# Patient Record
Sex: Female | Born: 1935 | Race: White | Hispanic: No | Smoking: Never smoker
Health system: Southern US, Community
[De-identification: ages and names within clinical notes are randomized; demographics above are authoritative.]

## PROBLEM LIST (undated history)

## (undated) DIAGNOSIS — K921 Melena: Secondary | ICD-10-CM

## (undated) DIAGNOSIS — J301 Allergic rhinitis due to pollen: Secondary | ICD-10-CM

## (undated) DIAGNOSIS — M1990 Unspecified osteoarthritis, unspecified site: Secondary | ICD-10-CM

## (undated) DIAGNOSIS — F32A Depression, unspecified: Secondary | ICD-10-CM

## (undated) DIAGNOSIS — I8393 Asymptomatic varicose veins of bilateral lower extremities: Secondary | ICD-10-CM

## (undated) DIAGNOSIS — K5792 Diverticulitis of intestine, part unspecified, without perforation or abscess without bleeding: Secondary | ICD-10-CM

## (undated) DIAGNOSIS — F329 Major depressive disorder, single episode, unspecified: Secondary | ICD-10-CM

## (undated) DIAGNOSIS — I519 Heart disease, unspecified: Secondary | ICD-10-CM

## (undated) DIAGNOSIS — C801 Malignant (primary) neoplasm, unspecified: Secondary | ICD-10-CM

## (undated) DIAGNOSIS — E785 Hyperlipidemia, unspecified: Secondary | ICD-10-CM

## (undated) DIAGNOSIS — K219 Gastro-esophageal reflux disease without esophagitis: Secondary | ICD-10-CM

## (undated) DIAGNOSIS — B019 Varicella without complication: Secondary | ICD-10-CM

## (undated) DIAGNOSIS — R011 Cardiac murmur, unspecified: Secondary | ICD-10-CM

## (undated) DIAGNOSIS — N390 Urinary tract infection, site not specified: Secondary | ICD-10-CM

## (undated) DIAGNOSIS — K635 Polyp of colon: Secondary | ICD-10-CM

## (undated) HISTORY — DX: Melena: K92.1

## (undated) HISTORY — DX: Hyperlipidemia, unspecified: E78.5

## (undated) HISTORY — PX: KNEE SURGERY: SHX244

## (undated) HISTORY — DX: Gastro-esophageal reflux disease without esophagitis: K21.9

## (undated) HISTORY — DX: Cardiac murmur, unspecified: R01.1

## (undated) HISTORY — DX: Varicella without complication: B01.9

## (undated) HISTORY — DX: Malignant (primary) neoplasm, unspecified: C80.1

## (undated) HISTORY — DX: Heart disease, unspecified: I51.9

## (undated) HISTORY — DX: Urinary tract infection, site not specified: N39.0

## (undated) HISTORY — DX: Diverticulitis of intestine, part unspecified, without perforation or abscess without bleeding: K57.92

## (undated) HISTORY — DX: Polyp of colon: K63.5

## (undated) HISTORY — PX: HERNIA REPAIR: SHX51

## (undated) HISTORY — DX: Unspecified osteoarthritis, unspecified site: M19.90

## (undated) HISTORY — DX: Allergic rhinitis due to pollen: J30.1

## (undated) HISTORY — DX: Depression, unspecified: F32.A

## (undated) HISTORY — DX: Major depressive disorder, single episode, unspecified: F32.9

---

## 2004-06-16 ENCOUNTER — Other Ambulatory Visit: Payer: Self-pay

## 2004-09-29 ENCOUNTER — Ambulatory Visit: Payer: Self-pay | Admitting: Internal Medicine

## 2005-12-06 ENCOUNTER — Ambulatory Visit: Payer: Self-pay | Admitting: Internal Medicine

## 2006-12-22 ENCOUNTER — Ambulatory Visit: Payer: Self-pay | Admitting: Internal Medicine

## 2007-04-04 ENCOUNTER — Ambulatory Visit: Payer: Self-pay | Admitting: Orthopedic Surgery

## 2007-06-13 ENCOUNTER — Ambulatory Visit: Payer: Self-pay | Admitting: Orthopedic Surgery

## 2007-06-21 ENCOUNTER — Inpatient Hospital Stay: Payer: Self-pay | Admitting: Orthopedic Surgery

## 2007-06-28 ENCOUNTER — Encounter: Payer: Self-pay | Admitting: Internal Medicine

## 2007-07-19 ENCOUNTER — Inpatient Hospital Stay: Payer: Self-pay | Admitting: Internal Medicine

## 2007-07-19 ENCOUNTER — Other Ambulatory Visit: Payer: Self-pay

## 2007-08-09 ENCOUNTER — Encounter: Payer: Self-pay | Admitting: Orthopedic Surgery

## 2007-08-26 ENCOUNTER — Encounter: Payer: Self-pay | Admitting: Orthopedic Surgery

## 2007-09-25 ENCOUNTER — Encounter: Payer: Self-pay | Admitting: Orthopedic Surgery

## 2010-01-26 ENCOUNTER — Emergency Department: Payer: Self-pay | Admitting: Emergency Medicine

## 2010-02-06 ENCOUNTER — Ambulatory Visit: Payer: Self-pay | Admitting: Internal Medicine

## 2010-04-06 ENCOUNTER — Ambulatory Visit: Payer: Self-pay | Admitting: Surgery

## 2010-04-13 ENCOUNTER — Ambulatory Visit: Payer: Self-pay | Admitting: Surgery

## 2010-04-16 ENCOUNTER — Encounter: Payer: Self-pay | Admitting: Internal Medicine

## 2010-04-24 ENCOUNTER — Encounter: Payer: Self-pay | Admitting: Internal Medicine

## 2011-12-13 ENCOUNTER — Ambulatory Visit: Payer: Self-pay | Admitting: Internal Medicine

## 2011-12-24 ENCOUNTER — Ambulatory Visit: Payer: Self-pay | Admitting: Internal Medicine

## 2011-12-24 LAB — RETICULOCYTES: Reticulocyte: 2.2 % — ABNORMAL HIGH (ref 0.5–1.5)

## 2011-12-24 LAB — CBC CANCER CENTER
Basophil %: 0.8 %
Eosinophil %: 4.5 %
HCT: 27.8 % — ABNORMAL LOW (ref 35.0–47.0)
HGB: 8.8 g/dL — ABNORMAL LOW (ref 12.0–16.0)
MCH: 21 pg — ABNORMAL LOW (ref 26.0–34.0)
MCV: 67 fL — ABNORMAL LOW (ref 80–100)
Monocyte #: 0.7 x10 3/mm (ref 0.0–0.7)
Monocyte %: 9.8 %
RBC: 4.16 10*6/uL (ref 3.80–5.20)

## 2011-12-24 LAB — IRON AND TIBC
Iron Bind.Cap.(Total): 483 ug/dL — ABNORMAL HIGH (ref 250–450)
Iron: 19 ug/dL — ABNORMAL LOW (ref 50–170)
Unbound Iron-Bind.Cap.: 464 ug/dL

## 2011-12-24 LAB — CREATININE, SERUM
Creatinine: 0.98 mg/dL (ref 0.60–1.30)
EGFR (Non-African Amer.): 59 — ABNORMAL LOW

## 2011-12-24 LAB — FERRITIN: Ferritin (ARMC): 12 ng/mL (ref 8–388)

## 2012-01-24 ENCOUNTER — Ambulatory Visit: Payer: Self-pay | Admitting: Internal Medicine

## 2012-02-04 LAB — CANCER CENTER HEMOGLOBIN: HGB: 11.6 g/dL — ABNORMAL LOW (ref 12.0–16.0)

## 2012-02-23 ENCOUNTER — Ambulatory Visit: Payer: Self-pay | Admitting: Internal Medicine

## 2012-02-25 LAB — CANCER CENTER HEMOGLOBIN: HGB: 11 g/dL — ABNORMAL LOW (ref 12.0–16.0)

## 2012-03-15 LAB — CANCER CENTER HEMOGLOBIN: HGB: 9 g/dL — ABNORMAL LOW (ref 12.0–16.0)

## 2012-03-22 LAB — CANCER CENTER HEMOGLOBIN: HGB: 9.7 g/dL — ABNORMAL LOW (ref 12.0–16.0)

## 2012-03-25 ENCOUNTER — Ambulatory Visit: Payer: Self-pay | Admitting: Internal Medicine

## 2012-05-04 ENCOUNTER — Ambulatory Visit: Payer: Self-pay | Admitting: Internal Medicine

## 2012-05-04 LAB — CANCER CENTER HEMOGLOBIN: HGB: 9.5 g/dL — ABNORMAL LOW (ref 12.0–16.0)

## 2012-05-15 LAB — CANCER CENTER HEMOGLOBIN: HGB: 10.5 g/dL — ABNORMAL LOW (ref 12.0–16.0)

## 2012-05-22 LAB — CANCER CENTER HEMOGLOBIN: HGB: 11.7 g/dL — ABNORMAL LOW (ref 12.0–16.0)

## 2012-05-25 ENCOUNTER — Ambulatory Visit: Payer: Self-pay | Admitting: Internal Medicine

## 2012-06-19 LAB — FERRITIN: Ferritin (ARMC): 55 ng/mL (ref 8–388)

## 2012-06-25 ENCOUNTER — Ambulatory Visit: Payer: Self-pay | Admitting: Internal Medicine

## 2012-07-03 LAB — FERRITIN: Ferritin (ARMC): 33 ng/mL (ref 8–388)

## 2012-07-25 ENCOUNTER — Ambulatory Visit: Payer: Self-pay | Admitting: Internal Medicine

## 2012-08-14 LAB — URINALYSIS, COMPLETE
Glucose,UR: NEGATIVE mg/dL (ref 0–75)
Ketone: NEGATIVE
Nitrite: NEGATIVE
Specific Gravity: 1.011 (ref 1.003–1.030)
Squamous Epithelial: <1
WBC UR: 912 /HPF (ref 0–5)

## 2012-08-14 LAB — CANCER CENTER HEMOGLOBIN: HGB: 11.9 g/dL — ABNORMAL LOW (ref 12.0–16.0)

## 2012-08-16 LAB — URINE CULTURE

## 2012-08-25 ENCOUNTER — Ambulatory Visit: Payer: Self-pay | Admitting: Internal Medicine

## 2012-09-15 LAB — FERRITIN: Ferritin (ARMC): 25 ng/mL (ref 8–388)

## 2012-09-24 ENCOUNTER — Ambulatory Visit: Payer: Self-pay | Admitting: Internal Medicine

## 2012-10-30 ENCOUNTER — Ambulatory Visit: Payer: Self-pay | Admitting: Internal Medicine

## 2012-10-30 LAB — CANCER CENTER HEMOGLOBIN: HGB: 10 g/dL — ABNORMAL LOW (ref 12.0–16.0)

## 2012-11-09 LAB — CANCER CENTER HEMOGLOBIN: HGB: 12 g/dL (ref 12.0–16.0)

## 2012-11-25 ENCOUNTER — Ambulatory Visit: Payer: Self-pay | Admitting: Internal Medicine

## 2012-11-27 LAB — CANCER CENTER HEMOGLOBIN: HGB: 12.2 g/dL (ref 12.0–16.0)

## 2012-12-11 LAB — FERRITIN: Ferritin (ARMC): 41 ng/mL (ref 8–388)

## 2012-12-23 ENCOUNTER — Ambulatory Visit: Payer: Self-pay | Admitting: Internal Medicine

## 2012-12-30 ENCOUNTER — Emergency Department: Payer: Self-pay | Admitting: Emergency Medicine

## 2012-12-30 LAB — CBC
HCT: 33.9 % — ABNORMAL LOW (ref 35.0–47.0)
HGB: 11.3 g/dL — ABNORMAL LOW (ref 12.0–16.0)
MCH: 28.7 pg (ref 26.0–34.0)
RBC: 3.95 10*6/uL (ref 3.80–5.20)
RDW: 14.9 % — ABNORMAL HIGH (ref 11.5–14.5)
WBC: 9.7 10*3/uL (ref 3.6–11.0)

## 2012-12-30 LAB — COMPREHENSIVE METABOLIC PANEL
Albumin: 3.6 g/dL (ref 3.4–5.0)
Alkaline Phosphatase: 62 U/L (ref 50–136)
Anion Gap: 9 (ref 7–16)
BUN: 18 mg/dL (ref 7–18)
Calcium, Total: 9 mg/dL (ref 8.5–10.1)
Chloride: 103 mmol/L (ref 98–107)
Co2: 26 mmol/L (ref 21–32)
EGFR (African American): 58 — ABNORMAL LOW
EGFR (Non-African Amer.): 50 — ABNORMAL LOW
Potassium: 3.6 mmol/L (ref 3.5–5.1)
SGOT(AST): 27 U/L (ref 15–37)
SGPT (ALT): 22 U/L (ref 12–78)
Total Protein: 6.8 g/dL (ref 6.4–8.2)

## 2013-01-08 LAB — CANCER CENTER HEMOGLOBIN: HGB: 9.3 g/dL — ABNORMAL LOW (ref 12.0–16.0)

## 2013-01-08 LAB — RETICULOCYTES
Absolute Retic Count: 0.1901 10*6/uL — ABNORMAL HIGH
Reticulocyte: 5.76 % — ABNORMAL HIGH

## 2013-01-08 LAB — FERRITIN: Ferritin (ARMC): 47 ng/mL (ref 8–388)

## 2013-01-10 LAB — CBC CANCER CENTER
Eosinophil #: 0.4 x10 3/mm (ref 0.0–0.7)
Eosinophil %: 4 %
HCT: 27.8 % — ABNORMAL LOW (ref 35.0–47.0)
HGB: 9.1 g/dL — ABNORMAL LOW (ref 12.0–16.0)
Lymphocyte #: 1.6 x10 3/mm (ref 1.0–3.6)
MCHC: 32.8 g/dL (ref 32.0–36.0)
MCV: 86 fL (ref 80–100)
Monocyte %: 10.6 %
Neutrophil #: 6.4 x10 3/mm (ref 1.4–6.5)
Platelet: 258 x10 3/mm (ref 150–440)
RBC: 3.23 10*6/uL — ABNORMAL LOW (ref 3.80–5.20)
RDW: 15.3 % — ABNORMAL HIGH (ref 11.5–14.5)

## 2013-01-15 LAB — CANCER CENTER HEMOGLOBIN: HGB: 10.5 g/dL — ABNORMAL LOW (ref 12.0–16.0)

## 2013-01-23 ENCOUNTER — Ambulatory Visit: Payer: Self-pay | Admitting: Internal Medicine

## 2013-01-24 LAB — CANCER CENTER HEMOGLOBIN: HGB: 10.7 g/dL — ABNORMAL LOW (ref 12.0–16.0)

## 2013-02-05 LAB — CANCER CENTER HEMOGLOBIN: HGB: 11.5 g/dL — ABNORMAL LOW (ref 12.0–16.0)

## 2013-02-05 LAB — FERRITIN: Ferritin (ARMC): 71 ng/mL (ref 8–388)

## 2013-02-20 ENCOUNTER — Inpatient Hospital Stay: Payer: Self-pay | Admitting: Internal Medicine

## 2013-02-20 LAB — COMPREHENSIVE METABOLIC PANEL
Anion Gap: 8 (ref 7–16)
BUN: 12 mg/dL (ref 7–18)
Bilirubin,Total: 0.4 mg/dL (ref 0.2–1.0)
Chloride: 103 mmol/L (ref 98–107)
Co2: 29 mmol/L (ref 21–32)
Creatinine: 0.98 mg/dL (ref 0.60–1.30)
Glucose: 110 mg/dL — ABNORMAL HIGH (ref 65–99)
Osmolality: 280 (ref 275–301)
Potassium: 4 mmol/L (ref 3.5–5.1)
SGOT(AST): 19 U/L (ref 15–37)
SGPT (ALT): 20 U/L (ref 12–78)
Sodium: 140 mmol/L (ref 136–145)

## 2013-02-20 LAB — PROTIME-INR: Prothrombin Time: 13 secs (ref 11.5–14.7)

## 2013-02-20 LAB — FERRITIN: Ferritin (ARMC): 39 ng/mL (ref 8–388)

## 2013-02-21 LAB — HEMOGLOBIN: HGB: 10 g/dL — ABNORMAL LOW (ref 12.0–16.0)

## 2013-02-22 ENCOUNTER — Ambulatory Visit: Payer: Self-pay | Admitting: Internal Medicine

## 2013-02-23 LAB — HEMOGLOBIN
HGB: 9.5 g/dL — ABNORMAL LOW (ref 12.0–16.0)
HGB: 10 g/dL — ABNORMAL LOW (ref 12.0–16.0)

## 2013-02-23 LAB — PATHOLOGY REPORT

## 2013-03-05 LAB — CANCER CENTER HEMOGLOBIN: HGB: 11.5 g/dL — ABNORMAL LOW (ref 12.0–16.0)

## 2013-03-15 LAB — FERRITIN: Ferritin (ARMC): 123 ng/mL (ref 8–388)

## 2013-03-25 ENCOUNTER — Ambulatory Visit: Payer: Self-pay | Admitting: Internal Medicine

## 2013-03-27 ENCOUNTER — Ambulatory Visit: Payer: Self-pay | Admitting: Ophthalmology

## 2013-03-29 LAB — CANCER CENTER HEMOGLOBIN: HGB: 12.3 g/dL (ref 12.0–16.0)

## 2013-04-12 LAB — CANCER CENTER HEMOGLOBIN: HGB: 12.6 g/dL (ref 12.0–16.0)

## 2013-04-24 ENCOUNTER — Ambulatory Visit: Payer: Self-pay | Admitting: Internal Medicine

## 2013-05-10 LAB — CANCER CENTER HEMOGLOBIN: HGB: 12.2 g/dL (ref 12.0–16.0)

## 2013-05-23 LAB — FERRITIN: Ferritin (ARMC): 27 ng/mL (ref 8–388)

## 2013-05-23 LAB — CANCER CENTER HEMOGLOBIN: HGB: 11.9 g/dL — ABNORMAL LOW (ref 12.0–16.0)

## 2013-05-25 ENCOUNTER — Ambulatory Visit: Payer: Self-pay | Admitting: Internal Medicine

## 2013-06-14 LAB — FERRITIN: Ferritin (ARMC): 22 ng/mL (ref 8–388)

## 2013-06-14 LAB — CANCER CENTER HEMOGLOBIN: HGB: 11 g/dL — ABNORMAL LOW (ref 12.0–16.0)

## 2013-06-25 ENCOUNTER — Ambulatory Visit: Payer: Self-pay | Admitting: Internal Medicine

## 2013-07-06 LAB — CANCER CENTER HEMOGLOBIN: HGB: 12.4 g/dL (ref 12.0–16.0)

## 2013-07-25 ENCOUNTER — Ambulatory Visit: Payer: Self-pay | Admitting: Internal Medicine

## 2013-08-08 ENCOUNTER — Emergency Department: Payer: Self-pay | Admitting: Emergency Medicine

## 2013-08-08 LAB — URINALYSIS, COMPLETE
Bacteria: NONE SEEN
Bilirubin,UR: NEGATIVE
Glucose,UR: NEGATIVE mg/dL (ref 0–75)
Leukocyte Esterase: NEGATIVE
Ph: 8 (ref 4.5–8.0)
Protein: NEGATIVE
RBC,UR: <1 /HPF (ref 0–5)

## 2013-08-08 LAB — CBC
HCT: 36.4 % (ref 35.0–47.0)
HGB: 12.6 g/dL (ref 12.0–16.0)
MCHC: 34.7 g/dL (ref 32.0–36.0)
MCV: 83 fL (ref 80–100)
Platelet: 197 10*3/uL (ref 150–440)
RBC: 4.37 10*6/uL (ref 3.80–5.20)
RDW: 16.4 % — ABNORMAL HIGH (ref 11.5–14.5)

## 2013-08-08 LAB — COMPREHENSIVE METABOLIC PANEL
Albumin: 3.8 g/dL (ref 3.4–5.0)
BUN: 13 mg/dL (ref 7–18)
Bilirubin,Total: 0.4 mg/dL (ref 0.2–1.0)
Chloride: 106 mmol/L (ref 98–107)
Creatinine: 0.75 mg/dL (ref 0.60–1.30)
EGFR (African American): >60
EGFR (Non-African Amer.): >60
Glucose: 106 mg/dL — ABNORMAL HIGH (ref 65–99)
Osmolality: 278 (ref 275–301)
SGPT (ALT): 21 U/L (ref 12–78)
Sodium: 139 mmol/L (ref 136–145)
Total Protein: 6.9 g/dL (ref 6.4–8.2)

## 2013-08-08 LAB — TROPONIN I: Troponin-I: <0.02 ng/mL

## 2013-08-20 LAB — CANCER CENTER HEMOGLOBIN: HGB: 11.7 g/dL — ABNORMAL LOW (ref 12.0–16.0)

## 2013-08-25 ENCOUNTER — Ambulatory Visit: Payer: Self-pay | Admitting: Internal Medicine

## 2013-09-17 LAB — CANCER CENTER HEMOGLOBIN: HGB: 12.6 g/dL (ref 12.0–16.0)

## 2013-09-24 ENCOUNTER — Ambulatory Visit: Payer: Self-pay | Admitting: Internal Medicine

## 2013-10-29 ENCOUNTER — Ambulatory Visit: Payer: Self-pay | Admitting: Internal Medicine

## 2013-10-29 LAB — CANCER CENTER HEMOGLOBIN: HGB: 13.2 g/dL (ref 12.0–16.0)

## 2013-10-29 LAB — FERRITIN: FERRITIN (ARMC): 38 ng/mL (ref 8–388)

## 2013-11-15 LAB — CANCER CENTER HEMATOCRIT: HCT: 35.8 % (ref 35.0–47.0)

## 2013-11-15 LAB — FERRITIN: FERRITIN (ARMC): 243 ng/mL (ref 8–388)

## 2013-11-23 LAB — FERRITIN: FERRITIN (ARMC): 152 ng/mL (ref 8–388)

## 2013-11-23 LAB — CBC CANCER CENTER
BASOS ABS: 0.1 x10 3/mm (ref 0.0–0.1)
Basophil %: 0.7 %
Eosinophil #: 0.6 x10 3/mm (ref 0.0–0.7)
Eosinophil %: 6.9 %
HCT: 38.5 % (ref 35.0–47.0)
HGB: 12.5 g/dL (ref 12.0–16.0)
Lymphocyte #: 1.6 x10 3/mm (ref 1.0–3.6)
Lymphocyte %: 17.6 %
MCH: 29 pg (ref 26.0–34.0)
MCHC: 32.4 g/dL (ref 32.0–36.0)
MCV: 89 fL (ref 80–100)
Monocyte #: 0.9 x10 3/mm (ref 0.2–0.9)
Monocyte %: 9.5 %
NEUTROS ABS: 6 x10 3/mm (ref 1.4–6.5)
NEUTROS PCT: 65.3 %
Platelet: 218 x10 3/mm (ref 150–440)
RBC: 4.31 10*6/uL (ref 3.80–5.20)
RDW: 16.1 % — ABNORMAL HIGH (ref 11.5–14.5)
WBC: 9.2 x10 3/mm (ref 3.6–11.0)

## 2013-11-25 ENCOUNTER — Ambulatory Visit: Payer: Self-pay | Admitting: Internal Medicine

## 2013-12-07 LAB — FERRITIN: FERRITIN (ARMC): 73 ng/mL (ref 8–388)

## 2013-12-07 LAB — CANCER CENTER HEMOGLOBIN: HGB: 12.5 g/dL (ref 12.0–16.0)

## 2013-12-19 LAB — CANCER CENTER HEMOGLOBIN: HGB: 13 g/dL (ref 12.0–16.0)

## 2013-12-23 ENCOUNTER — Ambulatory Visit: Payer: Self-pay | Admitting: Internal Medicine

## 2014-01-02 LAB — CANCER CENTER HEMOGLOBIN: HGB: 11.4 g/dL — ABNORMAL LOW (ref 12.0–16.0)

## 2014-01-10 LAB — FERRITIN: FERRITIN (ARMC): 94 ng/mL (ref 8–388)

## 2014-01-10 LAB — CANCER CENTER HEMOGLOBIN: HGB: 12.3 g/dL (ref 12.0–16.0)

## 2014-01-23 ENCOUNTER — Ambulatory Visit: Payer: Self-pay | Admitting: Internal Medicine

## 2014-01-24 LAB — FERRITIN: Ferritin (ARMC): 69 ng/mL (ref 8–388)

## 2014-01-24 LAB — CANCER CENTER HEMOGLOBIN: HGB: 13.1 g/dL (ref 12.0–16.0)

## 2014-02-21 LAB — CANCER CENTER HEMOGLOBIN: HGB: 13.5 g/dL (ref 12.0–16.0)

## 2014-02-21 LAB — FERRITIN: FERRITIN (ARMC): 45 ng/mL (ref 8–388)

## 2014-02-22 ENCOUNTER — Ambulatory Visit: Payer: Self-pay | Admitting: Internal Medicine

## 2014-03-15 ENCOUNTER — Ambulatory Visit: Payer: Self-pay | Admitting: Adult Health

## 2014-03-25 DIAGNOSIS — D649 Anemia, unspecified: Secondary | ICD-10-CM | POA: Insufficient documentation

## 2014-03-25 DIAGNOSIS — I2510 Atherosclerotic heart disease of native coronary artery without angina pectoris: Secondary | ICD-10-CM | POA: Insufficient documentation

## 2014-03-25 DIAGNOSIS — M069 Rheumatoid arthritis, unspecified: Secondary | ICD-10-CM | POA: Insufficient documentation

## 2014-03-25 DIAGNOSIS — E785 Hyperlipidemia, unspecified: Secondary | ICD-10-CM | POA: Insufficient documentation

## 2014-03-25 DIAGNOSIS — K589 Irritable bowel syndrome without diarrhea: Secondary | ICD-10-CM | POA: Insufficient documentation

## 2014-03-25 DIAGNOSIS — I10 Essential (primary) hypertension: Secondary | ICD-10-CM | POA: Insufficient documentation

## 2014-03-25 DIAGNOSIS — F319 Bipolar disorder, unspecified: Secondary | ICD-10-CM | POA: Insufficient documentation

## 2014-03-25 DIAGNOSIS — K219 Gastro-esophageal reflux disease without esophagitis: Secondary | ICD-10-CM | POA: Insufficient documentation

## 2014-04-22 ENCOUNTER — Ambulatory Visit: Payer: Self-pay | Admitting: Internal Medicine

## 2014-04-22 LAB — CBC CANCER CENTER
BASOS ABS: 0.1 x10 3/mm (ref 0.0–0.1)
Basophil %: 0.7 %
EOS PCT: 7.8 %
Eosinophil #: 0.7 x10 3/mm (ref 0.0–0.7)
HCT: 36.3 % (ref 35.0–47.0)
HGB: 12.1 g/dL (ref 12.0–16.0)
LYMPHS ABS: 1.2 x10 3/mm (ref 1.0–3.6)
LYMPHS PCT: 14.4 %
MCH: 29.6 pg (ref 26.0–34.0)
MCHC: 33.2 g/dL (ref 32.0–36.0)
MCV: 89 fL (ref 80–100)
Monocyte #: 0.8 x10 3/mm (ref 0.2–0.9)
Monocyte %: 9.1 %
Neutrophil #: 5.8 x10 3/mm (ref 1.4–6.5)
Neutrophil %: 68 %
Platelet: 176 x10 3/mm (ref 150–440)
RBC: 4.08 10*6/uL (ref 3.80–5.20)
RDW: 14.6 % — ABNORMAL HIGH (ref 11.5–14.5)
WBC: 8.5 x10 3/mm (ref 3.6–11.0)

## 2014-04-22 LAB — CREATININE, SERUM
Creatinine: 0.83 mg/dL (ref 0.60–1.30)
EGFR (African American): >60
EGFR (Non-African Amer.): >60

## 2014-04-22 LAB — HEPATIC FUNCTION PANEL A (ARMC)
Albumin: 3.8 g/dL (ref 3.4–5.0)
Alkaline Phosphatase: 68 U/L
BILIRUBIN TOTAL: 0.3 mg/dL (ref 0.2–1.0)
Bilirubin, Direct: <0.1 mg/dL (ref 0.00–0.20)
SGOT(AST): 17 U/L (ref 15–37)
SGPT (ALT): 24 U/L (ref 12–78)
Total Protein: 6.7 g/dL (ref 6.4–8.2)

## 2014-04-22 LAB — FERRITIN: Ferritin (ARMC): 72 ng/mL (ref 8–388)

## 2014-04-22 LAB — SEDIMENTATION RATE: Erythrocyte Sed Rate: 17 mm/hr (ref 0–30)

## 2014-04-24 ENCOUNTER — Ambulatory Visit: Payer: Self-pay | Admitting: Internal Medicine

## 2014-05-20 LAB — FERRITIN: Ferritin (ARMC): 134 ng/mL (ref 8–388)

## 2014-05-20 LAB — CANCER CENTER HEMOGLOBIN: HGB: 11.4 g/dL — ABNORMAL LOW (ref 12.0–16.0)

## 2014-05-25 ENCOUNTER — Ambulatory Visit: Payer: Self-pay | Admitting: Internal Medicine

## 2014-05-29 LAB — IRON AND TIBC
Iron Bind.Cap.(Total): 336 ug/dL (ref 250–450)
Iron Saturation: 27 %
Iron: 92 ug/dL (ref 50–170)
UNBOUND IRON-BIND. CAP.: 244 ug/dL

## 2014-05-29 LAB — FERRITIN: Ferritin (ARMC): 86 ng/mL (ref 8–388)

## 2014-05-29 LAB — CANCER CENTER HEMOGLOBIN: HGB: 11 g/dL — AB (ref 12.0–16.0)

## 2014-06-07 LAB — CANCER CENTER HEMOGLOBIN: HGB: 12.4 g/dL (ref 12.0–16.0)

## 2014-06-25 ENCOUNTER — Ambulatory Visit: Payer: Self-pay | Admitting: Internal Medicine

## 2014-11-27 ENCOUNTER — Encounter: Payer: Self-pay | Admitting: Nurse Practitioner

## 2014-11-27 ENCOUNTER — Ambulatory Visit (INDEPENDENT_AMBULATORY_CARE_PROVIDER_SITE_OTHER): Payer: Medicare Other | Admitting: Nurse Practitioner

## 2014-11-27 VITALS — BP 132/60 | HR 82 | Temp 97.3°F | Resp 14 | Ht 62.5 in | Wt 182.1 lb

## 2014-11-27 DIAGNOSIS — G894 Chronic pain syndrome: Secondary | ICD-10-CM

## 2014-11-27 DIAGNOSIS — R5383 Other fatigue: Secondary | ICD-10-CM

## 2014-11-27 DIAGNOSIS — I10 Essential (primary) hypertension: Secondary | ICD-10-CM | POA: Diagnosis not present

## 2014-11-27 DIAGNOSIS — R7989 Other specified abnormal findings of blood chemistry: Secondary | ICD-10-CM

## 2014-11-27 DIAGNOSIS — E669 Obesity, unspecified: Secondary | ICD-10-CM

## 2014-11-27 DIAGNOSIS — Z0000 Encounter for general adult medical examination without abnormal findings: Secondary | ICD-10-CM | POA: Diagnosis not present

## 2014-11-27 LAB — LIPID PANEL
CHOL/HDL RATIO: 7
Cholesterol: 325 mg/dL — ABNORMAL HIGH (ref 0–200)
HDL: 47.8 mg/dL (ref >39.00)
NonHDL: 277.2
Triglycerides: 289 mg/dL — ABNORMAL HIGH (ref 0.0–149.0)
VLDL: 57.8 mg/dL — ABNORMAL HIGH (ref 0.0–40.0)

## 2014-11-27 LAB — CBC WITH DIFFERENTIAL/PLATELET
Basophils Absolute: 0 10*3/uL (ref 0.0–0.1)
Basophils Relative: 0.5 % (ref 0.0–3.0)
Eosinophils Absolute: 0.3 10*3/uL (ref 0.0–0.7)
Eosinophils Relative: 4.4 % (ref 0.0–5.0)
HCT: 30.2 % — ABNORMAL LOW (ref 36.0–46.0)
Hemoglobin: 9.6 g/dL — ABNORMAL LOW (ref 12.0–15.0)
Lymphocytes Relative: 16.2 % (ref 12.0–46.0)
Lymphs Abs: 1.2 10*3/uL (ref 0.7–4.0)
MCHC: 31.8 g/dL (ref 30.0–36.0)
MCV: 70.7 fl — ABNORMAL LOW (ref 78.0–100.0)
MONOS PCT: 9.1 % (ref 3.0–12.0)
Monocytes Absolute: 0.7 10*3/uL (ref 0.1–1.0)
Neutro Abs: 5.3 10*3/uL (ref 1.4–7.7)
Neutrophils Relative %: 69.8 % (ref 43.0–77.0)
PLATELETS: 228 10*3/uL (ref 150.0–400.0)
RBC: 4.28 Mil/uL (ref 3.87–5.11)
RDW: 16.9 % — ABNORMAL HIGH (ref 11.5–15.5)
WBC: 7.6 10*3/uL (ref 4.0–10.5)

## 2014-11-27 LAB — COMPREHENSIVE METABOLIC PANEL
ALT: 16 U/L (ref 0–35)
AST: 21 U/L (ref 0–37)
Albumin: 4.3 g/dL (ref 3.5–5.2)
Alkaline Phosphatase: 60 U/L (ref 39–117)
BUN: 18 mg/dL (ref 6–23)
CHLORIDE: 103 meq/L (ref 96–112)
CO2: 27 mEq/L (ref 19–32)
Calcium: 9.6 mg/dL (ref 8.4–10.5)
Creatinine, Ser: 0.84 mg/dL (ref 0.40–1.20)
GFR: 69.96 mL/min (ref >60.00)
GLUCOSE: 118 mg/dL — AB (ref 70–99)
POTASSIUM: 4 meq/L (ref 3.5–5.1)
Sodium: 136 mEq/L (ref 135–145)
Total Bilirubin: 0.4 mg/dL (ref 0.2–1.2)
Total Protein: 6.5 g/dL (ref 6.0–8.3)

## 2014-11-27 LAB — LDL CHOLESTEROL, DIRECT: LDL DIRECT: 226 mg/dL

## 2014-11-27 LAB — TSH: TSH: 1.44 u[IU]/mL (ref 0.35–4.50)

## 2014-11-27 NOTE — Progress Notes (Signed)
   Subjective:    Patient ID: Cindy Fitzpatrick, female    DOB: 1935/12/25, 79 y.o.   MRN: [id number]  HPI  Cindy Fitzpatrick is a 79 yo female here to establish care.   1) Health Maintenance-   Diet- Eating vegetables, meat daily, yogurt and apple sauce.   Exercise- No formal  Immunizations- Awaiting records "Pneumonia shot 2015"  Mammogram- Awaiting records  Pap- N/A  Bone Density- In past awaiting records  Eye Exam-1 cataract removed already  Dental Exam- Not up to date No exam in 2-3 years, no blood work in 2 years   2) Chronic Problems-  Heart catherization  Bilateral carpal tunnels   Ruptured disk   Plantar fasciatis and dropped arch- Right foot  3) Acute Problems-   GI- Dr. Allen Norris   Review of Systems  Constitutional: Positive for diaphoresis. Negative for fever, chills and fatigue.       Infrequent sweats  HENT: Negative for tinnitus and trouble swallowing.   Eyes: Positive for visual disturbance.       Cataract left removed, right is to be removed  Respiratory: Negative for cough and chest tightness.   Cardiovascular: Negative for chest pain, palpitations and leg swelling.  Gastrointestinal: Positive for constipation. Negative for nausea, vomiting and diarrhea.       Hernias and GERD, See's Dr. Allen Norris  Skin: Negative for rash.  Neurological: Negative for dizziness and headaches.  Psychiatric/Behavioral: Negative for suicidal ideas and confusion.       Psychiatrist every week       Objective:   Physical Exam  Constitutional: She is oriented to person, place, and time. She appears well-developed and well-nourished. No distress.  BP 132/60 mmHg  Pulse 82  Temp(Src) 97.3 F (36.3 C)  Resp 14  Ht 5' 2.5" (1.588 m)  Wt 182 lb 1.9 oz (82.609 kg)  BMI 32.76 kg/m2  SpO2 98%   HENT:  Head: Normocephalic and atraumatic.  Right Ear: External ear normal.  Left Ear: External ear normal.  Nose: Nose normal.  Mouth/Throat: Oropharynx is clear and moist. No oropharyngeal exudate.    Bilateral hearing aides  Eyes: Conjunctivae and EOM are normal. Pupils are equal, round, and reactive to light. Right eye exhibits no discharge. Left eye exhibits no discharge. No scleral icterus.  Neck: Normal range of motion. Neck supple. No thyromegaly present.  Cardiovascular: Normal rate, regular rhythm, normal heart sounds and intact distal pulses.  Exam reveals no gallop and no friction rub.   No murmur heard. Pulmonary/Chest: Effort normal and breath sounds normal. No respiratory distress. She has no wheezes. She has no rales. She exhibits no tenderness.  Abdominal: Soft. Bowel sounds are normal. She exhibits no distension and no mass. There is no tenderness. There is no rebound and no guarding.  Musculoskeletal: Normal range of motion. She exhibits no edema or tenderness.  Walks with rolling walker  Lymphadenopathy:    She has no cervical adenopathy.  Neurological: She is alert and oriented to person, place, and time. She has normal reflexes. No cranial nerve deficit. She exhibits normal muscle tone. Coordination normal.  Skin: Skin is warm and dry. No rash noted. She is not diaphoretic. No erythema. No pallor.  Psychiatric: She has a normal mood and affect. Her behavior is normal. Judgment and thought content normal.      Assessment & Plan:

## 2014-11-27 NOTE — Patient Instructions (Signed)
Please follow up in 3 months.   Visit the lab before leaving today.   We will contact you with results.   Welcome to [facility]!

## 2014-12-01 ENCOUNTER — Encounter: Payer: Self-pay | Admitting: Nurse Practitioner

## 2014-12-01 DIAGNOSIS — Z0000 Encounter for general adult medical examination without abnormal findings: Secondary | ICD-10-CM | POA: Insufficient documentation

## 2014-12-01 DIAGNOSIS — E669 Obesity, unspecified: Secondary | ICD-10-CM | POA: Insufficient documentation

## 2014-12-01 NOTE — Assessment & Plan Note (Signed)
Pt came equipped with many records that were hand written. We have copies and are waiting to be abstracted into the chart. Obtain TSH, Lipid, CMET, and CBC w/ diff (cmet and lipid non-fasting). Reviewed as much PFSHx as time permitted today. Immunizations were part of the records she brought.

## 2014-12-01 NOTE — Assessment & Plan Note (Signed)
Pt limited physically for exercise. She is afraid to exercise due to hernias and does not want surgery. I discussed possible chair exercises and pt did not seem interested. Food- pt visits the main dining room of her facility once daily for a provided meal. She eats yogurt and apple sauce for other sustenance during the day.

## 2015-01-22 ENCOUNTER — Telehealth: Payer: Self-pay | Admitting: *Deleted

## 2015-01-22 MED ORDER — DILTIAZEM HCL 30 MG PO TABS
30.0000 mg | ORAL_TABLET | Freq: Three times a day (TID) | ORAL | Status: DC
Start: 2015-01-22 — End: 2015-09-23

## 2015-01-22 NOTE — Telephone Encounter (Signed)
Fax from pharmacy requesting Diltiazem HCL 30 mg tab #270.  Take one tablet TID.  As per pts NP OV on 2.3.16 there were no medications recorded.  Please advise refill

## 2015-01-22 NOTE — Telephone Encounter (Signed)
Can refill- I believe she had a written list that was never put into the system of all her meds.

## 2015-01-22 NOTE — Telephone Encounter (Signed)
rx sent

## 2015-02-13 DIAGNOSIS — R5383 Other fatigue: Secondary | ICD-10-CM | POA: Insufficient documentation

## 2015-02-13 DIAGNOSIS — G894 Chronic pain syndrome: Secondary | ICD-10-CM | POA: Insufficient documentation

## 2015-02-13 DIAGNOSIS — I10 Essential (primary) hypertension: Secondary | ICD-10-CM | POA: Insufficient documentation

## 2015-02-13 NOTE — Addendum Note (Signed)
Addended by: Rubbie Battiest on: 02/13/2015 01:20 PM   Modules accepted: Medications

## 2015-02-14 NOTE — Consult Note (Signed)
Brief Consult Note: Diagnosis: Anemia with a lower GI bleed.   Patient was seen by consultant.   Consult note dictated.   Comments: This patient has had chronic iron def anemia. Her last colonoscopy was many years ago. She has had rectal bleeding. The patient will be set up for a colonoscopy for tomorrow.  Electronic Signatures: Lucilla Lame (MD)  (Signed 30-Apr-14 08:45)  Authored: Brief Consult Note   Last Updated: 30-Apr-14 08:45 by Lucilla Lame (MD)

## 2015-02-14 NOTE — Consult Note (Signed)
PATIENT NAME:  Cindy Fitzpatrick, Cindy Fitzpatrick MR#:  [id number] DATE OF BIRTH:  14-Sep-1936  GASTROENTEROLOGY CONSULTATION  DATE OF ADMISSION:  02/20/2013 DATE OF CONSULTATION:  02/21/2013  CONSULTING SERVICE:  Gastroenterology.  CONSULTING PHYSICIAN:  Lucilla Lame, MD  REASON FOR CONSULTATION:   Lower GI bleed, and anemia.  HISTORY OF PRESENT ILLNESS: This patient is a 79 year old woman who has a past medical history of anemia. The patient has had multiple surgeries in the past for multiple problems including abdominal wall hernias. The patient also reports that she has had multiple surgeries for orthopedic reasons. She now states that she has had rectal bleeding and symptomatic anemia. She has been followed by the [facility] and has received iron in the past. She also reports that she had a colonoscopy around 2005 by Dr. Vira Agar, but has not had any followup colonoscopy since then. She also reports that she has noted bleeding from her rectum for years. It is usually bright red in color. She has been resistant to having a colonoscopy in the past because of the fear of needing some other intervention because of the findings of a colonoscopy. She also does not like having procedures due to the fact that she has had multiple surgeries which she does not think were necessary or helped her. She does report some abdominal distention around the hernia areas and some upper abdominal pain when the hernias protrude. Otherwise she is not having any chronic abdominal pain. There is no report of any fevers, chills, nausea, vomiting, constipation, or diarrhea. She also denies any black stools associated with her bowel movements.   PAST MEDICAL HISTORY: Bipolar disorder, obsessive-compulsive disease, panic disorder, DJD, rheumatoid arthritis, hyperlipidemia, varicose veins, coronary artery disease with angioplasties and stenting, right femoral artery bleed after an invasive procedure, irritable bowel syndrome, gastritis, colon and  gastric polyps, lower extremity vein ligation, cystocele, rectocele, hysterectomy, cholecystectomy, appendectomy, carpal tunnel syndrome.  ALLERGIES: DEMEROL, CODEINE, SULFA, DARVON, NIACIN, RELAFEN, LIPITOR, ZOCOR, HYDROCODONE WITH TYLENOL, PRAVACOL, LIBRAX, AND CRESTOR.   MEDICATIONS: Catapres, Prilosec, diltiazem, hydrochlorothiazide, nitroglycerin, Klonopin, Ultram, calcium and vitamin D, bupropion,   PHYSICAL EXAMINATION:  GENERAL: The patient is in  no apparent distress, sitting up in bed, answering questions appropriately, awake, alert, and oriented x 3. VITAL SIGNS:  Temperature 97.3, pulse 67, respirations 18, blood pressure 123/55, pulse oximetry 95% on 2 liters. HEENT: Normocephalic, atraumatic. Extraocular movements intact. Pupils equally round and reactive to light and accommodation.  NECK: Without JVD, without lymphadenopathy. LUNGS: Clear to auscultation bilaterally.  HEART: Regular rate and rhythm, without murmurs, rubs, or gallops. ABDOMEN: Soft, with multiple hernias, without distention, without rebound, without guarding. EXTREMITIES:  Some trace edema, with no rashes or lesions. No cyanosis. NEUROLOGIC: Grossly intact. SKIN: Without rashes or lesions.  ANCILLARY SERVICES: Hemoglobin on admission was 9.3; after transfusion it was 10.0. Today liver enzymes were normal. The rest of the labs were unremarkable.  ASSESSMENT AND PLAN: This patient is a 79 year old woman with symptomatic anemia and rectal bleeding. The patient will be prepped for a colonoscopy today, the colonoscopy to be done tomorrow. The patient has been explained the risks, benefits, and alternatives, and agrees to having the procedure done.   Thank you very much for involving me in the care of this patient. If you have any questions please do not hesitate to call.     ____________________________ Lucilla Lame, MD dw:dm D: 02/21/2013 08:53:24 ET T: 02/21/2013 09:18:26 ET JOB#: [id number]  cc: Lucilla Lame,  MD, <Dictator> Lucilla Lame MD  ELECTRONICALLY SIGNED 02/21/2013 12:57

## 2015-02-14 NOTE — H&P (Signed)
PATIENT NAME:  Cindy Fitzpatrick, EICK MR#:  [id number] DATE OF BIRTH:  09/22/1936  DATE OF ADMISSION:  02/20/2013  The patient is a 79 year old patient seen in the [place]. Was admitted with symptomatic anemia and lower GI bleeding. This patient has chronic problems including chronic recurrent iron deficiency, unresponsive p.o. iron, dependent on intravenous iron. The patient has had intravenous iron on a p.r.n. basis.   The patient called and was asked to come in with the complaint that daily, for a few weeks, the patient has had fresh red blood in stools and fresh red blood, even in the absence of bowel movements, urge to sit in the toilet passing fresh blood, sometimes a few teaspoons, sometime slightly more voluminous. No cramps. No mucus. No lower abdominal pain. The patient has increasing weakness. She reports that she is short of breath just on walking with minimal exertion. She was short of breath last night. She felt that her heart was a bit fast, although she is not currently having palpitations. She has not noted any irregular heartbeat. She has not chest pain. She does not have any current dizziness at rest, but has had orthostasis. No cough, no wheezing. No headache. No rash. No bruising. No increased extremity edema.   PAST HISTORY: Includes multiple illnesses, bipolar disorder, obsessive-compulsive disorder, panic disorder, degenerative joint disease, rheumatoid arthritis, hyperlipidemia, varicose veins, coronary artery disease with angioplasties and stenting, right femoral artery prior bleed after invasive procedure, irritable bowel syndrome, gastritis, colon and gastric polyps, lower extremity vein ligation, repair of cystocele and rectocele, hysterectomy, cholecystectomy, appendectomy, arthroscopy and tibial osteotomy, carpal tunnel syndrome, repair of right femoral artery tear, and knee surgeries.   ALLERGIES: TO: DEMEROL, CODEINE, SULFA, DARVON, NIACIN, RELAFEN, LIPITOR, ZOCOR, HYDROCODONE  WITH TYLENOL, PRAVACHOL, LIBRAX AND CRESTOR.   The patient at home takes: Catapres 0.05 mg 1 tablet daily at lunch, Prilosec 20 mg, 1 b.i.d., diltiazem 30 mg p.o. t.i.d., hydrochlorothiazide 25 mg daily, nitroglycerin 4 mg patch once a day, Refresh Eyedrops 1 drop every three hours each eye, regular Tylenol p.r.n. for pain, Klonopin 0.5 mg, 1 at lunch, 1 in the morning, afternoon and evening p.r.n. for anxiety. Has been recommended for amoxicillin before face procedures. Zoloft 150 mg at bedtime, Catapres 0.1 mg at bedtime, nitroglycerin sublingual p.r.n., Ultram 50 mg every 6 hours p.r.n., bupropion  150 mg, 1 twice a day, calcium and vitamin D twice a day, oral iron twice a day, Centrum vitamin daily.   PHYSICAL EXAMINATION: On exam this an alert and cooperative, anxious, with obvious pallor, short of breath. No jaundice or sclerae.   LYMPH: No palpable lymph nodes in the neck, supraclavicular, submandibular, or axilla.  HEART: Regular.  ABDOMEN: Nontender. No palpable mass or organomegaly, but abdominal protuberance in the mid-abdomen; abdominal weakness. Not discolored. Not tender.  EXTREMITIES: Trace symmetric edema. No calf tenderness. No rash or bruising. No gross focal weakness. Cooperative, anxious.   LABS: The hemoglobin is 9.3. Note that 2 weeks ago hemoglobin was 11.3. Ferritin was 39.   IMPRESSION AND PLAN: Patient  with recent and persistent bright red blood per rectum, and had persistent symptoms before seeking medical attention. Previously and recently, intravenous iron. Had a good response and loosen up the hemoglobin. Currently the patient is symptomatic of anemia. Was not having bleeding this morning but was passing fresh blood as recently as last night.   With symptomatic anemia with a recent active blood loss, even a hemoglobin at 9.3, underlying heart disease and shortness  of breath, it is appropriate to initially plan for a packed red blood cell transfusion, 1 unit, and then  ascertain clinical response. I will admit the patient and continue her usual medicines but limit diet to medicines and sips of water for the potential for some invasive investigation.   Consult surgery. She has seen Dr. Pat Patrick in the past. Start a KVO IV. Watch the blood pressure. Document a baseline EKG, with heart disease and shortness of breath. Put the patient on telemetry. Document the full metabolic panel and check the protime. When available, 1 unit of blood, then plan intravenous iron support with Feraheme and mention the evaluation and possible interventions as per surgery.    ____________________________ Simonne Come Inez Pilgrim, MD rgg:dm D: 02/20/2013 13:15:11 ET T: 02/20/2013 14:27:53 ET JOB#: [id number]  cc: Simonne Come. Inez Pilgrim, MD, <Dictator> Dallas Schimke MD ELECTRONICALLY SIGNED 03/07/2013 20:28

## 2015-02-14 NOTE — Discharge Summary (Signed)
PATIENT NAME:  Cindy Fitzpatrick, Cindy Fitzpatrick MR#:  [id number] DATE OF BIRTH:  06-21-1936  DATE OF ADMISSION:  02/20/2013  DATE OF DISCHARGE:  02/23/2013  FINAL DIAGNOSES: 1.  Rectal bleeding, source may have been hemorrhoids or proctitis.  2.  Internal hemorrhoids.  3.  Proctitis.   PROCEDURES:   1.  One unit of blood was transfused.  2.  Colonoscopy.   CONSULTATION:  With GI, Dr. Verl Blalock, and surgery, Dr. Geryl Rankins.   HISTORY AND PHYSICAL:  Dictated on admission.   HOSPITAL COURSE:  A patient who was initially admitted with anemia and rectal bleeding and symptomatic, was given a unit of blood and hemoglobin was up to 10 grams after the transfusion. Was then followed without additional transfusion. Was seen by GI and then a colonoscopy was performed on May 1st. Serial hemoglobins were monitored and they were stable. Waxing and waning abdominal discomfort was noted that is attributed to an abdominal hernia. At colonoscopy, hemorrhoids were noted, also some proctitis. Biopsies were performed. Surgery was consulted to evaluate the hemorrhoids and the abdominal hernia, although the abdomen clinically on exam was benign and nonsurgical. On May 2nd, the patient was stable for discharge. Hemoglobin had remained essentially steady with slight waxing and waning. Energy level was better. The patient was stronger. There was no active bleeding. The biopsies from proctitis revealed possibly ischemic, but as discussed with surgery, there is no indication for interventions, and no evidence of other bowel pathology.   A dose of ferriheme, as was initially planned to supplement transfusion in a patient with subacute and chronic blood the was really an iron deficiency, was given 5 to 10 mg IV the day of discharge, and the patient was then discharged with followup to see me in 3 days, and surgical outpatient followup also planned.   MEDICATIONS AT THE TIME OF DISCHARGE: Calcium and vitamin D 1 tablet twice a day, Centrum vitamin once a  day, Zoloft 20 mg at night, Refresh eye drops 1 drop to each eye p.r.n., fish oil 1 capsule twice a day, Klonopin 0.5 mg 1 in the morning, 1 at midday and 2 at dinner; bupropion 150 mg 1 tablet daily, Colace 100 mg 1 tablet twice a day, clonidine 0.1 mg 1 twice a day, sublingual nitroglycerin p.r.n., diltiazem 30 mg 1 p.o. every 8 hours, hydrochlorothiazide 25 mg 1 a day in the morning, hydrocortisone acetate suppository, which was a new treatment, 1 rectally twice a day; also omeprazole 20 mg 1 p.o. twice a day.   DISCHARGE PLAN:  The patient was to stop aspirin, which was a prior medication.   The patient went home with followup as already reviewed to be within approximately 3 days, see me in the [place]. No exertional activity, no heavy lifting, was on a regular diet without any oxygen.     ____________________________ Simonne Come Inez Pilgrim, MD rgg:dmm D: 03/07/2013 20:40:39 ET T: 03/07/2013 21:09:06 ET JOB#: [id number]  cc: Simonne Come. Inez Pilgrim, MD, <Dictator> Dallas Schimke MD ELECTRONICALLY SIGNED 03/12/2013 14:24

## 2015-02-27 ENCOUNTER — Ambulatory Visit (INDEPENDENT_AMBULATORY_CARE_PROVIDER_SITE_OTHER): Payer: Medicare Other | Admitting: Nurse Practitioner

## 2015-02-27 ENCOUNTER — Encounter: Payer: Self-pay | Admitting: Nurse Practitioner

## 2015-02-27 VITALS — BP 132/80 | HR 75 | Temp 97.4°F | Resp 14 | Wt 184.8 lb

## 2015-02-27 DIAGNOSIS — G894 Chronic pain syndrome: Secondary | ICD-10-CM

## 2015-02-27 MED ORDER — GABAPENTIN 300 MG PO CAPS: 300.0000 mg | ORAL_CAPSULE | Freq: Every day | ORAL | Status: DC

## 2015-02-27 NOTE — Progress Notes (Signed)
Pre visit review using our clinic review tool, if applicable. No additional management support is needed unless otherwise documented below in the visit note. 

## 2015-02-27 NOTE — Patient Instructions (Signed)
Try the gabapentin 300 mg 1 capsule at night time before bed.   Caution in taking with klonopin. I would take the Klonopin at least 1 hour different from the gabapentin.   Follow up in 1 month.

## 2015-02-27 NOTE — Progress Notes (Signed)
Subjective:    Patient ID: Cindy Fitzpatrick, female    DOB: [birth year], 79 y.o.   MRN: [id number]  HPI  Cindy Fitzpatrick is a 79 yo female here for a 3 month follow up of all over body pain.  1) Patient is at [place]   Pain- everywhere, went away, but started back a couple days ago   Feet always hurt- stabbing down legs, feet hurt on bottom- plantar facitis, arthritis in both feet, arch collapsed on right, all the time, can't walk anymore  Shooting pain on bilateral legs and cramps, falls often saw neurologists in past and couldn't find anything   Not seen pain management Electrical and stabbing all over body   Left hip is the worst, back hurts from bottom and reports it goes up.   Son helps with medications    Needs physical therapy  Mutliple operations on both knees- total of 8 she reports  Sitting and eating at the table and noticed again          Review of Systems  Constitutional: Negative for fever, chills, diaphoresis and fatigue.  Respiratory: Negative for chest tightness, shortness of breath and wheezing.   Cardiovascular: Negative for chest pain, palpitations and leg swelling.  Gastrointestinal: Negative for nausea, vomiting and diarrhea.  Musculoskeletal: Positive for myalgias, back pain, arthralgias and gait problem. Negative for joint swelling, neck pain and neck stiffness.       Had rolling walker with seat  Skin: Negative for rash.  Neurological: Negative for dizziness, weakness, numbness and headaches.  Psychiatric/Behavioral: The patient is not nervous/anxious.    Past Medical History  Diagnosis Date  . Arthritis   . Depression   . Diverticulitis   . Blood in stool   . Cancer     skin  . Chicken pox     SHINGLES TWICE  . Hay fever   . GERD (gastroesophageal reflux disease)   . Heart disease   . Heart murmur   . Hyperlipidemia   . Colon polyps     History   Social History  . Marital Status: Widowed    Spouse Name: N/A  . Number of Children:  N/A  . Years of Education: N/A   Occupational History  . Not on file.   Social History Main Topics  . Smoking status: Never Smoker   . Smokeless tobacco: Never Used  . Alcohol Use: No  . Drug Use: No  . Sexual Activity: No   Other Topics Concern  . Not on file   Social History Narrative    No past surgical history on file.  No family history on file.  Allergies  Allergen Reactions  . Codeine     Other reaction(s): Headache  . Hydrocodone-Acetaminophen Nausea And Vomiting  . Meperidine     Other reaction(s): Dizziness  . Niacin Er Other (See Comments)    Patient doesn't remember  . Other Nausea Only  . Propoxyphene     Other reaction(s): Dizziness  . Solifenacin Other (See Comments)  . Sulfa Antibiotics Other (See Comments)    Patient doesn' t remember  . Fish-Derived Products Rash    Weakness  . Nabumetone Rash  . Oxycodone-Acetaminophen Rash    Couldn't think or remember anything    Current Outpatient Prescriptions on File Prior to Visit  Medication Sig Dispense Refill  . acetaminophen (TYLENOL) 650 MG CR tablet Take 650 mg by mouth every 8 (eight) hours as needed for pain.    Marland Kitchen  bisacodyl (DULCOLAX) 5 MG EC tablet Take 5 mg by mouth 2 (two) times daily as needed for moderate constipation.    Marland Kitchen buPROPion (WELLBUTRIN SR) 150 MG 12 hr tablet Take 150 mg by mouth daily.    . Calcium Carb-Cholecalciferol 600-800 MG-UNIT TABS Take 1 tablet by mouth 2 (two) times daily.    . Calcium Carbonate-Vitamin D 600-400 MG-UNIT per tablet Take 1 tablet by mouth daily.    . clonazePAM (KLONOPIN) 0.5 MG tablet Take 0.5 mg by mouth 2 (two) times daily as needed for anxiety.    . cloNIDine (CATAPRES) 0.1 MG tablet Take 0.1 mg by mouth 2 (two) times daily.    Marland Kitchen diltiazem (CARDIZEM) 30 MG tablet Take 1 tablet (30 mg total) by mouth 3 (three) times daily. 270 tablet 1  . losartan-hydrochlorothiazide (HYZAAR) 50-12.5 MG per tablet Take 1 tablet by mouth daily.    . Multiple  Vitamins-Minerals (CENTRUM SILVER PO) Take 1 tablet by mouth daily.    . nitroGLYCERIN (NITRODUR - DOSED IN MG/24 HR) 0.4 mg/hr patch Place 0.4 mg onto the skin daily.    . nitroGLYCERIN (NITROSTAT) 0.4 MG SL tablet Place 0.4 mg under the tongue as needed for chest pain.    . Omega-3 Fatty Acids (FISH OIL) 1200 MG CAPS Take 1 capsule by mouth daily.    Marland Kitchen omeprazole (PRILOSEC) 20 MG capsule Take 20 mg by mouth 2 (two) times daily before a meal.    . Ondansetron HCl (ZOFRAN PO) Take 100 mg by mouth daily.    Vladimir Faster Glycol-Propyl Glycol 0.4-0.3 % SOLN Apply 1 drop to eye as needed (As directed).    . polyethylene glycol powder (GLYCOLAX/MIRALAX) powder Take 1 Container by mouth once.     No current facility-administered medications on file prior to visit.       Objective:   Physical Exam  Constitutional: She is oriented to person, place, and time. She appears well-developed and well-nourished. No distress.  BP 132/80 mmHg  Pulse 75  Temp(Src) 97.4 F (36.3 C) (Oral)  Resp 14  Wt 184 lb 12.8 oz (83.825 kg)  SpO2 97%   HENT:  Head: Normocephalic and atraumatic.  Right Ear: External ear normal.  Left Ear: External ear normal.  Cardiovascular: Normal rate, regular rhythm and normal heart sounds.  Exam reveals no gallop and no friction rub.   No murmur heard. Pulmonary/Chest: Effort normal and breath sounds normal. No respiratory distress. She has no wheezes. She has no rales. She exhibits no tenderness.  Musculoskeletal: She exhibits tenderness. She exhibits no edema.  Generalized tenderness- wherever I hold arm/leg ect...  Neurological: She is alert and oriented to person, place, and time. No cranial nerve deficit. She exhibits normal muscle tone. Coordination normal.  Skin: Skin is warm and dry. No rash noted. She is not diaphoretic.  Psychiatric: She has a normal mood and affect. Her behavior is normal. Judgment and thought content normal.      Assessment & Plan:

## 2015-03-08 NOTE — Assessment & Plan Note (Addendum)
[place], placed referral Try Gabapentin 300 mg at night. Told her to take caution with taking Klonopin and gabapentin at night together. FU in 1 month.

## 2015-03-27 ENCOUNTER — Encounter: Payer: Self-pay | Admitting: Nurse Practitioner

## 2015-03-27 ENCOUNTER — Ambulatory Visit (INDEPENDENT_AMBULATORY_CARE_PROVIDER_SITE_OTHER): Payer: Medicare Other | Admitting: Nurse Practitioner

## 2015-03-27 ENCOUNTER — Telehealth: Payer: Self-pay | Admitting: Nurse Practitioner

## 2015-03-27 VITALS — BP 128/68 | HR 83 | Temp 97.4°F | Resp 14 | Ht 62.5 in | Wt 183.4 lb

## 2015-03-27 DIAGNOSIS — G894 Chronic pain syndrome: Secondary | ICD-10-CM | POA: Diagnosis not present

## 2015-03-27 NOTE — Telephone Encounter (Signed)
Pt. left Disability parking Placard to be signed.Left in mailbox. AC

## 2015-03-27 NOTE — Telephone Encounter (Signed)
Noted. Thanks! Given to NP.

## 2015-03-27 NOTE — Progress Notes (Signed)
Pre visit review using our clinic review tool, if applicable. No additional management support is needed unless otherwise documented below in the visit note. 

## 2015-03-27 NOTE — Progress Notes (Signed)
Subjective:    Patient ID: Cindy Fitzpatrick, female    DOB: 05-26-1936, 79 y.o.   MRN: [id number]  HPI  [name] is a 79 yo female here for a follow up from her chronic pain visit 1 month prior.   1) Seen Neurologists in the past, Dr. Mauri Pole for her back, PT in past for strengthening exercises  Neurontin- Falling more often she reports. This has been an issue for her in the past. She states she takes the medication in her kitchen and walks to the bedroom, but falls before she gets there. She is a poor historian and has a complicated history for which she tries to keep records, but reports losing them today.   Spondylolisthesis and Ruptured disc per Dr. Mauri Pole- no other information  Review of Systems  Constitutional: Negative for fever, chills, diaphoresis and fatigue.  Eyes: Negative for visual disturbance.  Respiratory: Negative for chest tightness, shortness of breath and wheezing.   Cardiovascular: Negative for chest pain, palpitations and leg swelling.  Gastrointestinal: Negative for nausea, vomiting and diarrhea.  Musculoskeletal: Positive for myalgias, back pain, arthralgias and gait problem.  Skin: Negative for rash.  Neurological: Negative for dizziness, weakness, numbness and headaches.  Psychiatric/Behavioral: The patient is nervous/anxious.        She is A and O x 3, but has trouble with long term memory   Past Medical History  Diagnosis Date  . Arthritis   . Depression   . Diverticulitis   . Blood in stool   . Cancer     skin  . Chicken pox     SHINGLES TWICE  . Hay fever   . GERD (gastroesophageal reflux disease)   . Heart disease   . Heart murmur   . Hyperlipidemia   . Colon polyps     History   Social History  . Marital Status: Widowed    Spouse Name: N/A  . Number of Children: N/A  . Years of Education: N/A   Occupational History  . Not on file.   Social History Main Topics  . Smoking status: Never Smoker   . Smokeless tobacco: Never Used  .  Alcohol Use: No  . Drug Use: No  . Sexual Activity: No   Other Topics Concern  . Not on file   Social History Narrative    No past surgical history on file.  No family history on file.  Allergies  Allergen Reactions  . Codeine     Other reaction(s): Headache  . Hydrocodone-Acetaminophen Nausea And Vomiting  . Meperidine     Other reaction(s): Dizziness  . Niacin Er Other (See Comments)    Patient doesn't remember  . Other Nausea Only  . Propoxyphene     Other reaction(s): Dizziness  . Solifenacin Other (See Comments)  . Sulfa Antibiotics Other (See Comments)    Patient doesn' t remember  . Fish-Derived Products Rash    Weakness  . Nabumetone Rash  . Oxycodone-Acetaminophen Rash    Couldn't think or remember anything    Current Outpatient Prescriptions on File Prior to Visit  Medication Sig Dispense Refill  . acetaminophen (TYLENOL) 650 MG CR tablet Take 650 mg by mouth every 8 (eight) hours as needed for pain.    . bisacodyl (DULCOLAX) 5 MG EC tablet Take 5 mg by mouth 2 (two) times daily as needed for moderate constipation.    Marland Kitchen buPROPion (WELLBUTRIN SR) 150 MG 12 hr tablet Take 150 mg by mouth daily.    Marland Kitchen  Calcium Carb-Cholecalciferol 600-800 MG-UNIT TABS Take 1 tablet by mouth 2 (two) times daily.    . Calcium Carbonate-Vitamin D 600-400 MG-UNIT per tablet Take 1 tablet by mouth daily.    . clonazePAM (KLONOPIN) 0.5 MG tablet Take 0.5 mg by mouth 2 (two) times daily as needed for anxiety.    . cloNIDine (CATAPRES) 0.1 MG tablet Take 0.1 mg by mouth 2 (two) times daily.    Marland Kitchen diltiazem (CARDIZEM) 30 MG tablet Take 1 tablet (30 mg total) by mouth 3 (three) times daily. 270 tablet 1  . gabapentin (NEURONTIN) 300 MG capsule Take 1 capsule (300 mg total) by mouth at bedtime. 30 capsule 0  . losartan-hydrochlorothiazide (HYZAAR) 50-12.5 MG per tablet Take 1 tablet by mouth daily.    . Multiple Vitamins-Minerals (CENTRUM SILVER PO) Take 1 tablet by mouth daily.    .  nitroGLYCERIN (NITRODUR - DOSED IN MG/24 HR) 0.4 mg/hr patch Place 0.4 mg onto the skin daily.    . nitroGLYCERIN (NITROSTAT) 0.4 MG SL tablet Place 0.4 mg under the tongue as needed for chest pain.    . Omega-3 Fatty Acids (FISH OIL) 1200 MG CAPS Take 1 capsule by mouth daily.    Marland Kitchen omeprazole (PRILOSEC) 20 MG capsule Take 20 mg by mouth 2 (two) times daily before a meal.    . Polyethyl Glycol-Propyl Glycol 0.4-0.3 % SOLN Apply 1 drop to eye as needed (As directed).    . polyethylene glycol powder (GLYCOLAX/MIRALAX) powder Take 1 Container by mouth once.    . Ondansetron HCl (ZOFRAN PO) Take 100 mg by mouth daily.     No current facility-administered medications on file prior to visit.      Objective:   Physical Exam  Constitutional: She is oriented to person, place, and time. She appears well-developed and well-nourished. No distress.  BP 128/68 mmHg  Pulse 83  Temp(Src) 97.4 F (36.3 C) (Oral)  Resp 14  Ht 5' 2.5" (1.588 m)  Wt 183 lb 6.4 oz (83.19 kg)  BMI 32.99 kg/m2  SpO2 98%   HENT:  Head: Normocephalic and atraumatic.  Right Ear: External ear normal.  Left Ear: External ear normal.  Cardiovascular: Normal rate, regular rhythm and normal heart sounds.   Pulmonary/Chest: Effort normal and breath sounds normal. No respiratory distress. She has no wheezes. She has no rales. She exhibits no tenderness.  Neurological: She is alert and oriented to person, place, and time. No cranial nerve deficit. She exhibits normal muscle tone. Coordination abnormal.  Has a walker (rolling with pouches for items)  Skin: Skin is warm and dry. No rash noted. She is not diaphoretic.  Psychiatric: Thought content normal. Her mood appears anxious. Her affect is not angry, not blunt, not labile and not inappropriate. Her speech is tangential. Her speech is not rapid and/or pressured, not delayed and not slurred. She is not agitated, not aggressive, not hyperactive, not slowed, not withdrawn, not actively  hallucinating and not combative. She does not express impulsivity or inappropriate judgment. She does not exhibit a depressed mood. She is communicative.  Patient has a nervous laugh, and makes occasional eye contact.  She is attentive.      Assessment & Plan:  I have personally spent 25 minutes with this patient face to face with greater than 50% spent on discussing treatment plans, medications, fall risk, referrals, and her medical history.

## 2015-03-27 NOTE — Patient Instructions (Addendum)
See you in 3 months. Please use your walker. No more Gabapentin

## 2015-04-07 ENCOUNTER — Telehealth: Payer: Self-pay

## 2015-04-07 NOTE — Telephone Encounter (Signed)
Called patient and left message that the handicap paperwork was ready to be picked up or mailed. Holding in the office file until we here from her. Card does not expire until February.

## 2015-04-12 NOTE — Assessment & Plan Note (Signed)
Will no longer prescribe medications for her regarding pain due to fall risk. Asked patient whether she wants to have a referral again for PT (declined the first time), pain management, or neurology with movement disorders. She declines all. I discussed how there is not much I can do for her at this time and that I do not want to harm her with trying different medications. I asked her to return when she knows what she wants going forward.

## 2015-04-14 ENCOUNTER — Other Ambulatory Visit: Payer: Self-pay | Admitting: *Deleted

## 2015-04-14 NOTE — Telephone Encounter (Signed)
Ok to refill, historical meds?

## 2015-04-15 ENCOUNTER — Other Ambulatory Visit: Payer: Self-pay | Admitting: Nurse Practitioner

## 2015-04-18 MED ORDER — NITROGLYCERIN 0.4 MG/HR TD PT24: MEDICATED_PATCH | TRANSDERMAL | Status: DC

## 2015-04-18 MED ORDER — NITROGLYCERIN 0.4 MG SL SUBL: 0.4000 mg | SUBLINGUAL_TABLET | SUBLINGUAL | Status: DC | PRN

## 2015-04-24 ENCOUNTER — Other Ambulatory Visit: Payer: Self-pay | Admitting: *Deleted

## 2015-04-24 MED ORDER — LOSARTAN POTASSIUM-HCTZ 50-12.5 MG PO TABS: 1.0000 | ORAL_TABLET | Freq: Every day | ORAL | Status: DC

## 2015-05-01 ENCOUNTER — Other Ambulatory Visit: Payer: Self-pay | Admitting: *Deleted

## 2015-05-01 MED ORDER — BUPROPION HCL ER (SR) 150 MG PO TB12: 150.0000 mg | ORAL_TABLET | Freq: Every day | ORAL | Status: DC

## 2015-05-01 MED ORDER — CLONIDINE HCL 0.1 MG PO TABS: 0.1000 mg | ORAL_TABLET | Freq: Two times a day (BID) | ORAL | Status: DC

## 2015-06-11 ENCOUNTER — Emergency Department
Admission: EM | Admit: 2015-06-11 | Discharge: 2015-06-11 | Disposition: A | Discharge status: Home / Self Care | Payer: Medicare Other | Attending: Emergency Medicine | Admitting: Emergency Medicine

## 2015-06-11 ENCOUNTER — Emergency Department: Payer: Medicare Other

## 2015-06-11 ENCOUNTER — Encounter: Payer: Self-pay | Admitting: Emergency Medicine

## 2015-06-11 ENCOUNTER — Other Ambulatory Visit: Payer: Self-pay

## 2015-06-11 DIAGNOSIS — I10 Essential (primary) hypertension: Secondary | ICD-10-CM | POA: Insufficient documentation

## 2015-06-11 DIAGNOSIS — S0990XA Unspecified injury of head, initial encounter: Secondary | ICD-10-CM

## 2015-06-11 DIAGNOSIS — Y9289 Other specified places as the place of occurrence of the external cause: Secondary | ICD-10-CM | POA: Insufficient documentation

## 2015-06-11 DIAGNOSIS — F419 Anxiety disorder, unspecified: Secondary | ICD-10-CM | POA: Diagnosis not present

## 2015-06-11 DIAGNOSIS — Y998 Other external cause status: Secondary | ICD-10-CM | POA: Diagnosis not present

## 2015-06-11 DIAGNOSIS — Y9389 Activity, other specified: Secondary | ICD-10-CM | POA: Diagnosis not present

## 2015-06-11 DIAGNOSIS — W19XXXA Unspecified fall, initial encounter: Secondary | ICD-10-CM

## 2015-06-11 DIAGNOSIS — S0101XA Laceration without foreign body of scalp, initial encounter: Secondary | ICD-10-CM | POA: Diagnosis not present

## 2015-06-11 DIAGNOSIS — S20219A Contusion of unspecified front wall of thorax, initial encounter: Secondary | ICD-10-CM | POA: Diagnosis not present

## 2015-06-11 DIAGNOSIS — W010XXA Fall on same level from slipping, tripping and stumbling without subsequent striking against object, initial encounter: Secondary | ICD-10-CM | POA: Insufficient documentation

## 2015-06-11 LAB — URINALYSIS COMPLETE WITH MICROSCOPIC (ARMC ONLY)
Bilirubin Urine: NEGATIVE
Glucose, UA: NEGATIVE mg/dL
HGB URINE DIPSTICK: NEGATIVE
Ketones, ur: NEGATIVE mg/dL
NITRITE: NEGATIVE
PH: 7 (ref 5.0–8.0)
PROTEIN: NEGATIVE mg/dL
SPECIFIC GRAVITY, URINE: 1.009 (ref 1.005–1.030)

## 2015-06-11 LAB — CBC WITH DIFFERENTIAL/PLATELET
BASOS PCT: 1 %
Basophils Absolute: 0 10*3/uL (ref 0–0.1)
EOS ABS: 0.4 10*3/uL (ref 0–0.7)
EOS PCT: 5 %
HCT: 37.7 % (ref 35.0–47.0)
Hemoglobin: 11.9 g/dL — ABNORMAL LOW (ref 12.0–16.0)
LYMPHS ABS: 1.3 10*3/uL (ref 1.0–3.6)
Lymphocytes Relative: 19 %
MCH: 24 pg — AB (ref 26.0–34.0)
MCHC: 31.6 g/dL — AB (ref 32.0–36.0)
MCV: 76.1 fL — ABNORMAL LOW (ref 80.0–100.0)
Monocytes Absolute: 0.7 10*3/uL (ref 0.2–0.9)
Monocytes Relative: 9 %
NEUTROS PCT: 66 %
Neutro Abs: 4.7 10*3/uL (ref 1.4–6.5)
PLATELETS: 212 10*3/uL (ref 150–440)
RBC: 4.95 MIL/uL (ref 3.80–5.20)
RDW: 17.3 % — ABNORMAL HIGH (ref 11.5–14.5)
WBC: 7.1 10*3/uL (ref 3.6–11.0)

## 2015-06-11 LAB — COMPREHENSIVE METABOLIC PANEL
ALBUMIN: 4.5 g/dL (ref 3.5–5.0)
ALT: 19 U/L (ref 14–54)
ANION GAP: 9 (ref 5–15)
AST: 30 U/L (ref 15–41)
Alkaline Phosphatase: 63 U/L (ref 38–126)
BUN: 17 mg/dL (ref 6–20)
CHLORIDE: 100 mmol/L — AB (ref 101–111)
CO2: 27 mmol/L (ref 22–32)
Calcium: 9.6 mg/dL (ref 8.9–10.3)
Creatinine, Ser: 0.9 mg/dL (ref 0.44–1.00)
GFR calc non Af Amer: >60 mL/min (ref >60)
GLUCOSE: 116 mg/dL — AB (ref 65–99)
Potassium: 3.9 mmol/L (ref 3.5–5.1)
SODIUM: 136 mmol/L (ref 135–145)
Total Bilirubin: 0.5 mg/dL (ref 0.3–1.2)
Total Protein: 7.3 g/dL (ref 6.5–8.1)

## 2015-06-11 LAB — TROPONIN I

## 2015-06-11 MED ORDER — LORAZEPAM 2 MG/ML IJ SOLN
1.0000 mg | Freq: Once | INTRAMUSCULAR | Status: AC
  Administered 2015-06-11: 1 mg via INTRAVENOUS
  Filled 2015-06-11: qty 1

## 2015-06-11 NOTE — ED Provider Notes (Signed)
[facility] Emergency Department Provider Note     Time seen: ----------------------------------------- 10:57 AM on 06/11/2015 -----------------------------------------    I have reviewed the triage vital signs and the nursing notes.   HISTORY  Chief Complaint No chief complaint on file.    HPI Cindy Fitzpatrick is a 79 y.o. female who presents ER being brought from independent living after she tripped and fell. She was noted to have a laceration to her posterior scalp. She was complaining of chest wall pain on both sides. Patient was anxious, states she received a bad phone call this morning. She is unclear the reason that she fell. Does complain of moderate to severe chest wall and rib pain, worse on the left.   Past Medical History  Diagnosis Date  . Arthritis   . Depression   . Diverticulitis   . Blood in stool   . Cancer     skin  . Chicken pox     SHINGLES TWICE  . Hay fever   . GERD (gastroesophageal reflux disease)   . Heart disease   . Heart murmur   . Hyperlipidemia   . Colon polyps     Patient Active Problem List   Diagnosis Date Noted  . Chronic pain syndrome 02/13/2015  . Fatigue 02/13/2015  . HTN (hypertension) 02/13/2015  . Obesity (BMI 30-39.9) 12/01/2014  . Routine general medical examination at a health care facility 12/01/2014    No past surgical history on file.  Allergies Codeine; Hydrocodone-acetaminophen; Meperidine; Niacin er; Other; Propoxyphene; Solifenacin; Sulfa antibiotics; Fish-derived products; Nabumetone; and Oxycodone-acetaminophen  Social History Social History  Substance Use Topics  . Smoking status: Never Smoker   . Smokeless tobacco: Never Used  . Alcohol Use: No    Review of Systems Constitutional: Negative for fever. Positive for scalp laceration Eyes: Negative for visual changes. ENT: Negative for sore throat. Cardiovascular: Positive for chest pain Respiratory: Negative for shortness of  breath. Gastrointestinal: Negative for abdominal pain, vomiting and diarrhea. Genitourinary: Negative for dysuria. Musculoskeletal: Positive for bilateral rib pain Skin: Negative for rash. Positive for scalp laceration Neurological: Negative for headaches, focal weakness or numbness.  10-point ROS otherwise negative.  ____________________________________________   PHYSICAL EXAM:  VITAL SIGNS: ED Triage Vitals  Enc Vitals Group     BP --      Pulse --      Resp --      Temp --      Temp src --      SpO2 --      Weight --      Height --      Head Cir --      Peak Flow --      Pain Score --      Pain Loc --      Pain Edu? --      Excl. in [place]? --     Constitutional: Alert and oriented. Mildly distressed with anxiety Eyes: Conjunctivae are normal. PERRL. Normal extraocular movements. ENT   Head: 3 cm posterior scalp laceration   Nose: No congestion/rhinnorhea.   Mouth/Throat: Mucous membranes are moist.   Neck: No stridor. Cardiovascular: Normal rate, regular rhythm. Normal and symmetric distal pulses are present in all extremities. No murmurs, rubs, or gallops. Respiratory: Normal respiratory effort without tachypnea nor retractions. Breath sounds are clear and equal bilaterally. No wheezes/rales/rhonchi. Gastrointestinal: Soft and nontender. No distention. No abdominal bruits.  Musculoskeletal: Nontender with normal range of motion in all extremities. No joint  effusions.  No lower extremity tenderness nor edema. Neurologic:  Normal speech and language. No gross focal neurologic deficits are appreciated. Speech is normal. No gait instability. Skin:  Skin is warm, dry and intact. No rash noted. Psychiatric: Mood and affect are normal, however patient appears very anxious at this time ____________________________________________  EKG: Interpreted by me. Normal sinus rhythm with right bundle branch block, rate is 86 bpm, normal QT interval. Normal  axis.  ____________________________________________  ED COURSE:  Pertinent labs & imaging results that were available during my care of the patient were reviewed by me and considered in my medical decision making (see chart for details). Patient name basic labs and imaging. Also laceration repair LACERATION REPAIR Performed by: Earleen Newport Authorized by: Lenise Arena E Consent: Verbal consent obtained. Risks and benefits: risks, benefits and alternatives were discussed Consent given by: patient Patient identity confirmed: provided demographic data Prepped and Draped in normal sterile fashion Wound explored  Laceration Location: Posterior scalp  Laceration Length: 3 cm  No Foreign Bodies seen or palpated  Irrigation method: syringe Amount of cleaning: standard  Skin closure: Staples   Number of staples: 3  Technique: Standard interrupted   Patient tolerance: Patient tolerated the procedure well with no immediate complications. ____________________________________________    LABS (pertinent positives/negatives)  Labs Reviewed  CBC WITH DIFFERENTIAL/PLATELET - Abnormal; Notable for the following:    Hemoglobin 11.9 (*)    MCV 76.1 (*)    MCH 24.0 (*)    MCHC 31.6 (*)    RDW 17.3 (*)    All other components within normal limits  COMPREHENSIVE METABOLIC PANEL - Abnormal; Notable for the following:    Chloride 100 (*)    Glucose, Bld 116 (*)    All other components within normal limits  URINALYSIS COMPLETEWITH MICROSCOPIC ([facility] ONLY) - Abnormal; Notable for the following:    Color, Urine YELLOW (*)    APPearance HAZY (*)    Leukocytes, UA TRACE (*)    Bacteria, UA RARE (*)    Squamous Epithelial / LPF 0-5 (*)    All other components within normal limits  TROPONIN I    RADIOLOGY Images were viewed by me  CT head, chest x-ray  IMPRESSION: 1. Stable atrophy and white matter disease. 2. No acute intracranial abnormality. 3. Right parietal and  occipital laceration and scalp hematoma without an underlying fracture.   IMPRESSION: 1. Mild cardiomegaly exaggerated by low lung volumes. 2. Atherosclerosis. 3. Chronic elevation of the right hemidiaphragm. 4. No acute cardiopulmonary disease. ____________________________________________  FINAL ASSESSMENT AND PLAN  Fall, rib contusion, minor head injury, scalp laceration  Plan: Patient with labs and imaging as dictated above. Patient with negative imaging here, workup to this point has been unremarkable. She is stable for outpatient follow-up with her doctor. Return in 10 days for staple removal   Earleen Newport, MD   Earleen Newport, MD 06/11/15 [phone]

## 2015-06-11 NOTE — Discharge Instructions (Signed)
Head Injury You have received a head injury. It does not appear serious at this time. Headaches and vomiting are common following head injury. It should be easy to awaken from sleeping. Sometimes it is necessary for you to stay in the emergency department for a while for observation. Sometimes admission to the hospital may be needed. After injuries such as yours, most problems occur within the first 24 hours, but side effects may occur up to 7-10 days after the injury. It is important for you to carefully monitor your condition and contact your health care provider or seek immediate medical care if there is a change in your condition. WHAT ARE THE TYPES OF HEAD INJURIES? Head injuries can be as minor as a bump. Some head injuries can be more severe. More severe head injuries include:  A jarring injury to the brain (concussion).  A bruise of the brain (contusion). This mean there is bleeding in the brain that can cause swelling.  A cracked skull (skull fracture).  Bleeding in the brain that collects, clots, and forms a bump (hematoma). WHAT CAUSES A HEAD INJURY? A serious head injury is most likely to happen to someone who is in a car wreck and is not wearing a seat belt. Other causes of major head injuries include bicycle or motorcycle accidents, sports injuries, and falls. HOW ARE HEAD INJURIES DIAGNOSED? A complete history of the event leading to the injury and your current symptoms will be helpful in diagnosing head injuries. Many times, pictures of the brain, such as CT or MRI are needed to see the extent of the injury. Often, an overnight hospital stay is necessary for observation.  WHEN SHOULD I SEEK IMMEDIATE MEDICAL CARE?  You should get help right away if:  You have confusion or drowsiness.  You feel sick to your stomach (nauseous) or have continued, forceful vomiting.  You have dizziness or unsteadiness that is getting worse.  You have severe, continued headaches not relieved by  medicine. Only take over-the-counter or prescription medicines for pain, fever, or discomfort as directed by your health care provider.  You do not have normal function of the arms or legs or are unable to walk.  You notice changes in the black spots in the center of the colored part of your eye (pupil).  You have a clear or bloody fluid coming from your nose or ears.  You have a loss of vision. During the next 24 hours after the injury, you must stay with someone who can watch you for the warning signs. This person should contact local emergency services (911 in the [place] if you have seizures, you become unconscious, or you are unable to wake up. HOW CAN I PREVENT A HEAD INJURY IN THE FUTURE? The most important factor for preventing major head injuries is avoiding motor vehicle accidents. To minimize the potential for damage to your head, it is crucial to wear seat belts while riding in motor vehicles. Wearing helmets while bike riding and playing collision sports (like football) is also helpful. Also, avoiding dangerous activities around the house will further help reduce your risk of head injury.  WHEN CAN I RETURN TO NORMAL ACTIVITIES AND ATHLETICS? You should be reevaluated by your health care provider before returning to these activities. If you have any of the following symptoms, you should not return to activities or contact sports until 1 week after the symptoms have stopped:  Persistent headache.  Dizziness or vertigo.  Poor attention and concentration.  Confusion.  Memory problems.  Nausea or vomiting.  Fatigue or tire easily.  Irritability.  Intolerant of bright lights or loud noises.  Anxiety or depression.  Disturbed sleep. MAKE SURE YOU:   Understand these instructions.  Will watch your condition.  Will get help right away if you are not doing well or get worse. Document Released: 10/11/2005 Document Revised: 10/16/2013 Document Reviewed:  06/18/2013 [facility] Patient Information 2015 [place], [place]. This information is not intended to replace advice given to you by your health care provider. Make sure you discuss any questions you have with your health care provider. Staple Care and Removal Your caregiver has used staples today to repair your wound. Staples are used to help a wound heal faster by holding the edges of the wound together. The staples can be removed when the wound has healed well enough to stay together after the staples are removed. A dressing (wound covering), depending on the location of the wound, may have been applied. This may be changed once per day or as instructed. If the dressing sticks, it may be soaked off with soapy water or hydrogen peroxide. Only take over-the-counter or prescription medicines for pain, discomfort, or fever as directed by your caregiver.  If you did not receive a tetanus shot today because you did not recall when your last one was given, check with your caregiver when you have your staples removed to determine if one is needed. Return to your caregiver's office in 1 week or as suggested to have your staples removed. SEEK IMMEDIATE MEDICAL CARE IF:   You have redness, swelling, or increasing pain in the wound.  You have pus coming from the wound.  You have a fever.  You notice a bad smell coming from the wound or dressing.  Your wound edges break open after staples have been removed. Document Released: 07/06/2001 Document Revised: 01/03/2012 Document Reviewed: 07/21/2005 [facility] Patient Information 2015 [place], [place]. This information is not intended to replace advice given to you by your health care provider. Make sure you discuss any questions you have with your health care provider.

## 2015-06-11 NOTE — ED Notes (Signed)
MD at bedside., DR. Williams to eval , 3 staples to lac

## 2015-06-11 NOTE — ED Notes (Signed)
Pt from independent living at [facility], tripped and , lac noted to posterior scalp, bleeding controled on arrival , pt complaining left chest wall pain. Pt with a nervious appearance

## 2015-06-13 ENCOUNTER — Ambulatory Visit (INDEPENDENT_AMBULATORY_CARE_PROVIDER_SITE_OTHER): Payer: Medicare Other | Admitting: Nurse Practitioner

## 2015-06-13 VITALS — BP 118/60 | HR 86 | Temp 98.3°F | Resp 14 | Ht 62.5 in | Wt 179.2 lb

## 2015-06-13 DIAGNOSIS — G894 Chronic pain syndrome: Secondary | ICD-10-CM | POA: Diagnosis not present

## 2015-06-13 DIAGNOSIS — E669 Obesity, unspecified: Secondary | ICD-10-CM

## 2015-06-13 NOTE — Progress Notes (Signed)
Patient ID: Cindy Fitzpatrick, female    DOB: 30-May-1936  Age: 79 y.o. MRN: [id number]  CC: Follow-up   HPI Cindy Fitzpatrick presents for ER follow up after a fall.   1) 3 staples in back of head after falling, Answered the following questions correctly: Season- summer Year- 2016  Where are you- [place]  Slight frontal headache, but no other complaints. We discussed her discharge paperwork.   CT of head- stable atrophy, superficial laceration with hematoma without fracture.   Chest x-ray- Mild known cardiomegaly, atherosclerosis, chronic elevation of right hemidiaphragm, no other significant findings  Labs- UA, CBC w/ diff, CMET, and troponin without significant findings   History Cindy Fitzpatrick has a past medical history of Arthritis; Depression; Diverticulitis; Blood in stool; Cancer; Chicken pox; Hay fever; GERD (gastroesophageal reflux disease); Heart disease; Heart murmur; Hyperlipidemia; and Colon polyps.   She has past surgical history that includes Knee surgery and Hernia repair.   Her family history is not on file.She reports that she has never smoked. She has never used smokeless tobacco. She reports that she does not drink alcohol or use illicit drugs.  Outpatient Prescriptions Prior to Visit  Medication Sig Dispense Refill  . acetaminophen (TYLENOL) 650 MG CR tablet Take 650 mg by mouth every 8 (eight) hours as needed for pain.    Marland Kitchen aspirin EC 81 MG tablet Take 81 mg by mouth daily.    . bisacodyl (DULCOLAX) 5 MG EC tablet Take 5 mg by mouth 2 (two) times daily as needed for moderate constipation.    Marland Kitchen buPROPion (WELLBUTRIN SR) 150 MG 12 hr tablet Take 1 tablet (150 mg total) by mouth daily. 30 tablet 2  . Calcium Carb-Cholecalciferol 600-800 MG-UNIT TABS Take 1 tablet by mouth 2 (two) times daily.    . clonazePAM (KLONOPIN) 0.5 MG tablet Take 0.5-1 mg by mouth See admin instructions. Take 0.5mg  in the morning and at noon. Take 1mg  at bedtime    . cloNIDine (CATAPRES) 0.1 MG tablet  Take 1 tablet (0.1 mg total) by mouth 2 (two) times daily. 60 tablet 5  . diltiazem (CARDIZEM) 30 MG tablet Take 1 tablet (30 mg total) by mouth 3 (three) times daily. 270 tablet 1  . docusate sodium (COLACE) 100 MG capsule Take 100 mg by mouth 2 (two) times daily.    Marland Kitchen losartan-hydrochlorothiazide (HYZAAR) 50-12.5 MG per tablet Take 1 tablet by mouth daily. 90 tablet 1  . Multiple Vitamins-Minerals (CENTRUM SILVER PO) Take 1 tablet by mouth daily.    . nitroGLYCERIN (NITRODUR - DOSED IN MG/24 HR) 0.4 mg/hr patch Apply one patch to skin once daily. Leave on 12-14 hours the remove for 10-12 hours before applying the next patch. 30 patch 5  . nitroGLYCERIN (NITROSTAT) 0.4 MG SL tablet Place 1 tablet (0.4 mg total) under the tongue as needed for chest pain. 25 tablet 0  . Omega-3 Fatty Acids (FISH OIL) 1200 MG CAPS Take 1 capsule by mouth 2 (two) times daily.     Marland Kitchen omeprazole (PRILOSEC) 20 MG capsule Take 20 mg by mouth 2 (two) times daily before a meal.    . Polyethyl Glycol-Propyl Glycol 0.4-0.3 % SOLN Apply 1 drop to eye as needed (dry eyes).     . sertraline (ZOLOFT) 100 MG tablet Take 2 tablets by mouth at bedtime.     No facility-administered medications prior to visit.    ROS Review of Systems  Constitutional: Negative for fever, chills, diaphoresis and fatigue.  Eyes: Negative for  visual disturbance.  Respiratory: Negative for chest tightness, shortness of breath and wheezing.   Cardiovascular: Negative for chest pain, palpitations and leg swelling.  Gastrointestinal: Negative for nausea, vomiting and diarrhea.  Neurological: Positive for headaches. Negative for dizziness, tremors, seizures, syncope, facial asymmetry, speech difficulty, weakness, light-headedness and numbness.  Psychiatric/Behavioral: The patient is not nervous/anxious.     Objective:  BP 118/60 mmHg  Pulse 86  Temp(Src) 98.3 F (36.8 C)  Resp 14  Ht 5' 2.5" (1.588 m)  Wt 179 lb 3.2 oz (81.285 kg)  BMI 32.23  kg/m2  SpO2 97%  Physical Exam  Constitutional: She is oriented to person, place, and time. She appears well-developed and well-nourished. No distress.  HENT:  Head: Normocephalic and atraumatic.  Right Ear: External ear normal.  Left Ear: External ear normal.  Neurological: She is alert and oriented to person, place, and time. No cranial nerve deficit. She exhibits normal muscle tone. Coordination abnormal.  Pt using rolling walker as usual, answered questions correctly  Skin: Skin is warm and dry. No rash noted. She is not diaphoretic.  Psychiatric: She has a normal mood and affect. Her behavior is normal. Judgment and thought content normal.   Assessment & Plan:   Cindy Fitzpatrick was seen today for follow-up.  Diagnoses and all orders for this visit:  Obesity (BMI 30-39.9)  Chronic pain syndrome  I am having Cindy Fitzpatrick maintain her diltiazem, clonazePAM, omeprazole, Polyethyl Glycol-Propyl Glycol, acetaminophen, Fish Oil, bisacodyl, Multiple Vitamins-Minerals (CENTRUM SILVER PO), Calcium Carb-Cholecalciferol, nitroGLYCERIN, nitroGLYCERIN, losartan-hydrochlorothiazide, buPROPion, cloNIDine, sertraline, docusate sodium, and aspirin EC.  No orders of the defined types were placed in this encounter.     Follow-up: Return in about 1 week (around 06/20/2015) for Staple removal and 3 month follow up.

## 2015-06-13 NOTE — Progress Notes (Signed)
Pre visit review using our clinic review tool, if applicable. No additional management support is needed unless otherwise documented below in the visit note. 

## 2015-06-16 ENCOUNTER — Ambulatory Visit: Payer: Medicare Other | Admitting: Nurse Practitioner

## 2015-06-20 ENCOUNTER — Ambulatory Visit (INDEPENDENT_AMBULATORY_CARE_PROVIDER_SITE_OTHER): Payer: Medicare Other | Admitting: Nurse Practitioner

## 2015-06-20 VITALS — BP 122/80 | HR 76 | Temp 98.4°F | Resp 14 | Ht 63.0 in | Wt 181.0 lb

## 2015-06-20 DIAGNOSIS — G894 Chronic pain syndrome: Secondary | ICD-10-CM | POA: Diagnosis not present

## 2015-06-20 DIAGNOSIS — Z418 Encounter for other procedures for purposes other than remedying health state: Secondary | ICD-10-CM | POA: Diagnosis not present

## 2015-06-20 DIAGNOSIS — S0101XS Laceration without foreign body of scalp, sequela: Secondary | ICD-10-CM

## 2015-06-20 DIAGNOSIS — Z299 Encounter for prophylactic measures, unspecified: Secondary | ICD-10-CM

## 2015-06-20 MED ORDER — TETANUS-DIPHTHERIA TOXOIDS TD 5-2 LFU IM INJ
0.5000 mL | INJECTION | Freq: Once | INTRAMUSCULAR | Status: AC
  Administered 2015-06-20: 0.5 mL via INTRAMUSCULAR

## 2015-06-20 NOTE — Progress Notes (Signed)
Pre visit review using our clinic review tool, if applicable. No additional management support is needed unless otherwise documented below in the visit note. 

## 2015-06-20 NOTE — Progress Notes (Signed)
Patient ID: Cindy Fitzpatrick, female    DOB: 07/28/1936  Age: 79 y.o. MRN: [id number]  CC: Follow-up   HPI Cindy Fitzpatrick presents for follow up and staple removal.   1) 3 staples posterior right side of head removed, scalp cleaned, and triple abx ointment applied.   2) Pt pointed out hernia again on RLQ and she adamantly refuses to have anything done at this time.   History Cindy Fitzpatrick has a past medical history of Arthritis; Depression; Diverticulitis; Blood in stool; Cancer; Chicken pox; Hay fever; GERD (gastroesophageal reflux disease); Heart disease; Heart murmur; Hyperlipidemia; and Colon polyps.   She has past surgical history that includes Knee surgery and Hernia repair.   Her family history is not on file.She reports that she has never smoked. She has never used smokeless tobacco. She reports that she does not drink alcohol or use illicit drugs.  Outpatient Prescriptions Prior to Visit  Medication Sig Dispense Refill  . acetaminophen (TYLENOL) 650 MG CR tablet Take 650 mg by mouth every 8 (eight) hours as needed for pain.    Marland Kitchen aspirin EC 81 MG tablet Take 81 mg by mouth daily.    . bisacodyl (DULCOLAX) 5 MG EC tablet Take 5 mg by mouth 2 (two) times daily as needed for moderate constipation.    Marland Kitchen buPROPion (WELLBUTRIN SR) 150 MG 12 hr tablet Take 1 tablet (150 mg total) by mouth daily. 30 tablet 2  . Calcium Carb-Cholecalciferol 600-800 MG-UNIT TABS Take 1 tablet by mouth 2 (two) times daily.    . clonazePAM (KLONOPIN) 0.5 MG tablet Take 0.5-1 mg by mouth See admin instructions. Take 0.5mg  in the morning and at noon. Take 1mg  at bedtime    . cloNIDine (CATAPRES) 0.1 MG tablet Take 1 tablet (0.1 mg total) by mouth 2 (two) times daily. 60 tablet 5  . diltiazem (CARDIZEM) 30 MG tablet Take 1 tablet (30 mg total) by mouth 3 (three) times daily. 270 tablet 1  . docusate sodium (COLACE) 100 MG capsule Take 100 mg by mouth 2 (two) times daily.    Marland Kitchen losartan-hydrochlorothiazide (HYZAAR) 50-12.5  MG per tablet Take 1 tablet by mouth daily. 90 tablet 1  . Multiple Vitamins-Minerals (CENTRUM SILVER PO) Take 1 tablet by mouth daily.    . nitroGLYCERIN (NITRODUR - DOSED IN MG/24 HR) 0.4 mg/hr patch Apply one patch to skin once daily. Leave on 12-14 hours the remove for 10-12 hours before applying the next patch. 30 patch 5  . nitroGLYCERIN (NITROSTAT) 0.4 MG SL tablet Place 1 tablet (0.4 mg total) under the tongue as needed for chest pain. 25 tablet 0  . Omega-3 Fatty Acids (FISH OIL) 1200 MG CAPS Take 1 capsule by mouth 2 (two) times daily.     Marland Kitchen omeprazole (PRILOSEC) 20 MG capsule Take 20 mg by mouth 2 (two) times daily before a meal.    . Polyethyl Glycol-Propyl Glycol 0.4-0.3 % SOLN Apply 1 drop to eye as needed (dry eyes).     . sertraline (ZOLOFT) 100 MG tablet Take 2 tablets by mouth at bedtime.     No facility-administered medications prior to visit.    ROS Review of Systems  Constitutional: Negative for fever, chills, diaphoresis and fatigue.  Gastrointestinal: Positive for abdominal distention.  Neurological: Negative for dizziness, seizures, syncope, speech difficulty and headaches.    Objective:  BP 122/80 mmHg  Pulse 76  Temp(Src) 98.4 F (36.9 C)  Resp 14  Ht 5\' 3"  (1.6 m)  Wt 181  lb (82.101 kg)  BMI 32.07 kg/m2  SpO2 95%  Physical Exam  Constitutional: She is oriented to person, place, and time. She appears well-developed and well-nourished. No distress.  HENT:  Head: Normocephalic and atraumatic.  Right Ear: External ear normal.  Left Ear: External ear normal.  Abdominal:  Large ventral hernia on the right side of abdomen  Neurological: She is alert and oriented to person, place, and time.  Skin: Skin is warm and dry. She is not diaphoretic.  Scalp laceration is well-healing. Dried blood  Psychiatric: She has a normal mood and affect. Her behavior is normal. Judgment and thought content normal.   Assessment & Plan:   Cindy Fitzpatrick was seen today for  follow-up.  Diagnoses and all orders for this visit:  Need for prophylactic measure -     tetanus & diphtheria toxoids (adult) (TENIVAC) injection 0.5 mL; Inject 0.5 mLs into the muscle once.  Chronic pain syndrome  Scalp laceration, sequela   I am having Cindy Fitzpatrick maintain her diltiazem, clonazePAM, omeprazole, Polyethyl Glycol-Propyl Glycol, acetaminophen, Fish Oil, bisacodyl, Multiple Vitamins-Minerals (CENTRUM SILVER PO), Calcium Carb-Cholecalciferol, nitroGLYCERIN, nitroGLYCERIN, losartan-hydrochlorothiazide, buPROPion, cloNIDine, sertraline, docusate sodium, and aspirin EC. We administered tetanus & diphtheria toxoids (adult).  Meds ordered this encounter  Medications  . tetanus & diphtheria toxoids (adult) (TENIVAC) injection 0.5 mL    Sig:      Follow-up: Return in about 3 months (around 09/20/2015).

## 2015-06-20 NOTE — Patient Instructions (Addendum)
Follow up in 3 months.   Call me if your head feels worse or anything changes.

## 2015-06-22 ENCOUNTER — Encounter: Payer: Self-pay | Admitting: Nurse Practitioner

## 2015-06-22 NOTE — Assessment & Plan Note (Signed)
Pt is still limited and has poor coordination. She does continue to watch her diet, but reports there is a lot of fried food that is salty provided for the main meal.

## 2015-06-22 NOTE — Assessment & Plan Note (Signed)
Still having chronic pain, but refuses any further treatment or work up. She has a hernia that has had multiple surgeries and she refuses to let anyone else touch it or see her for this. She understands the risks.

## 2015-06-27 ENCOUNTER — Ambulatory Visit: Payer: Medicare Other | Admitting: Nurse Practitioner

## 2015-07-03 ENCOUNTER — Encounter: Payer: Self-pay | Admitting: Nurse Practitioner

## 2015-07-03 DIAGNOSIS — S0101XA Laceration without foreign body of scalp, initial encounter: Secondary | ICD-10-CM | POA: Insufficient documentation

## 2015-07-03 DIAGNOSIS — Z299 Encounter for prophylactic measures, unspecified: Secondary | ICD-10-CM | POA: Insufficient documentation

## 2015-07-03 NOTE — Assessment & Plan Note (Addendum)
Scalp laceration from fall. Patient was seen in the ED on 06/11/2015. Patient had 3 staples placed in the posterior part of the head. Patient had hematoma which is resolving. Patient had 3 staples removed today and verified by CMA Cheyenne Adas. Laceration with dried blood was cleaned with little bit of hydrogen peroxide and triple antibiotic ointment was placed over site. Patient denies headache and is alert and oriented 3.  Head CT at time of ER visit was negative for any hematoma or fracture to skull.

## 2015-07-03 NOTE — Assessment & Plan Note (Signed)
Td was updated today.

## 2015-07-03 NOTE — Assessment & Plan Note (Signed)
Chronic pain is stemming from multiple areas including back and from large ventral hernia. Patient adamantly refuses to see pain management or surgeon regarding abdomen. Patient reports multiple surgeries and she feels that nothing else will help her at this point she will deal with the pain.

## 2015-09-15 ENCOUNTER — Encounter: Payer: Self-pay | Admitting: Nurse Practitioner

## 2015-09-15 ENCOUNTER — Ambulatory Visit (INDEPENDENT_AMBULATORY_CARE_PROVIDER_SITE_OTHER): Payer: Medicare Other | Admitting: Nurse Practitioner

## 2015-09-15 VITALS — BP 112/70 | HR 98 | Temp 98.0°F | Wt 177.0 lb

## 2015-09-15 DIAGNOSIS — E669 Obesity, unspecified: Secondary | ICD-10-CM

## 2015-09-15 DIAGNOSIS — I10 Essential (primary) hypertension: Secondary | ICD-10-CM | POA: Diagnosis not present

## 2015-09-15 DIAGNOSIS — G894 Chronic pain syndrome: Secondary | ICD-10-CM | POA: Diagnosis not present

## 2015-09-15 NOTE — Patient Instructions (Addendum)
Right ankle brace by ACE or any brand you like (you can pick according to your price point). This will help keep your ankle and foot steady.   Good to see you! Follow up in 6 months or sooner as needed.

## 2015-09-15 NOTE — Progress Notes (Signed)
Patient ID: Cindy Fitzpatrick, female    DOB: October 24, 1936  Age: 79 y.o. MRN: [id number]  CC: Follow-up   HPI Cindy Fitzpatrick presents for follow up of pain concerns, HTN, and obesity.   1) HTN- stable today. Denies concerns.   2) Obesity- Diet- eating at [place] increasing walking, having trouble with shoes      3) Pain- Still the same, head is still bothering her occasionally when lying back in chairs.  Sprained right ankle in 1978 and having pain with walking.   Sad today due to finding out her best friend passed this morning.   4) Pt reports falling is still occuring and she is having dreams about sleep walking. She reports her son and grandson have kept the lights on in the living room in case she actually wanders in there. She reports she got a side rail for the bed, but still lives in independent living.   History Cindy Fitzpatrick has a past medical history of Arthritis; Depression; Diverticulitis; Blood in stool; Cancer ([place]); Chicken pox; Hay fever; GERD (gastroesophageal reflux disease); Heart disease; Heart murmur; Hyperlipidemia; and Colon polyps.   She has past surgical history that includes Knee surgery and Hernia repair.   Her family history is not on file.She reports that she has never smoked. She has never used smokeless tobacco. She reports that she does not drink alcohol or use illicit drugs.  Outpatient Prescriptions Prior to Visit  Medication Sig Dispense Refill  . acetaminophen (TYLENOL) 650 MG CR tablet Take 650 mg by mouth every 8 (eight) hours as needed for pain.    Marland Kitchen aspirin EC 81 MG tablet Take 81 mg by mouth daily.    . bisacodyl (DULCOLAX) 5 MG EC tablet Take 5 mg by mouth 2 (two) times daily as needed for moderate constipation.    Marland Kitchen buPROPion (WELLBUTRIN SR) 150 MG 12 hr tablet Take 1 tablet (150 mg total) by mouth daily. 30 tablet 2  . Calcium Carb-Cholecalciferol 600-800 MG-UNIT TABS Take 1 tablet by mouth 2 (two) times daily.    . clonazePAM  (KLONOPIN) 0.5 MG tablet Take 0.5-1 mg by mouth See admin instructions. Take 0.5mg  in the morning and at noon. Take 1mg  at bedtime    . cloNIDine (CATAPRES) 0.1 MG tablet Take 1 tablet (0.1 mg total) by mouth 2 (two) times daily. 60 tablet 5  . diltiazem (CARDIZEM) 30 MG tablet Take 1 tablet (30 mg total) by mouth 3 (three) times daily. 270 tablet 1  . docusate sodium (COLACE) 100 MG capsule Take 100 mg by mouth 2 (two) times daily.    Marland Kitchen losartan-hydrochlorothiazide (HYZAAR) 50-12.5 MG per tablet Take 1 tablet by mouth daily. 90 tablet 1  . Multiple Vitamins-Minerals (CENTRUM SILVER PO) Take 1 tablet by mouth daily.    . nitroGLYCERIN (NITRODUR - DOSED IN MG/24 HR) 0.4 mg/hr patch Apply one patch to skin once daily. Leave on 12-14 hours the remove for 10-12 hours before applying the next patch. 30 patch 5  . nitroGLYCERIN (NITROSTAT) 0.4 MG SL tablet Place 1 tablet (0.4 mg total) under the tongue as needed for chest pain. 25 tablet 0  . Omega-3 Fatty Acids (FISH OIL) 1200 MG CAPS Take 1 capsule by mouth 2 (two) times daily.     Marland Kitchen omeprazole (PRILOSEC) 20 MG capsule Take 20 mg by mouth 2 (two) times daily before a meal.    . Polyethyl Glycol-Propyl Glycol 0.4-0.3 % SOLN Apply 1 drop to eye as  needed (dry eyes).     . sertraline (ZOLOFT) 100 MG tablet Take 2 tablets by mouth at bedtime.     No facility-administered medications prior to visit.    ROS Review of Systems  Constitutional: Negative for fever, chills, diaphoresis and fatigue.  Respiratory: Negative for chest tightness, shortness of breath and wheezing.   Cardiovascular: Negative for chest pain, palpitations and leg swelling.  Gastrointestinal: Negative for nausea, vomiting and diarrhea.  Musculoskeletal: Positive for myalgias, back pain and arthralgias.       Shoulder pain bilaterally and foot/ankle pain on right  Skin: Negative for rash.  Neurological: Negative for dizziness and headaches.  Psychiatric/Behavioral: Negative for  suicidal ideas, confusion and sleep disturbance. The patient is not nervous/anxious.     Objective:  BP 112/70 mmHg  Pulse 98  Temp(Src) 98 F (36.7 C) (Oral)  Wt 177 lb (80.287 kg)  SpO2 97%  Physical Exam  Constitutional: She is oriented to person, place, and time. She appears well-developed and well-nourished. No distress.  HENT:  Head: Normocephalic and atraumatic.  Right Ear: External ear normal.  Left Ear: External ear normal.  Musculoskeletal: She exhibits tenderness. She exhibits no edema.  Tenderness of right dorsal lateral foot and metatarsal bones.   Neurological: She is alert and oriented to person, place, and time. Coordination abnormal.  Skin: Skin is warm and dry. No rash noted. She is not diaphoretic.  Psychiatric: She has a normal mood and affect. Her behavior is normal. Judgment and thought content normal.   Assessment & Plan:   There are no diagnoses linked to this encounter. I am having Ms. Hirons maintain her diltiazem, clonazePAM, omeprazole, Polyethyl Glycol-Propyl Glycol, acetaminophen, Fish Oil, bisacodyl, Multiple Vitamins-Minerals (CENTRUM SILVER PO), Calcium Carb-Cholecalciferol, nitroGLYCERIN, nitroGLYCERIN, losartan-hydrochlorothiazide, buPROPion, cloNIDine, sertraline, docusate sodium, and aspirin EC.  No orders of the defined types were placed in this encounter.     Follow-up: Return in about 6 months (around 03/14/2016) for Follow up .

## 2015-09-21 NOTE — Assessment & Plan Note (Signed)
Encouraged being active and healthy diet. Pt does not seem to be motivated and is feeling sad today due to the passing of a friend.

## 2015-09-21 NOTE — Assessment & Plan Note (Signed)
BP Readings from Last 3 Encounters:  09/15/15 112/70  06/20/15 122/80  06/13/15 118/60   Stable today. Will continue current regimen.

## 2015-09-21 NOTE — Assessment & Plan Note (Signed)
Same today. Pt reports some intermittent head tenderness after repair of scalp laceration. It was not tender to palpation today and look well healed. Having right ankle pain since 1978 she reports. She was advised to get an ankle brace at her local pharmacy. She was agreeable to this as she declined seeing orthopedics or podiatry for care.

## 2015-09-23 ENCOUNTER — Other Ambulatory Visit: Payer: Self-pay | Admitting: Nurse Practitioner

## 2015-10-10 ENCOUNTER — Other Ambulatory Visit: Payer: Self-pay | Admitting: Nurse Practitioner

## 2015-10-24 ENCOUNTER — Other Ambulatory Visit: Payer: Self-pay | Admitting: Internal Medicine

## 2015-10-24 ENCOUNTER — Other Ambulatory Visit: Payer: Self-pay | Admitting: Nurse Practitioner

## 2015-11-17 ENCOUNTER — Other Ambulatory Visit: Payer: Self-pay | Admitting: Internal Medicine

## 2015-11-17 NOTE — Telephone Encounter (Signed)
Received refill request electronically Last refill 04/18/15 #30/5 Last office visit 09/15/15 Is it okay to refill?

## 2015-12-12 ENCOUNTER — Emergency Department: Payer: Medicare Other

## 2015-12-12 ENCOUNTER — Encounter: Payer: Self-pay | Admitting: Emergency Medicine

## 2015-12-12 ENCOUNTER — Emergency Department
Admission: EM | Admit: 2015-12-12 | Discharge: 2015-12-12 | Disposition: A | Discharge status: Home / Self Care | Payer: Medicare Other | Attending: Emergency Medicine | Admitting: Emergency Medicine

## 2015-12-12 DIAGNOSIS — W1839XA Other fall on same level, initial encounter: Secondary | ICD-10-CM | POA: Insufficient documentation

## 2015-12-12 DIAGNOSIS — Z79899 Other long term (current) drug therapy: Secondary | ICD-10-CM | POA: Diagnosis not present

## 2015-12-12 DIAGNOSIS — Y998 Other external cause status: Secondary | ICD-10-CM | POA: Insufficient documentation

## 2015-12-12 DIAGNOSIS — I10 Essential (primary) hypertension: Secondary | ICD-10-CM | POA: Diagnosis not present

## 2015-12-12 DIAGNOSIS — Z7982 Long term (current) use of aspirin: Secondary | ICD-10-CM | POA: Diagnosis not present

## 2015-12-12 DIAGNOSIS — Y9389 Activity, other specified: Secondary | ICD-10-CM | POA: Insufficient documentation

## 2015-12-12 DIAGNOSIS — S0012XA Contusion of left eyelid and periocular area, initial encounter: Secondary | ICD-10-CM | POA: Insufficient documentation

## 2015-12-12 DIAGNOSIS — S0990XA Unspecified injury of head, initial encounter: Secondary | ICD-10-CM | POA: Diagnosis not present

## 2015-12-12 DIAGNOSIS — R55 Syncope and collapse: Secondary | ICD-10-CM | POA: Insufficient documentation

## 2015-12-12 DIAGNOSIS — Y92002 Bathroom of unspecified non-institutional (private) residence single-family (private) house as the place of occurrence of the external cause: Secondary | ICD-10-CM | POA: Insufficient documentation

## 2015-12-12 LAB — CBC WITH DIFFERENTIAL/PLATELET
BASOS ABS: 0.1 10*3/uL (ref 0–0.1)
BASOS PCT: 1 %
EOS ABS: 0.8 10*3/uL — AB (ref 0–0.7)
Eosinophils Relative: 13 %
HEMATOCRIT: 31.4 % — AB (ref 35.0–47.0)
HEMOGLOBIN: 9.8 g/dL — AB (ref 12.0–16.0)
Lymphocytes Relative: 16 %
Lymphs Abs: 1 10*3/uL (ref 1.0–3.6)
MCH: 21.8 pg — ABNORMAL LOW (ref 26.0–34.0)
MCHC: 31.2 g/dL — ABNORMAL LOW (ref 32.0–36.0)
MCV: 69.8 fL — ABNORMAL LOW (ref 80.0–100.0)
Monocytes Absolute: 0.6 10*3/uL (ref 0.2–0.9)
Monocytes Relative: 10 %
NEUTROS ABS: 3.8 10*3/uL (ref 1.4–6.5)
NEUTROS PCT: 60 %
Platelets: 244 10*3/uL (ref 150–440)
RBC: 4.49 MIL/uL (ref 3.80–5.20)
RDW: 17 % — ABNORMAL HIGH (ref 11.5–14.5)
WBC: 6.3 10*3/uL (ref 3.6–11.0)

## 2015-12-12 LAB — COMPREHENSIVE METABOLIC PANEL
ALBUMIN: 4.2 g/dL (ref 3.5–5.0)
ALK PHOS: 69 U/L (ref 38–126)
ALT: 15 U/L (ref 14–54)
ANION GAP: 9 (ref 5–15)
AST: 21 U/L (ref 15–41)
BUN: 13 mg/dL (ref 6–20)
CALCIUM: 9.6 mg/dL (ref 8.9–10.3)
CHLORIDE: 105 mmol/L (ref 101–111)
CO2: 27 mmol/L (ref 22–32)
CREATININE: 0.78 mg/dL (ref 0.44–1.00)
GFR calc non Af Amer: >60 mL/min (ref >60)
GLUCOSE: 116 mg/dL — AB (ref 65–99)
Potassium: 3.4 mmol/L — ABNORMAL LOW (ref 3.5–5.1)
SODIUM: 141 mmol/L (ref 135–145)
Total Bilirubin: 0.5 mg/dL (ref 0.3–1.2)
Total Protein: 6.8 g/dL (ref 6.5–8.1)

## 2015-12-12 LAB — TROPONIN I: TROPONIN I: 0.03 ng/mL (ref <0.031)

## 2015-12-12 NOTE — Discharge Instructions (Signed)
Concussion, Adult  A concussion, or closed-head injury, is a brain injury caused by a direct blow to the head or by a quick and sudden movement (jolt) of the head or neck. Concussions are usually not life-threatening. Even so, the effects of a concussion can be serious. If you have had a concussion before, you are more likely to experience concussion-like symptoms after a direct blow to the head.   CAUSES  · Direct blow to the head, such as from running into another player during a soccer game, being hit in a fight, or hitting your head on a hard surface.  · A jolt of the head or neck that causes the brain to move back and forth inside the skull, such as in a car crash.  SIGNS AND SYMPTOMS  The signs of a concussion can be hard to notice. Early on, they may be missed by you, family members, and health care providers. You may look fine but act or feel differently.  Symptoms are usually temporary, but they may last for days, weeks, or even longer. Some symptoms may appear right away while others may not show up for hours or days. Every head injury is different. Symptoms include:  · Mild to moderate headaches that will not go away.  · A feeling of pressure inside your head.  · Having more trouble than usual:    Learning or remembering things you have heard.    Answering questions.    Paying attention or concentrating.    Organizing daily tasks.    Making decisions and solving problems.  · Slowness in thinking, acting or reacting, speaking, or reading.  · Getting lost or being easily confused.  · Feeling tired all the time or lacking energy (fatigued).  · Feeling drowsy.  · Sleep disturbances.    Sleeping more than usual.    Sleeping less than usual.    Trouble falling asleep.    Trouble sleeping (insomnia).  · Loss of balance or feeling lightheaded or dizzy.  · Nausea or vomiting.  · Numbness or tingling.  · Increased sensitivity to:    Sounds.    Lights.    Distractions.  · Vision problems or eyes that tire  easily.  · Diminished sense of taste or smell.  · Ringing in the ears.  · Mood changes such as feeling sad or anxious.  · Becoming easily irritated or angry for little or no reason.  · Lack of motivation.  · Seeing or hearing things other people do not see or hear (hallucinations).  DIAGNOSIS  Your health care provider can usually diagnose a concussion based on a description of your injury and symptoms. He or she will ask whether you passed out (lost consciousness) and whether you are having trouble remembering events that happened right before and during your injury.  Your evaluation might include:  · A brain scan to look for signs of injury to the brain. Even if the test shows no injury, you may still have a concussion.  · Blood tests to be sure other problems are not present.  TREATMENT  · Concussions are usually treated in an emergency department, in urgent care, or at a clinic. You may need to stay in the hospital overnight for further treatment.  · Tell your health care provider if you are taking any medicines, including prescription medicines, over-the-counter medicines, and natural remedies. Some medicines, such as blood thinners (anticoagulants) and aspirin, may increase the chance of complications. Also tell your health care   provider whether you have had alcohol or are taking illegal drugs. This information may affect treatment.  · Your health care provider will send you home with important instructions to follow.  · How fast you will recover from a concussion depends on many factors. These factors include how severe your concussion is, what part of your brain was injured, your age, and how healthy you were before the concussion.  · Most people with mild injuries recover fully. Recovery can take time. In general, recovery is slower in older persons. Also, persons who have had a concussion in the past or have other medical problems may find that it takes longer to recover from their current injury.  HOME  CARE INSTRUCTIONS  General Instructions  · Carefully follow the directions your health care provider gave you.  · Only take over-the-counter or prescription medicines for pain, discomfort, or fever as directed by your health care provider.  · Take only those medicines that your health care provider has approved.  · Do not drink alcohol until your health care provider says you are well enough to do so. Alcohol and certain other drugs may slow your recovery and can put you at risk of further injury.  · If it is harder than usual to remember things, write them down.  · If you are easily distracted, try to do one thing at a time. For example, do not try to watch TV while fixing dinner.  · Talk with family members or close friends when making important decisions.  · Keep all follow-up appointments. Repeated evaluation of your symptoms is recommended for your recovery.  · Watch your symptoms and tell others to do the same. Complications sometimes occur after a concussion. Older adults with a brain injury may have a higher risk of serious complications, such as a blood clot on the brain.  · Tell your teachers, school nurse, school counselor, coach, athletic trainer, or work manager about your injury, symptoms, and restrictions. Tell them about what you can or cannot do. They should watch for:    Increased problems with attention or concentration.    Increased difficulty remembering or learning new information.    Increased time needed to complete tasks or assignments.    Increased irritability or decreased ability to cope with stress.    Increased symptoms.  · Rest. Rest helps the brain to heal. Make sure you:    Get plenty of sleep at night. Avoid staying up late at night.    Keep the same bedtime hours on weekends and weekdays.    Rest during the day. Take daytime naps or rest breaks when you feel tired.  · Limit activities that require a lot of thought or concentration. These include:    Doing homework or job-related  work.    Watching TV.    Working on the computer.  · Avoid any situation where there is potential for another head injury (football, hockey, soccer, basketball, martial arts, downhill snow sports and horseback riding). Your condition will get worse every time you experience a concussion. You should avoid these activities until you are evaluated by the appropriate follow-up health care providers.  Returning To Your Regular Activities  You will need to return to your normal activities slowly, not all at once. You must give your body and brain enough time for recovery.  · Do not return to sports or other athletic activities until your health care provider tells you it is safe to do so.  · Ask   your health care provider when you can drive, ride a bicycle, or operate heavy machinery. Your ability to react may be slower after a brain injury. Never do these activities if you are dizzy.  · Ask your health care provider about when you can return to work or school.  Preventing Another Concussion  It is very important to avoid another brain injury, especially before you have recovered. In rare cases, another injury can lead to permanent brain damage, brain swelling, or death. The risk of this is greatest during the first 7-10 days after a head injury. Avoid injuries by:  · Wearing a seat belt when riding in a car.  · Drinking alcohol only in moderation.  · Wearing a helmet when biking, skiing, skateboarding, skating, or doing similar activities.  · Avoiding activities that could lead to a second concussion, such as contact or recreational sports, until your health care provider says it is okay.  · Taking safety measures in your home.    Remove clutter and tripping hazards from floors and stairways.    Use grab bars in bathrooms and handrails by stairs.    Place non-slip mats on floors and in bathtubs.    Improve lighting in dim areas.  SEEK MEDICAL CARE IF:  · You have increased problems paying attention or  concentrating.  · You have increased difficulty remembering or learning new information.  · You need more time to complete tasks or assignments than before.  · You have increased irritability or decreased ability to cope with stress.  · You have more symptoms than before.  Seek medical care if you have any of the following symptoms for more than 2 weeks after your injury:  · Lasting (chronic) headaches.  · Dizziness or balance problems.  · Nausea.  · Vision problems.  · Increased sensitivity to noise or light.  · Depression or mood swings.  · Anxiety or irritability.  · Memory problems.  · Difficulty concentrating or paying attention.  · Sleep problems.  · Feeling tired all the time.  SEEK IMMEDIATE MEDICAL CARE IF:  · You have severe or worsening headaches. These may be a sign of a blood clot in the brain.  · You have weakness (even if only in one hand, leg, or part of the face).  · You have numbness.  · You have decreased coordination.  · You vomit repeatedly.  · You have increased sleepiness.  · One pupil is larger than the other.  · You have convulsions.  · You have slurred speech.  · You have increased confusion. This may be a sign of a blood clot in the brain.  · You have increased restlessness, agitation, or irritability.  · You are unable to recognize people or places.  · You have neck pain.  · It is difficult to wake you up.  · You have unusual behavior changes.  · You lose consciousness.  MAKE SURE YOU:  · Understand these instructions.  · Will watch your condition.  · Will get help right away if you are not doing well or get worse.     This information is not intended to replace advice given to you by your health care provider. Make sure you discuss any questions you have with your health care provider.     Document Released: 01/01/2004 Document Revised: 11/01/2014 Document Reviewed: 05/03/2013  Elsevier Interactive Patient Education ©2016 [employer]

## 2015-12-12 NOTE — ED Notes (Signed)
Assisted patient to bedpan. Pad changed and patient repositioned. Patient resting comfortably with family at bedside.

## 2015-12-12 NOTE — ED Notes (Signed)
41 yof from [place] presents via EMS after ground level fall. +head injury with left periorbital swelling, left elbow and right thumb pain/bruising. C-collar.

## 2015-12-12 NOTE — ED Provider Notes (Signed)
Time Seen: Approximately *0 900 I have reviewed the triage notes  Chief Complaint: Fall   History of Present Illness: Cindy Fitzpatrick is a 80 y.o. female who presents after what sounds like a syncopal episode at home. Patient was on the toilet at the [facility] and apparently had a brief syncopal episode. She fell forward landing primarily on the left side of her head and has some bruising in the right thumb area. Patient was transported here by EMS uneventfully with a c-collar in place. The patient herself denies any chest pain or shortness of breath. She states she has some pain in the left side of her head and she is awake alert and oriented 3 but seems to have some short-term memory deficits and her interview here in emergency department. Her sons arrived later and states that she seems to be at baseline as far as her head injury is concerned. Past Medical History  Diagnosis Date  . Arthritis   . Depression   . Diverticulitis   . Blood in stool   . Cancer ([place])     skin  . Chicken pox     SHINGLES TWICE  . Hay fever   . GERD (gastroesophageal reflux disease)   . Heart disease   . Heart murmur   . Hyperlipidemia   . Colon polyps     Patient Active Problem List   Diagnosis Date Noted  . Need for prophylactic measure 07/03/2015  . Scalp laceration 07/03/2015  . Chronic pain syndrome 02/13/2015  . Fatigue 02/13/2015  . HTN (hypertension) 02/13/2015  . Obesity (BMI 30-39.9) 12/01/2014  . Routine general medical examination at a health care facility 12/01/2014    Past Surgical History  Procedure Laterality Date  . Knee surgery    . Hernia repair      Past Surgical History  Procedure Laterality Date  . Knee surgery    . Hernia repair      Current Outpatient Rx  Name  Route  Sig  Dispense  Refill  . acetaminophen (TYLENOL) 650 MG CR tablet   Oral   Take 650 mg by mouth every 8 (eight) hours as needed for pain.         Marland Kitchen aspirin EC 81 MG tablet    Oral   Take 81 mg by mouth daily.         . bisacodyl (DULCOLAX) 5 MG EC tablet   Oral   Take 5 mg by mouth 2 (two) times daily as needed for moderate constipation.         Marland Kitchen buPROPion (WELLBUTRIN SR) 150 MG 12 hr tablet   Oral   Take 1 tablet (150 mg total) by mouth daily.   30 tablet   2   . Calcium Carb-Cholecalciferol 600-800 MG-UNIT TABS   Oral   Take 1 tablet by mouth 2 (two) times daily.         . clonazePAM (KLONOPIN) 0.5 MG tablet   Oral   Take 0.5-1 mg by mouth See admin instructions. Take 0.5mg  in the morning and at noon. Take 1mg  at bedtime         . cloNIDine (CATAPRES) 0.1 MG tablet      TAKE ONE TABLET TWICE A DAY   60 tablet   1   . diltiazem (CARDIZEM) 30 MG tablet      TAKE 1 TABLET 3 TIMES DAILY   270 tablet   2   . docusate sodium (COLACE) 100 MG  capsule   Oral   Take 100 mg by mouth 2 (two) times daily.         Marland Kitchen losartan-hydrochlorothiazide (HYZAAR) 50-12.5 MG tablet      TAKE 1 TABLET EVERY DAY   90 tablet   3   . Multiple Vitamins-Minerals (CENTRUM SILVER PO)   Oral   Take 1 tablet by mouth daily.         . nitroGLYCERIN (NITRODUR - DOSED IN MG/24 HR) 0.4 mg/hr patch      APPLY ONE PATCH TO SKIN ONCE A DAY LEAVE ON 12 TO 14 HOURS THEN REMOVE FOR 10 TO 12 HOURS BEFORE APPLYING THE NEXT PATCH   30 patch   2   . NITROSTAT 0.4 MG SL tablet      DISSOLVE ONE TABLET UNDER TONGUE IF NEEDED FOR CHEST PAIN   25 tablet   1   . Omega-3 Fatty Acids (FISH OIL) 1200 MG CAPS   Oral   Take 1 capsule by mouth 2 (two) times daily.          Marland Kitchen omeprazole (PRILOSEC) 20 MG capsule   Oral   Take 20 mg by mouth 2 (two) times daily before a meal.         . Polyethyl Glycol-Propyl Glycol 0.4-0.3 % SOLN   Ophthalmic   Apply 1 drop to eye as needed (dry eyes).          . sertraline (ZOLOFT) 100 MG tablet   Oral   Take 2 tablets by mouth at bedtime.           Allergies:  Codeine; Hydrocodone-acetaminophen; Meperidine; Niacin  er; Other; Propoxyphene; Solifenacin; Sulfa antibiotics; Fish-derived products; Nabumetone; and Oxycodone-acetaminophen  Family History: History reviewed. No pertinent family history.  Social History: Social History  Substance Use Topics  . Smoking status: Never Smoker   . Smokeless tobacco: Never Used  . Alcohol Use: No     Review of Systems:   10 point review of systems was performed and was otherwise negative:  Constitutional: No fever Eyes: No visual disturbances ENT: No sore throat, ear pain Cardiac: No chest pain Respiratory: No shortness of breath, wheezing, or stridor Abdomen: No abdominal pain, no vomiting, No diarrhea Endocrine: No weight loss, No night sweats Extremities: No peripheral edema, cyanosis Skin: No rashes, easy bruising Neurologic: No focal weakness, trouble with speech or swollowing Urologic: No dysuria, Hematuria, or urinary frequency   Physical Exam:  ED Triage Vitals  Enc Vitals Group     BP 12/12/15 0840 179/69 mmHg     Pulse Rate 12/12/15 0840 85     Resp 12/12/15 0840 18     Temp 12/12/15 0840 97.9 F (36.6 C)     Temp Source 12/12/15 0840 Oral     SpO2 12/12/15 0840 100 %     Weight 12/12/15 0840 172 lb (78.019 kg)     Height 12/12/15 0840 5\' 2"  (1.575 m)     Head Cir --      Peak Flow --      Pain Score 12/12/15 1102 8     Pain Loc --      Pain Edu? --      Excl. in [place]? --     General: Awake , Alert , and Oriented times 3; GCS 15 Head: Normal cephalic , atraumatic patient has a large left-sided periorbital hematoma Eyes: Pupils equal , round, reactive to light extraocular eye movements are intact there's no midface instability Nose/Throat: No  nasal drainage, patent upper airway without erythema or exudate.  Neck: Supple, Full range of motion, No anterior adenopathy or palpable thyroid masses mild tenderness right side of the neck there is no crepitus or step-off noted with full range of motion. Lungs: Clear to ascultation  without wheezes , rhonchi, or rales Heart: Regular rate, regular rhythm without murmurs , gallops , or rubs Abdomen: Soft, non tender without rebound, guarding , or rigidity; bowel sounds positive and symmetric in all 4 quadrants. No organomegaly .        Extremities: 2 plus symmetric pulses. No edema, clubbing or cyanosis Neurologic: normal ambulation, Motor symmetric without deficits, sensory intact Skin: warm, dry, no rashes Rectal exam is guaiac negative, normal sphincter tone  Labs:   All laboratory work was reviewed including any pertinent negatives or positives listed below:  Labs Reviewed  CBC WITH DIFFERENTIAL/PLATELET - Abnormal; Notable for the following:    Hemoglobin 9.8 (*)    HCT 31.4 (*)    MCV 69.8 (*)    MCH 21.8 (*)    MCHC 31.2 (*)    RDW 17.0 (*)    Eosinophils Absolute 0.8 (*)    All other components within normal limits  COMPREHENSIVE METABOLIC PANEL - Abnormal; Notable for the following:    Potassium 3.4 (*)    Glucose, Bld 116 (*)    All other components within normal limits  TROPONIN I    EKG: ED ECG REPORT I, Daymon Larsen, the attending physician, personally viewed and interpreted this ECG.  Date: 12/12/2015 EKG Time: 0 850 Rate: 87 Rhythm: normal sinus rhythm QRS Axis: normal Intervals: Right bundle-branch block ST/T Wave abnormalities: normal Conduction Disturbances: none Narrative Interpretation: unremarkable No acute ischemic changes are noted  Radiology:   CT CERVICAL SPINE FINDINGS  Negative for acute fracture or traumatic malalignment. No prevertebral edema. No gross cervical canal hematoma.  Advanced multilevel facet arthropathy with notable subchondral erosive/cystic changes at C2-3. 3 mm of C4-5 anterolisthesis associated with advanced facet arthropathy, chronic based on scanogram from 2013 head CT. Multilevel degenerative disc disease, advanced from C5-6 to C7-T1 with bone on bone contact and sclerosis.  IMPRESSION: 1.  No evidence of acute intracranial or cervical spine injury. 2. Left periorbital hematoma without fracture. 3. Advanced cervical disc and facet degeneration with chronic C4-5 anterolisthesis.   Electronically Signed By: Monte Fantasia M.D. On: 12/12/2015 09:42          DG Hand Complete Right (Final result) Result time: 12/12/15 09:19:47   Final result by Rad Results In Interface (12/12/15 09:19:47)   Narrative:   CLINICAL DATA: 80 year old who fell last night and injured the right hand. Bruising involving the dorsal surface near the base of the thumb and 1st metacarpal. Initial encounter.  EXAM: RIGHT HAND - COMPLETE 3+ VIEW  COMPARISON: None.  FINDINGS: No evidence of acute fracture or dislocation. Osseous demineralization. Moderate joint space narrowing involving the IP joints of the fingers and thumb. Moderate narrowing of the 1st MCP joint space.  Radiocarpal joint space narrowing at the wrist. Severe narrowing of the scaphotrapezium and scaphotrapezoid joints in the wrist. Moderate narrowing of the trapezium-1st metacarpal and trapezoid-2nd metacarpal joints with modeling of the base of the 2nd metacarpal. Calcification in the triangular fibrocartilage complex of the wrist.  IMPRESSION: 1. No acute osseous abnormality. 2. Osteoarthritis involving the hand and wrist. 3. CPPD involving the wrist. 4. Osseous demineralization.        EXAM: CT HEAD WITHOUT CONTRAST  CT CERVICAL  SPINE WITHOUT CONTRAST  TECHNIQUE: Multidetector CT imaging of the head and cervical spine was performed following the standard protocol without intravenous contrast. Multiplanar CT image reconstructions of the cervical spine were also generated.  COMPARISON: 06/11/2015 head CT  FINDINGS: CT HEAD FINDINGS  Skull and Sinuses:Left periorbital hematoma without fracture. No hemo sinus.  Visualized orbits: Left cataract resection. No posttraumatic finding.  Brain: No  evidence of acute infarction, hemorrhage, hydrocephalus, or mass lesion/mass effect. Age congruent generalized cerebral volume loss and deep white matter small vessel ischemic gliosis. I personally reviewed the radiologic studies  ED Course:  Patient does not appear to have any pulmonary emboli risk factors per history and given her current clinical presentation especially with her straining on the toilet I felt she most likely had a vasovagal syncope the patient's otherwise stable as far as her head trauma and CAT scan of the head and neck showed no significant intracerebral abnormalities. Reviewed the patient's laboratory work showed a slight decrease in her hemoglobin. She had a rectal exam which was guaiac negative. I felt he was not significantly contributory to her syncopal episode.   Assessment:  Syncope Acute closed head injury  Final Clinical Impression:   Final diagnoses:  Head trauma, initial encounter     Plan:  Outpatient management Patient has family felt comfortable taking her back to the nursing facility. They're advised to return here especially if she has a altered mental status, persistent vomiting, fever, or any other new concerns.            Daymon Larsen, MD 12/12/15 [phone]

## 2015-12-18 ENCOUNTER — Ambulatory Visit (INDEPENDENT_AMBULATORY_CARE_PROVIDER_SITE_OTHER): Payer: Medicare Other | Admitting: Nurse Practitioner

## 2015-12-18 VITALS — BP 118/62 | HR 78 | Temp 98.3°F | Resp 18 | Ht 63.0 in | Wt 166.8 lb

## 2015-12-18 DIAGNOSIS — Z13820 Encounter for screening for osteoporosis: Secondary | ICD-10-CM | POA: Diagnosis not present

## 2015-12-18 DIAGNOSIS — Y92009 Unspecified place in unspecified non-institutional (private) residence as the place of occurrence of the external cause: Secondary | ICD-10-CM

## 2015-12-18 DIAGNOSIS — W19XXXD Unspecified fall, subsequent encounter: Secondary | ICD-10-CM

## 2015-12-18 MED ORDER — NITROGLYCERIN 0.4 MG/HR TD PT24: MEDICATED_PATCH | TRANSDERMAL | Status: DC

## 2015-12-18 NOTE — Progress Notes (Signed)
Patient ID: Cindy Fitzpatrick, female    DOB: [birth year]  Age: 80 y.o. MRN: [id number]  CC: Follow-up   HPI Cindy Fitzpatrick presents for ED follow up for fall.   1) patient was seen in the [facility] ED on 12/12/2015 She had a fall, patient does not feel like it was syncopal, but the ED felt it was a small syncopal episode. Her blood pressure was elevated at 179/69 Her GCS was 15 Labs were abnormal  Imaging:  EKG- RBBB and ST/T wave was without significant abnormalities CT  Cervical Spine no acute cervical spine injury, left periorbital hematoma without fracture Advanced cervical disc and facet degeneration of chronic C4-5 anteriolisthesis X-ray of right hand was completed with findings including osteoarthritis of the wrist and hand, osseous demineralization, no acute osseous abnormalities were found  History Cindy Fitzpatrick has a past medical history of Arthritis; Depression; Diverticulitis; Blood in stool; Cancer ([place]); Chicken pox; Hay fever; GERD (gastroesophageal reflux disease); Heart disease; Heart murmur; Hyperlipidemia; and Colon polyps.   She has past surgical history that includes Knee surgery and Hernia repair.   Her family history is not on file.She reports that she has never smoked. She has never used smokeless tobacco. She reports that she does not drink alcohol or use illicit drugs.  Outpatient Prescriptions Prior to Visit  Medication Sig Dispense Refill  . acetaminophen (TYLENOL) 650 MG CR tablet Take 650 mg by mouth every 8 (eight) hours as needed for pain.    Marland Kitchen aspirin EC 81 MG tablet Take 81 mg by mouth daily.    . bisacodyl (DULCOLAX) 5 MG EC tablet Take 5 mg by mouth 2 (two) times daily as needed for moderate constipation.    Marland Kitchen buPROPion (WELLBUTRIN SR) 150 MG 12 hr tablet Take 1 tablet (150 mg total) by mouth daily. 30 tablet 2  . Calcium Carb-Cholecalciferol 600-800 MG-UNIT TABS Take 1 tablet by mouth 2 (two) times daily.    . clonazePAM (KLONOPIN) 0.5  MG tablet Take 0.5-1 mg by mouth See admin instructions. Take 0.5mg  in the morning and at noon. Take 1mg  at bedtime    . diltiazem (CARDIZEM) 30 MG tablet TAKE 1 TABLET 3 TIMES DAILY 270 tablet 2  . docusate sodium (COLACE) 100 MG capsule Take 100 mg by mouth 2 (two) times daily.    Marland Kitchen losartan-hydrochlorothiazide (HYZAAR) 50-12.5 MG tablet TAKE 1 TABLET EVERY DAY 90 tablet 3  . Multiple Vitamins-Minerals (CENTRUM SILVER PO) Take 1 tablet by mouth daily.    Marland Kitchen NITROSTAT 0.4 MG SL tablet DISSOLVE ONE TABLET UNDER TONGUE IF NEEDED FOR CHEST PAIN 25 tablet 1  . Omega-3 Fatty Acids (FISH OIL) 1200 MG CAPS Take 1 capsule by mouth 2 (two) times daily.     Marland Kitchen omeprazole (PRILOSEC) 20 MG capsule Take 20 mg by mouth 2 (two) times daily before a meal.    . Polyethyl Glycol-Propyl Glycol 0.4-0.3 % SOLN Apply 1 drop to eye as needed (dry eyes).     . sertraline (ZOLOFT) 100 MG tablet Take 2 tablets by mouth at bedtime.    . cloNIDine (CATAPRES) 0.1 MG tablet TAKE ONE TABLET TWICE A DAY 60 tablet 1  . nitroGLYCERIN (NITRODUR - DOSED IN MG/24 HR) 0.4 mg/hr patch APPLY ONE PATCH TO SKIN ONCE A DAY LEAVE ON 12 TO 14 HOURS THEN REMOVE FOR 10 TO 12 HOURS BEFORE APPLYING THE NEXT PATCH 30 patch 2   No facility-administered medications prior to visit.    ROS Review  of Systems  Constitutional: Negative for fever, chills, diaphoresis and fatigue.  Respiratory: Negative for chest tightness, shortness of breath and wheezing.   Cardiovascular: Negative for chest pain, palpitations and leg swelling.  Gastrointestinal: Negative for nausea, vomiting and diarrhea.  Musculoskeletal: Positive for arthralgias and gait problem.  Skin: Positive for color change. Negative for rash.  Neurological: Negative for dizziness, numbness and headaches.  Psychiatric/Behavioral: The patient is nervous/anxious.     Objective:  BP 118/62 mmHg  Pulse 78  Temp(Src) 98.3 F (36.8 C) (Oral)  Resp 18  Ht 5\' 3"  (1.6 m)  Wt 166 lb 12 oz  (75.637 kg)  BMI 29.55 kg/m2  SpO2 98%  Physical Exam  Constitutional: She is oriented to person, place, and time. She appears well-developed and well-nourished. No distress.  HENT:  Head: Normocephalic and atraumatic.  Right Ear: External ear normal.  Left Ear: External ear normal.  Eyes: EOM are normal. Pupils are equal, round, and reactive to light. Right eye exhibits no discharge. Left eye exhibits no discharge. No scleral icterus.  Cardiovascular: Normal rate and regular rhythm.  Exam reveals no gallop and no friction rub.   Murmur heard. Pulmonary/Chest: Effort normal and breath sounds normal. No respiratory distress. She has no wheezes. She has no rales. She exhibits no tenderness.  Musculoskeletal:       Arms: Right thumb bruising improving  Neurological: She is alert and oriented to person, place, and time. Coordination abnormal.  Using rolling walker  Skin: Skin is warm and dry. No rash noted. She is not diaphoretic.     Hematoma left side of face is resolving well   Psychiatric: She has a normal mood and affect. Her behavior is normal. Judgment and thought content normal.   Assessment & Plan:   Cindy Fitzpatrick was seen today for follow-up.  Diagnoses and all orders for this visit:  Screening for osteoporosis -     DG Bone Density; Future  Fall at home, subsequent encounter  Other orders -     nitroGLYCERIN (NITRODUR - DOSED IN MG/24 HR) 0.4 mg/hr patch; APPLY ONE PATCH TO SKIN ONCE A DAY LEAVE ON 12 TO 14 HOURS THEN REMOVE FOR 10 TO 12 HOURS BEFORE APPLYING THE NEXT PATCH   I am having Cindy Fitzpatrick maintain her clonazePAM, omeprazole, Polyethyl Glycol-Propyl Glycol, acetaminophen, Fish Oil, bisacodyl, Multiple Vitamins-Minerals (CENTRUM SILVER PO), Calcium Carb-Cholecalciferol, buPROPion, sertraline, docusate sodium, aspirin EC, diltiazem, losartan-hydrochlorothiazide, NITROSTAT, and nitroGLYCERIN.  Meds ordered this encounter  Medications  . nitroGLYCERIN (NITRODUR - DOSED  IN MG/24 HR) 0.4 mg/hr patch    Sig: APPLY ONE PATCH TO SKIN ONCE A DAY LEAVE ON 12 TO 14 HOURS THEN REMOVE FOR 10 TO 12 HOURS BEFORE APPLYING THE NEXT PATCH    Dispense:  30 patch    Refill:  2    Order Specific Question:  Supervising Provider    Answer:  Crecencio Mc [[id number]]     Follow-up: Return in about 4 weeks (around 01/15/2016) for Follow up .

## 2015-12-18 NOTE — Patient Instructions (Addendum)
We will call to set up your bone density screening.   Keep active, Ice your head

## 2015-12-18 NOTE — Progress Notes (Signed)
Pre visit review using our clinic review tool, if applicable. No additional management support is needed unless otherwise documented below in the visit note. 

## 2015-12-22 ENCOUNTER — Other Ambulatory Visit: Payer: Self-pay | Admitting: Nurse Practitioner

## 2015-12-22 NOTE — Telephone Encounter (Signed)
Refilled in December. Please advise? 

## 2015-12-25 ENCOUNTER — Encounter: Payer: Self-pay | Admitting: Nurse Practitioner

## 2015-12-25 DIAGNOSIS — Z13820 Encounter for screening for osteoporosis: Secondary | ICD-10-CM | POA: Insufficient documentation

## 2015-12-25 DIAGNOSIS — W19XXXA Unspecified fall, initial encounter: Secondary | ICD-10-CM | POA: Insufficient documentation

## 2015-12-25 DIAGNOSIS — Y92009 Unspecified place in unspecified non-institutional (private) residence as the place of occurrence of the external cause: Secondary | ICD-10-CM

## 2015-12-25 NOTE — Assessment & Plan Note (Signed)
Due to findings on x-ray of hands we will check for osteoporosis with a DEXA scan this was ordered today

## 2015-12-25 NOTE — Assessment & Plan Note (Addendum)
Patient was evaluated in the ED and this is her follow-up She appears to be healing well and has not experienced any vomiting, nausea, dizziness She is prone to falls and lives in the independent section of [facility] She was cautioned about using her rolling walker frequently Patient does not wish to do any PT at this time for gait instability  I have personally reviewed the discharge summary from the emergency room and the individual imaging  I have spent 25 minutes face-to-face with the patient with greater than 50% of the time spent on counseling, discussing fall safety, looking at treatment options, discussing home care for hematoma.

## 2015-12-31 ENCOUNTER — Encounter: Payer: Self-pay | Admitting: Nurse Practitioner

## 2016-01-19 ENCOUNTER — Ambulatory Visit
Admission: RE | Admit: 2016-01-19 | Discharge: 2016-01-19 | Disposition: A | Discharge status: Home / Self Care | Payer: Medicare Other | Source: Ambulatory Visit | Attending: Nurse Practitioner | Admitting: Nurse Practitioner

## 2016-01-19 ENCOUNTER — Encounter: Payer: Self-pay | Admitting: Nurse Practitioner

## 2016-01-19 ENCOUNTER — Ambulatory Visit (INDEPENDENT_AMBULATORY_CARE_PROVIDER_SITE_OTHER): Payer: Medicare Other | Admitting: Nurse Practitioner

## 2016-01-19 VITALS — BP 102/60 | HR 65 | Temp 98.0°F | Resp 14 | Ht 63.0 in | Wt 167.2 lb

## 2016-01-19 DIAGNOSIS — M8580 Other specified disorders of bone density and structure, unspecified site: Secondary | ICD-10-CM | POA: Insufficient documentation

## 2016-01-19 DIAGNOSIS — Z13820 Encounter for screening for osteoporosis: Secondary | ICD-10-CM

## 2016-01-19 DIAGNOSIS — F3170 Bipolar disorder, currently in remission, most recent episode unspecified: Secondary | ICD-10-CM

## 2016-01-19 NOTE — Patient Instructions (Signed)
See you in 6 months unless you need me sooner!

## 2016-01-19 NOTE — Progress Notes (Signed)
Patient ID: Cindy Fitzpatrick, female    DOB: 10-12-1936  Age: 80 y.o. MRN: [id number]  CC: Follow-up   HPI Cindy Fitzpatrick presents for follow up of bone density results.  1) Pt shows osteopenia. She is taking recommended daily calcium and vitamin D. She falls often at home (1 every month or so).   2) Eye exam on Friday at [facility] center- per pt okay to return in 1 year   3) Decreased klonopin from psychiatrist to 1 in the am and 1 in the pm and she is wanting to stop the morning dosage because she sleeps through lunch.    History Giavonni has a past medical history of Arthritis; Depression; Diverticulitis; Blood in stool; Cancer ([place]); Chicken pox; Hay fever; GERD (gastroesophageal reflux disease); Heart disease; Heart murmur; Hyperlipidemia; and Colon polyps.   She has past surgical history that includes Knee surgery and Hernia repair.   Her family history is not on file.She reports that she has never smoked. She has never used smokeless tobacco. She reports that she does not drink alcohol or use illicit drugs.  Outpatient Prescriptions Prior to Visit  Medication Sig Dispense Refill  . acetaminophen (TYLENOL) 650 MG CR tablet Take 650 mg by mouth every 8 (eight) hours as needed for pain.    Marland Kitchen aspirin EC 81 MG tablet Take 81 mg by mouth daily.    . bisacodyl (DULCOLAX) 5 MG EC tablet Take 5 mg by mouth 2 (two) times daily as needed for moderate constipation.    Marland Kitchen buPROPion (WELLBUTRIN SR) 150 MG 12 hr tablet Take 1 tablet (150 mg total) by mouth daily. 30 tablet 2  . Calcium Carb-Cholecalciferol 600-800 MG-UNIT TABS Take 1 tablet by mouth 2 (two) times daily.    . clonazePAM (KLONOPIN) 0.5 MG tablet Take 0.5-1 mg by mouth See admin instructions. Reported on 01/19/2016    . cloNIDine (CATAPRES) 0.1 MG tablet TAKE ONE TABLET TWICE A DAY 60 tablet 2  . diltiazem (CARDIZEM) 30 MG tablet TAKE 1 TABLET 3 TIMES DAILY 270 tablet 2  . docusate sodium (COLACE) 100 MG capsule Take 100 mg by mouth  2 (two) times daily.    Marland Kitchen losartan-hydrochlorothiazide (HYZAAR) 50-12.5 MG tablet TAKE 1 TABLET EVERY DAY 90 tablet 3  . Multiple Vitamins-Minerals (CENTRUM SILVER PO) Take 1 tablet by mouth daily.    . nitroGLYCERIN (NITRODUR - DOSED IN MG/24 HR) 0.4 mg/hr patch APPLY ONE PATCH TO SKIN ONCE A DAY LEAVE ON 12 TO 14 HOURS THEN REMOVE FOR 10 TO 12 HOURS BEFORE APPLYING THE NEXT PATCH 30 patch 2  . NITROSTAT 0.4 MG SL tablet DISSOLVE ONE TABLET UNDER TONGUE IF NEEDED FOR CHEST PAIN 25 tablet 1  . Omega-3 Fatty Acids (FISH OIL) 1200 MG CAPS Take 1 capsule by mouth 2 (two) times daily.     Marland Kitchen omeprazole (PRILOSEC) 20 MG capsule Take 20 mg by mouth 2 (two) times daily before a meal.    . Polyethyl Glycol-Propyl Glycol 0.4-0.3 % SOLN Apply 1 drop to eye as needed (dry eyes).     . sertraline (ZOLOFT) 100 MG tablet Take 2 tablets by mouth at bedtime.     No facility-administered medications prior to visit.    ROS Review of Systems  Constitutional: Negative for fever, chills, diaphoresis and fatigue.  Eyes: Positive for visual disturbance.       Recent Eye Exam  Respiratory: Negative for chest tightness, shortness of breath and wheezing.   Cardiovascular: Negative for  chest pain, palpitations and leg swelling.  Gastrointestinal: Negative for nausea, vomiting and diarrhea.  Musculoskeletal: Positive for gait problem. Negative for myalgias, arthralgias and neck pain.   Objective:  BP 102/60 mmHg  Pulse 65  Temp(Src) 98 F (36.7 C) (Oral)  Resp 14  Ht 5\' 3"  (1.6 m)  Wt 167 lb 3.2 oz (75.841 kg)  BMI 29.63 kg/m2  SpO2 97%  Physical Exam  Constitutional: She is oriented to person, place, and time. She appears well-developed and well-nourished. No distress.  HENT:  Head: Normocephalic and atraumatic.  Right Ear: External ear normal.  Left Ear: External ear normal.  Cardiovascular: Normal rate and regular rhythm.   Pulmonary/Chest: Effort normal and breath sounds normal. No respiratory  distress. She has no wheezes. She has no rales. She exhibits no tenderness.  Neurological: She is alert and oriented to person, place, and time. No cranial nerve deficit. She exhibits normal muscle tone. Coordination normal.  Skin: Skin is warm and dry. No rash noted. She is not diaphoretic.  Psychiatric: She has a normal mood and affect. Her behavior is normal. Judgment and thought content normal.   Assessment & Plan:   Latarcha was seen today for follow-up.  Diagnoses and all orders for this visit:  Screening for osteoporosis  Osteopenia  Bipolar affective disorder in remission ([place])   I am having Ms. Gunnell maintain her clonazePAM, omeprazole, Polyethyl Glycol-Propyl Glycol, acetaminophen, Fish Oil, bisacodyl, Multiple Vitamins-Minerals (CENTRUM SILVER PO), Calcium Carb-Cholecalciferol, buPROPion, sertraline, docusate sodium, aspirin EC, diltiazem, losartan-hydrochlorothiazide, NITROSTAT, nitroGLYCERIN, and cloNIDine.  No orders of the defined types were placed in this encounter.     Follow-up: Return in about 6 months (around 07/21/2016) for Follow up (cancel may) .

## 2016-01-21 DIAGNOSIS — M8580 Other specified disorders of bone density and structure, unspecified site: Secondary | ICD-10-CM | POA: Insufficient documentation

## 2016-01-21 NOTE — Assessment & Plan Note (Signed)
Discussed with pt Osteopenia found

## 2016-01-21 NOTE — Assessment & Plan Note (Signed)
Stable with psychiatrist recently  Cutting back on Klonopin- congratulated her on this effort)

## 2016-01-21 NOTE — Assessment & Plan Note (Signed)
Results discussed with pt  Pt on vitamin D and Calcium daily

## 2016-03-15 ENCOUNTER — Encounter: Payer: Self-pay | Admitting: Family Medicine

## 2016-03-15 ENCOUNTER — Ambulatory Visit: Payer: Medicare Other | Admitting: Nurse Practitioner

## 2016-03-15 ENCOUNTER — Ambulatory Visit (INDEPENDENT_AMBULATORY_CARE_PROVIDER_SITE_OTHER): Payer: Medicare Other | Admitting: Family Medicine

## 2016-03-15 VITALS — BP 126/82 | HR 83 | Temp 98.0°F | Ht 63.0 in | Wt 160.0 lb

## 2016-03-15 DIAGNOSIS — I2510 Atherosclerotic heart disease of native coronary artery without angina pectoris: Secondary | ICD-10-CM

## 2016-03-15 DIAGNOSIS — R079 Chest pain, unspecified: Secondary | ICD-10-CM | POA: Diagnosis not present

## 2016-03-15 DIAGNOSIS — K429 Umbilical hernia without obstruction or gangrene: Secondary | ICD-10-CM

## 2016-03-15 DIAGNOSIS — I10 Essential (primary) hypertension: Secondary | ICD-10-CM | POA: Diagnosis not present

## 2016-03-15 DIAGNOSIS — E785 Hyperlipidemia, unspecified: Secondary | ICD-10-CM

## 2016-03-15 DIAGNOSIS — K439 Ventral hernia without obstruction or gangrene: Secondary | ICD-10-CM | POA: Insufficient documentation

## 2016-03-15 NOTE — Assessment & Plan Note (Signed)
At goal today. Continue current medications. 

## 2016-03-15 NOTE — Progress Notes (Signed)
Pre visit review using our clinic review tool, if applicable. No additional management support is needed unless otherwise documented below in the visit note. 

## 2016-03-15 NOTE — Assessment & Plan Note (Addendum)
Hernia is fully reducible today on exam. Nontender. No signs of obstruction. Good bowel movements. Given return precautions.

## 2016-03-15 NOTE — Assessment & Plan Note (Signed)
Very poorly controlled. Not currently on medications. Discussed Zetia and PCSK9 inhibitors including referral to cardiology though she refused these. I asked her to consider them. Given return precautions.

## 2016-03-15 NOTE — Progress Notes (Addendum)
Patient ID: ALAIJHA Fitzpatrick, female   DOB: [birth year], 80 y.o.   MRN: [id number]  Cindy Rumps, MD Phone: [phone]  Cindy Fitzpatrick is a 80 y.o. female who presents today for follow-up.  HYPERTENSION Disease Monitoring Home BP Monitoring not checking Chest pain- yes, see below    Dyspnea- yes, see below Medications Compliance-  taking clonidine, diltiazem, and Hyzaar. Lightheadedness-  yes, see below  Edema- no  HYPERLIPIDEMIA Symptoms Chest pain on exertion:  Yes, see below   Leg claudication:   No Medications: only takes a Fish oil. Has been intolerant of statins in the past. Unsure if she has tried Zetia.  Chest pain: Patient notes over the last several months she's had a couple of instances of central chest pressure that affects her back and neck and teeth. Notes some exertional shortness of breath as well. Note she takes a nitroglycerin and this resolves the chest pain. No diaphoresis. Some lightheadedness as well intermittently. Had an EKG with an evaluated in the emergency room for a fall in February that revealed right bundle branch block. She does not have any chest pain or shortness of breath at this time. Has not had any in the last 2 weeks. Of note the patient has hypertension and hyperlipidemia. She is only on a Fish oil for her cholesterol.  Umbilical hernia: Patient reports she's had this for a number of years and has had multiple repairs previously. She notes it always reduces. No pain in the area. No abdominal pain. Has bowel movements most days.   PMH: nonsmoker.   ROS see history of present illness  Objective  Physical Exam Filed Vitals:   03/15/16 1444  BP: 126/82  Pulse: 83  Temp: 98 F (36.7 C)    BP Readings from Last 3 Encounters:  03/15/16 126/82  01/19/16 102/60  12/18/15 118/62   Wt Readings from Last 3 Encounters:  03/15/16 160 lb (72.576 kg)  01/19/16 167 lb 3.2 oz (75.841 kg)  12/18/15 166 lb 12 oz (75.637 kg)    Physical Exam    Constitutional: She is well-developed, well-nourished, and in no distress.  HENT:  Head: Normocephalic and atraumatic.  Right Ear: External ear normal.  Left Ear: External ear normal.  Cardiovascular: Normal rate, regular rhythm and normal heart sounds.   Pulmonary/Chest: Effort normal and breath sounds normal.  Abdominal: Soft. Bowel sounds are normal. She exhibits no distension. There is no tenderness. There is no rebound and no guarding.  Umbilical hernia favoring the right side of her abdomen near the umbilicus, fully reducible, nontender, non-firm  Musculoskeletal: She exhibits no edema.  Neurological: She is alert.  Skin: Skin is warm and dry. She is not diaphoretic.    EKG: Sinus rhythm, rate 70, right bundle-branch block   Assessment/Plan: Please see individual problem list.  Benign essential HTN At goal today. Continue current medications.  CAD in native artery She reports intermittent chest pain over the last several months. Resolves with nitroglycerin. Associated with exertional shortness of breath as well. EKG in the office revealed. I advised cardiology evaluation though she refused this at this time. She will continue to use as needed nitroglycerin. She was advised if needing to use this more than 3 occurrences with no relief she should call 911. She will continue to monitor. She's given return precautions.  HLD (hyperlipidemia) Very poorly controlled. Not currently on medications. Discussed Zetia and PCSK9 inhibitors including referral to cardiology though she refused these. I asked her to consider them. Given  return precautions.  Umbilical hernia Hernia is fully reducible today on exam. Nontender. No signs of obstruction. Good bowel movements. Given return precautions.  Discussed obtaining lab work at length with the patient though she was unsure if she wanted to do this. She will call back to the office if she decides to proceed with lab work or referrals.  Orders  Placed This Encounter  Procedures  . EKG 12-Lead      Cindy Rumps, MD [place]

## 2016-03-15 NOTE — Patient Instructions (Signed)
Nice to meet you. Please continue your current blood pressure medication regimen. Please consider whether or not you would like to start on medication for cholesterol. We will have you return for fasting lab work to recheck this. I would suggest evaluation by cardiology for your chest pain. If you would like to do this please let us know. You can take the nitroglycerin as needed for chest pain. If you take this more than 3 times and your pain is still present please call 911. If you begin to have increasing episodes of chest pain please let us know. If you develop persistent chest pain, shortness of breath, palpitations, lightheadedness, falls, abdominal pain, hernia that does not reduce or any new or changing symptoms please seek medical attention.

## 2016-03-15 NOTE — Assessment & Plan Note (Signed)
She reports intermittent chest pain over the last several months. Resolves with nitroglycerin. Associated with exertional shortness of breath as well. EKG in the office revealed. I advised cardiology evaluation though she refused this at this time. She will continue to use as needed nitroglycerin. She was advised if needing to use this more than 3 occurrences with no relief she should call 911. She will continue to monitor. She's given return precautions.

## 2016-03-27 ENCOUNTER — Telehealth: Payer: Self-pay

## 2016-03-27 NOTE — Telephone Encounter (Signed)
Patient is on the list for [employer] 2017 and may be a good candidate for an AWV in 2017. Please let me know if/when appt is scheduled.   

## 2016-03-29 ENCOUNTER — Telehealth: Payer: Self-pay

## 2016-03-29 NOTE — Telephone Encounter (Signed)
Unsure if Caryl Bis would do this??

## 2016-03-29 NOTE — Telephone Encounter (Signed)
Thank you.  Will continue to follow as appropriate.

## 2016-03-29 NOTE — Telephone Encounter (Signed)
I spoke with the patient and explained that she would need appointment for a refill of this medication. I scheduled patient for Monday June 12th at 11:30AM.

## 2016-03-29 NOTE — Telephone Encounter (Signed)
Patient is requesting a refill on her zoloft.  This has been previously prescribed by Psych.  She is not wanting to return to psych to get refills.  Please advise as this patient has only seen Dr. Caryl Bis once and this was not a discussion during the visit. thanks

## 2016-03-30 ENCOUNTER — Other Ambulatory Visit: Payer: Self-pay | Admitting: Family Medicine

## 2016-04-05 ENCOUNTER — Encounter: Payer: Self-pay | Admitting: Family Medicine

## 2016-04-05 ENCOUNTER — Ambulatory Visit (INDEPENDENT_AMBULATORY_CARE_PROVIDER_SITE_OTHER): Payer: Medicare Other | Admitting: Family Medicine

## 2016-04-05 VITALS — BP 138/74 | HR 86 | Temp 98.2°F | Ht 63.0 in | Wt 157.4 lb

## 2016-04-05 DIAGNOSIS — R011 Cardiac murmur, unspecified: Secondary | ICD-10-CM

## 2016-04-05 DIAGNOSIS — G894 Chronic pain syndrome: Secondary | ICD-10-CM | POA: Diagnosis not present

## 2016-04-05 DIAGNOSIS — F3170 Bipolar disorder, currently in remission, most recent episode unspecified: Secondary | ICD-10-CM

## 2016-04-05 NOTE — Assessment & Plan Note (Signed)
Patient no longer wants to see her psychiatrist. Discussed that I could take over management of her Zoloft and Wellbutrin though if there is any change in the future would likely need to revisit the psychiatrist. She will call for refills on Zoloft and Wellbutrin.

## 2016-04-05 NOTE — Progress Notes (Signed)
Patient ID: Cindy Fitzpatrick, female   DOB: Dec 25, 1935, 80 y.o.   MRN: [id number]  Cindy Rumps, Cindy Fitzpatrick Phone: [phone]  Cindy Fitzpatrick is a 80 y.o. female who presents today for follow-up.  Anxiety/depression: Patient notes this is well controlled. She's previously followed by psychiatry though does not want to follow with them anymore. She has on Zoloft and Wellbutrin for many years. No longer on Klonopin. No SI. No depression. Minimal anxiety. Previously on Klonopin though not anymore.  Depression screen [facility] 2/9 04/05/2016 12/18/2015 11/27/2014  Decreased Interest 0 0 1  Down, Depressed, Hopeless 0 0 1  PHQ - 2 Score 0 0 2  Altered sleeping 2 - 0  Tired, decreased energy 0 - 1  Change in appetite 0 - 0  Feeling bad or failure about yourself  0 - 1  Trouble concentrating 0 - 1  Moving slowly or fidgety/restless 0 - 1  Suicidal thoughts 0 - 0  PHQ-9 Score 2 - 6  Difficult doing work/chores Not difficult at all - -   GAD 7 : Generalized Anxiety Score 04/05/2016  Nervous, Anxious, on Edge 1  Control/stop worrying 0  Worry too much - different things 0  Trouble relaxing 0  Restless 0  Easily annoyed or irritable 0  Afraid - awful might happen 0  Total GAD 7 Score 1  Anxiety Difficulty Not difficult at all    Toe pain: Patient notes history of arthritis in her toes. Notes this keeps her up at night sometimes. Has seen podiatry in the past for it and they told her she could not have surgery for this. Had tingling in the past though no longer. She had fallen sometime in the past and had x-rays of her toes at that time. She reports there were no broken bones. No recent falls. Takes Tylenol for this. Is able to get 7-8 hours sleep with this.  Heart murmur: Patient reports a history of heart murmur. Has never been told it is worse than previously. No lightheadedness. No recurrence of chest pain since our last visit when she declined cardiology referral. No palpitations. Notes it has been  present at least for the last 20 years since she had her stent placed.  PMH: nonsmoker.   ROS see history of present illness  Objective  Physical Exam Filed Vitals:   04/05/16 1126  BP: 138/74  Pulse: 86  Temp: 98.2 F (36.8 C)    Physical Exam  Constitutional: She is well-developed, well-nourished, and in no distress.  HENT:  Head: Normocephalic and atraumatic.  Right Ear: External ear normal.  Left Ear: External ear normal.  Cardiovascular: Normal rate and regular rhythm.   Murmur (2/6 systolic) heard. Pulmonary/Chest: Effort normal and breath sounds normal.  Musculoskeletal:  Bilateral toes nontender, nonswollen, bilateral feet with no tenderness or swelling, 2+ DP pulses, feet are warm and well-perfused  Neurological: She is alert. Gait normal.  Skin: Skin is warm and dry. She is not diaphoretic.  Psychiatric: Mood and affect normal.     Assessment/Plan: Please see individual problem list.  Bipolar affective disorder ([place]) Patient no longer wants to see her psychiatrist. Discussed that I could take over management of her Zoloft and Wellbutrin though if there is any change in the future would likely need to revisit the psychiatrist. She will call for refills on Zoloft and Wellbutrin.  Chronic pain syndrome Patient with chronic toe pain likely arthritic in nature. Previously evaluated by podiatry. Somewhat well managed with Tylenol. Discussed continuing  Tylenol. We'll continue to monitor.  Heart murmur Reports long history of a heart murmur. Asymptomatic at this time. We will continue to monitor. Given return precautions.   Cindy Rumps, Cindy Fitzpatrick [place]

## 2016-04-05 NOTE — Progress Notes (Signed)
Pre visit review using our clinic review tool, if applicable. No additional management support is needed unless otherwise documented below in the visit note. 

## 2016-04-05 NOTE — Assessment & Plan Note (Signed)
Reports long history of a heart murmur. Asymptomatic at this time. We will continue to monitor. Given return precautions.

## 2016-04-05 NOTE — Patient Instructions (Signed)
Nice to see you. When you need a refill on her Zoloft and Wellbutrin please have the pharmacy call our office requesting this. Please continue Tylenol for your toe discomfort. If you develop lightheadedness, chest pain, shortness of breath, palpitations, thoughts of harming herself or others, worsening depression or anxiety, or any new or changing symptoms please seek medical attention.

## 2016-04-05 NOTE — Assessment & Plan Note (Signed)
Patient with chronic toe pain likely arthritic in nature. Previously evaluated by podiatry. Somewhat well managed with Tylenol. Discussed continuing Tylenol. We'll continue to monitor.

## 2016-04-19 ENCOUNTER — Other Ambulatory Visit: Payer: Self-pay | Admitting: Family Medicine

## 2016-04-19 ENCOUNTER — Ambulatory Visit: Payer: Medicare Other | Admitting: Family Medicine

## 2016-04-22 ENCOUNTER — Emergency Department: Payer: Medicare Other

## 2016-04-22 ENCOUNTER — Inpatient Hospital Stay
Admission: EM | Admit: 2016-04-22 | Discharge: 2016-04-26 | DRG: 378 | Disposition: A | Discharge status: Skilled Nursing Facility | Payer: Medicare Other | Attending: Internal Medicine | Admitting: Internal Medicine

## 2016-04-22 DIAGNOSIS — Z823 Family history of stroke: Secondary | ICD-10-CM

## 2016-04-22 DIAGNOSIS — K5713 Diverticulitis of small intestine without perforation or abscess with bleeding: Secondary | ICD-10-CM

## 2016-04-22 DIAGNOSIS — K5733 Diverticulitis of large intestine without perforation or abscess with bleeding: Secondary | ICD-10-CM | POA: Diagnosis present

## 2016-04-22 DIAGNOSIS — F319 Bipolar disorder, unspecified: Secondary | ICD-10-CM | POA: Diagnosis present

## 2016-04-22 DIAGNOSIS — E876 Hypokalemia: Secondary | ICD-10-CM | POA: Diagnosis present

## 2016-04-22 DIAGNOSIS — Z86010 Personal history of colonic polyps: Secondary | ICD-10-CM | POA: Diagnosis not present

## 2016-04-22 DIAGNOSIS — K529 Noninfective gastroenteritis and colitis, unspecified: Secondary | ICD-10-CM | POA: Diagnosis not present

## 2016-04-22 DIAGNOSIS — Z6828 Body mass index (BMI) 28.0-28.9, adult: Secondary | ICD-10-CM | POA: Diagnosis not present

## 2016-04-22 DIAGNOSIS — I25111 Atherosclerotic heart disease of native coronary artery with angina pectoris with documented spasm: Secondary | ICD-10-CM | POA: Insufficient documentation

## 2016-04-22 DIAGNOSIS — D500 Iron deficiency anemia secondary to blood loss (chronic): Secondary | ICD-10-CM | POA: Diagnosis present

## 2016-04-22 DIAGNOSIS — Z85828 Personal history of other malignant neoplasm of skin: Secondary | ICD-10-CM

## 2016-04-22 DIAGNOSIS — Z955 Presence of coronary angioplasty implant and graft: Secondary | ICD-10-CM | POA: Insufficient documentation

## 2016-04-22 DIAGNOSIS — I10 Essential (primary) hypertension: Secondary | ICD-10-CM | POA: Diagnosis present

## 2016-04-22 DIAGNOSIS — D62 Acute posthemorrhagic anemia: Secondary | ICD-10-CM | POA: Diagnosis present

## 2016-04-22 DIAGNOSIS — K5792 Diverticulitis of intestine, part unspecified, without perforation or abscess without bleeding: Secondary | ICD-10-CM | POA: Diagnosis not present

## 2016-04-22 DIAGNOSIS — Z8249 Family history of ischemic heart disease and other diseases of the circulatory system: Secondary | ICD-10-CM | POA: Diagnosis not present

## 2016-04-22 DIAGNOSIS — K439 Ventral hernia without obstruction or gangrene: Secondary | ICD-10-CM | POA: Diagnosis present

## 2016-04-22 DIAGNOSIS — R634 Abnormal weight loss: Secondary | ICD-10-CM | POA: Insufficient documentation

## 2016-04-22 DIAGNOSIS — E785 Hyperlipidemia, unspecified: Secondary | ICD-10-CM | POA: Diagnosis present

## 2016-04-22 DIAGNOSIS — K219 Gastro-esophageal reflux disease without esophagitis: Secondary | ICD-10-CM | POA: Diagnosis present

## 2016-04-22 DIAGNOSIS — I700 Atherosclerosis of aorta: Secondary | ICD-10-CM | POA: Diagnosis present

## 2016-04-22 DIAGNOSIS — I2510 Atherosclerotic heart disease of native coronary artery without angina pectoris: Secondary | ICD-10-CM | POA: Diagnosis not present

## 2016-04-22 DIAGNOSIS — I209 Angina pectoris, unspecified: Secondary | ICD-10-CM | POA: Insufficient documentation

## 2016-04-22 LAB — CBC WITH DIFFERENTIAL/PLATELET
BASOS PCT: 1 %
Basophils Absolute: 0 10*3/uL (ref 0–0.1)
Eosinophils Absolute: 0.1 10*3/uL (ref 0–0.7)
Eosinophils Relative: 1 %
HEMATOCRIT: 24.5 % — AB (ref 35.0–47.0)
HEMOGLOBIN: 8.1 g/dL — AB (ref 12.0–16.0)
LYMPHS ABS: 1.4 10*3/uL (ref 1.0–3.6)
Lymphocytes Relative: 20 %
MCH: 25 pg — ABNORMAL LOW (ref 26.0–34.0)
MCHC: 33.2 g/dL (ref 32.0–36.0)
MCV: 75.5 fL — ABNORMAL LOW (ref 80.0–100.0)
MONOS PCT: 18 %
Monocytes Absolute: 1.3 10*3/uL — ABNORMAL HIGH (ref 0.2–0.9)
NEUTROS ABS: 4.4 10*3/uL (ref 1.4–6.5)
NEUTROS PCT: 60 %
Platelets: 477 10*3/uL — ABNORMAL HIGH (ref 150–440)
RBC: 3.24 MIL/uL — ABNORMAL LOW (ref 3.80–5.20)
RDW: 17.8 % — ABNORMAL HIGH (ref 11.5–14.5)
WBC: 7.3 10*3/uL (ref 3.6–11.0)

## 2016-04-22 LAB — COMPREHENSIVE METABOLIC PANEL
ALBUMIN: 3 g/dL — AB (ref 3.5–5.0)
ALK PHOS: 77 U/L (ref 38–126)
ALT: 18 U/L (ref 14–54)
ANION GAP: 12 (ref 5–15)
AST: 24 U/L (ref 15–41)
BILIRUBIN TOTAL: 0.3 mg/dL (ref 0.3–1.2)
BUN: 12 mg/dL (ref 6–20)
CALCIUM: 8.6 mg/dL — AB (ref 8.9–10.3)
CO2: 21 mmol/L — ABNORMAL LOW (ref 22–32)
CREATININE: 0.77 mg/dL (ref 0.44–1.00)
Chloride: 101 mmol/L (ref 101–111)
GFR calc Af Amer: >60 mL/min (ref >60)
GFR calc non Af Amer: >60 mL/min (ref >60)
GLUCOSE: 99 mg/dL (ref 65–99)
Potassium: 3.3 mmol/L — ABNORMAL LOW (ref 3.5–5.1)
Sodium: 134 mmol/L — ABNORMAL LOW (ref 135–145)
TOTAL PROTEIN: 5.8 g/dL — AB (ref 6.5–8.1)

## 2016-04-22 LAB — URINALYSIS COMPLETE WITH MICROSCOPIC (ARMC ONLY)
Bilirubin Urine: NEGATIVE
GLUCOSE, UA: NEGATIVE mg/dL
Hgb urine dipstick: NEGATIVE
Nitrite: NEGATIVE
Protein, ur: NEGATIVE mg/dL
Specific Gravity, Urine: 1.011 (ref 1.005–1.030)
pH: 7 (ref 5.0–8.0)

## 2016-04-22 LAB — TROPONIN I: Troponin I: 0.03 ng/mL (ref <0.03)

## 2016-04-22 LAB — LIPASE, BLOOD: Lipase: 21 U/L (ref 11–51)

## 2016-04-22 LAB — MRSA PCR SCREENING: MRSA BY PCR: NEGATIVE

## 2016-04-22 LAB — MAGNESIUM: MAGNESIUM: 1.9 mg/dL (ref 1.7–2.4)

## 2016-04-22 MED ORDER — DILTIAZEM HCL 30 MG PO TABS
30.0000 mg | ORAL_TABLET | Freq: Three times a day (TID) | ORAL | Status: DC
  Administered 2016-04-22 – 2016-04-26 (×9): 30 mg via ORAL
  Filled 2016-04-22 (×4): qty 1
  Filled 2016-04-22: qty 2
  Filled 2016-04-22 (×5): qty 1

## 2016-04-22 MED ORDER — ONDANSETRON HCL 4 MG/2ML IJ SOLN
4.0000 mg | Freq: Once | INTRAMUSCULAR | Status: AC
  Administered 2016-04-22: 4 mg via INTRAVENOUS
  Filled 2016-04-22: qty 2

## 2016-04-22 MED ORDER — IOPAMIDOL (ISOVUE-300) INJECTION 61%
100.0000 mL | Freq: Once | INTRAVENOUS | Status: AC | PRN
  Administered 2016-04-22: 100 mL via INTRAVENOUS

## 2016-04-22 MED ORDER — LOSARTAN POTASSIUM 50 MG PO TABS
50.0000 mg | ORAL_TABLET | Freq: Every day | ORAL | Status: DC
  Administered 2016-04-23 – 2016-04-26 (×3): 50 mg via ORAL
  Filled 2016-04-22 (×4): qty 1

## 2016-04-22 MED ORDER — CIPROFLOXACIN IN D5W 400 MG/200ML IV SOLN
400.0000 mg | Freq: Once | INTRAVENOUS | Status: AC
  Administered 2016-04-22: 400 mg via INTRAVENOUS
  Filled 2016-04-22: qty 200

## 2016-04-22 MED ORDER — BUPROPION HCL ER (SR) 150 MG PO TB12
150.0000 mg | ORAL_TABLET | Freq: Every day | ORAL | Status: DC
  Administered 2016-04-23 – 2016-04-26 (×4): 150 mg via ORAL
  Filled 2016-04-22 (×5): qty 1

## 2016-04-22 MED ORDER — PANTOPRAZOLE SODIUM 40 MG PO TBEC
40.0000 mg | DELAYED_RELEASE_TABLET | Freq: Two times a day (BID) | ORAL | Status: DC
  Administered 2016-04-23 – 2016-04-26 (×7): 40 mg via ORAL
  Filled 2016-04-22 (×7): qty 1

## 2016-04-22 MED ORDER — POLYVINYL ALCOHOL 1.4 % OP SOLN
1.0000 [drp] | OPHTHALMIC | Status: DC | PRN
  Filled 2016-04-22: qty 15

## 2016-04-22 MED ORDER — METRONIDAZOLE IN NACL 5-0.79 MG/ML-% IV SOLN
500.0000 mg | Freq: Four times a day (QID) | INTRAVENOUS | Status: DC
Start: 2016-04-22 — End: 2016-04-26
  Administered 2016-04-22 – 2016-04-26 (×15): 500 mg via INTRAVENOUS
  Filled 2016-04-22 (×18): qty 100

## 2016-04-22 MED ORDER — METRONIDAZOLE IN NACL 5-0.79 MG/ML-% IV SOLN
500.0000 mg | Freq: Once | INTRAVENOUS | Status: AC
  Administered 2016-04-22: 500 mg via INTRAVENOUS
  Filled 2016-04-22: qty 100

## 2016-04-22 MED ORDER — SODIUM CHLORIDE 0.9 % IV BOLUS (SEPSIS)
1000.0000 mL | Freq: Once | INTRAVENOUS | Status: AC
  Administered 2016-04-22: 1000 mL via INTRAVENOUS

## 2016-04-22 MED ORDER — ALBUTEROL SULFATE (2.5 MG/3ML) 0.083% IN NEBU: 2.5000 mg | INHALATION_SOLUTION | RESPIRATORY_TRACT | Status: DC | PRN

## 2016-04-22 MED ORDER — LOSARTAN POTASSIUM-HCTZ 50-12.5 MG PO TABS: 1.0000 | ORAL_TABLET | Freq: Every day | ORAL | Status: DC

## 2016-04-22 MED ORDER — SODIUM CHLORIDE 0.9 % IV SOLN
INTRAVENOUS | Status: AC
  Administered 2016-04-22: 1000 mL via INTRAVENOUS
  Administered 2016-04-23: 10:00:00 via INTRAVENOUS

## 2016-04-22 MED ORDER — SERTRALINE HCL 100 MG PO TABS
200.0000 mg | ORAL_TABLET | Freq: Every day | ORAL | Status: DC
  Administered 2016-04-22 – 2016-04-25 (×4): 200 mg via ORAL
  Filled 2016-04-22 (×4): qty 2

## 2016-04-22 MED ORDER — POTASSIUM CHLORIDE CRYS ER 20 MEQ PO TBCR
40.0000 meq | EXTENDED_RELEASE_TABLET | Freq: Once | ORAL | Status: AC
  Administered 2016-04-24: 40 meq via ORAL
  Filled 2016-04-22: qty 2

## 2016-04-22 MED ORDER — CLONIDINE HCL 0.1 MG PO TABS
0.1000 mg | ORAL_TABLET | Freq: Two times a day (BID) | ORAL | Status: DC
  Administered 2016-04-22 – 2016-04-26 (×7): 0.1 mg via ORAL
  Filled 2016-04-22 (×8): qty 1

## 2016-04-22 MED ORDER — ONDANSETRON HCL 4 MG PO TABS
4.0000 mg | ORAL_TABLET | Freq: Four times a day (QID) | ORAL | Status: DC | PRN
Start: 2016-04-22 — End: 2016-04-26

## 2016-04-22 MED ORDER — FENTANYL CITRATE (PF) 100 MCG/2ML IJ SOLN
25.0000 ug | Freq: Once | INTRAMUSCULAR | Status: AC
  Administered 2016-04-22: 25 ug via INTRAVENOUS
  Filled 2016-04-22: qty 2

## 2016-04-22 MED ORDER — PANTOPRAZOLE SODIUM 40 MG PO TBEC
40.0000 mg | DELAYED_RELEASE_TABLET | Freq: Once | ORAL | Status: AC
  Administered 2016-04-22: 40 mg via ORAL
  Filled 2016-04-22: qty 1

## 2016-04-22 MED ORDER — NITROGLYCERIN 0.4 MG/HR TD PT24
0.4000 mg | MEDICATED_PATCH | Freq: Every day | TRANSDERMAL | Status: DC
  Administered 2016-04-23 – 2016-04-26 (×3): 0.4 mg via TRANSDERMAL
  Filled 2016-04-22 (×5): qty 1

## 2016-04-22 MED ORDER — HYDROCHLOROTHIAZIDE 12.5 MG PO CAPS
12.5000 mg | ORAL_CAPSULE | Freq: Every day | ORAL | Status: DC
  Administered 2016-04-23 – 2016-04-24 (×2): 12.5 mg via ORAL
  Filled 2016-04-22 (×2): qty 1

## 2016-04-22 MED ORDER — ONDANSETRON HCL 4 MG/2ML IJ SOLN
4.0000 mg | Freq: Four times a day (QID) | INTRAMUSCULAR | Status: DC | PRN
  Administered 2016-04-25: 4 mg via INTRAVENOUS
  Filled 2016-04-22: qty 2

## 2016-04-22 MED ORDER — DIATRIZOATE MEGLUMINE & SODIUM 66-10 % PO SOLN
15.0000 mL | Freq: Once | ORAL | Status: AC
  Administered 2016-04-22: 15 mL via ORAL

## 2016-04-22 MED ORDER — CIPROFLOXACIN IN D5W 400 MG/200ML IV SOLN
400.0000 mg | Freq: Two times a day (BID) | INTRAVENOUS | Status: DC
  Administered 2016-04-23 – 2016-04-26 (×6): 400 mg via INTRAVENOUS
  Filled 2016-04-22 (×8): qty 200

## 2016-04-22 NOTE — ED Notes (Signed)
Informed RN bed ready  [phone]

## 2016-04-22 NOTE — ED Notes (Signed)
Pt from [facility] via EMS, per staff pt has had multiple complaints for 2 weeks including anxiety and diarrhea. Pt reports rectal bleeding today. Also C/O abd hernia. Pt had no meds since Monday

## 2016-04-22 NOTE — ED Provider Notes (Signed)
CSN: [id number]     Arrival date & time 04/22/16  1236 History   First MD Initiated Contact with Patient 04/22/16 1247     Chief Complaint  Patient presents with  . Rectal Bleeding     (Consider location/radiation/quality/duration/timing/severity/associated sxs/prior Treatment) The history is provided by the patient.  Cindy Fitzpatrick is a 80 y.o. female hx of diverticulitis, skin cancer, colon polyp, Here presenting with anxiety, diarrhea, abdominal pain, nausea. Symptoms going on for the last 2 weeks or so. States that she has several episodes a day of watery diarrhea with some black stools and brown stools. Also has been nauseated and has poor appetite but was able to keep things down. Has several episodes of vomiting intermittently when she eats. She also has worsening pain and swelling around her abdominal hernia. Has been more anxious recently about her symptoms. Intermittent chest pain for the last several weeks as well. She resides at [facility] assisted living.    Past Medical History  Diagnosis Date  . Arthritis   . Depression   . Diverticulitis   . Blood in stool   . Cancer ([place])     skin  . Chicken pox     SHINGLES TWICE  . Hay fever   . GERD (gastroesophageal reflux disease)   . Heart disease   . Heart murmur   . Hyperlipidemia   . Colon polyps    Past Surgical History  Procedure Laterality Date  . Knee surgery    . Hernia repair     No family history on file. Social History  Substance Use Topics  . Smoking status: Never Smoker   . Smokeless tobacco: Never Used  . Alcohol Use: No   OB History    No data available     Review of Systems  Cardiovascular: Positive for chest pain.  Gastrointestinal: Positive for nausea, abdominal pain and blood in stool.  All other systems reviewed and are negative.     Allergies  Codeine; Hydrocodone-acetaminophen; Meperidine; Niacin er; Other; Propoxyphene; Solifenacin; Sulfa antibiotics; Fish-derived products;  Nabumetone; and Oxycodone-acetaminophen  Home Medications   Prior to Admission medications   Medication Sig Start Date End Date Taking? Authorizing Provider  acetaminophen (TYLENOL) 650 MG CR tablet Take 650 mg by mouth every 8 (eight) hours as needed for pain.    Historical Provider, MD  aspirin EC 81 MG tablet Take 81 mg by mouth daily.    Historical Provider, MD  bisacodyl (DULCOLAX) 5 MG EC tablet Take 5 mg by mouth 2 (two) times daily as needed for moderate constipation.    Historical Provider, MD  buPROPion (WELLBUTRIN SR) 150 MG 12 hr tablet Take 1 tablet (150 mg total) by mouth daily. 05/01/15   Rubbie Battiest, NP  Calcium Carb-Cholecalciferol 600-800 MG-UNIT TABS Take 1 tablet by mouth daily.     Historical Provider, MD  Calcium Carbonate Antacid (TUMS PO) Take by mouth.    Historical Provider, MD  cloNIDine (CATAPRES) 0.1 MG tablet TAKE ONE TABLET TWICE A DAY 04/19/16   [name], MD  diltiazem (CARDIZEM) 30 MG tablet TAKE 1 TABLET 3 TIMES DAILY 09/23/15   Rubbie Battiest, NP  losartan-hydrochlorothiazide [facility] 50-12.5 MG tablet TAKE 1 TABLET EVERY DAY 10/10/15   Rubbie Battiest, NP  Multiple Vitamins-Minerals (CENTRUM SILVER PO) Take 1 tablet by mouth daily.    Historical Provider, MD  nitroGLYCERIN (NITRODUR - DOSED IN MG/24 HR) 0.4 mg/hr patch APPLY ONE PATCH TO SKIN ONCE A DAY  LEAVE ON 12 TO 14 HOURS THEN REMOVE FOR 10 TO 12 HOURS BEFORE APPLYING THE NEXT [facility] 12/18/15   Rubbie Battiest, NP  NITROSTAT 0.4 MG SL tablet DISSOLVE ONE TABLET UNDER TONGUE IF NEEDED FOR CHEST PAIN 10/24/15   Jackolyn Confer, MD  Omega-3 Fatty Acids (FISH OIL) 1200 MG CAPS Take 1 capsule by mouth daily.     Historical Provider, MD  omeprazole (PRILOSEC) 20 MG capsule Take 20 mg by mouth 2 (two) times daily before a meal.    Historical Provider, MD  Polyethyl Glycol-Propyl Glycol 0.4-0.3 % SOLN Apply 1 drop to eye as needed (dry eyes).     Historical Provider, MD  sertraline (ZOLOFT) 100 MG tablet Take  2 tablets by mouth at bedtime. 05/14/15   Historical Provider, MD   BP 134/81 mmHg  Pulse 86  Temp(Src) 98 F (36.7 C) (Oral)  Resp 17  SpO2 100% Physical Exam  Constitutional: She is oriented to person, place, and time.  Anxious   HENT:  Head: Normocephalic.  MM slightly dry   Eyes: Conjunctivae are normal. Pupils are equal, round, and reactive to light.  Neck: Normal range of motion. Neck supple.  Cardiovascular: Normal rate, regular rhythm and normal heart sounds.   Pulmonary/Chest: Effort normal and breath sounds normal. No respiratory distress. She has no wheezes. She has no rales.  Abdominal: Soft. Bowel sounds are normal.  Incisional hernia painful to palpation but reducible   Genitourinary:  Rectal- some external hemorrhoids that is not thrombosed. Some bloody stool, guiac positive   Musculoskeletal: Normal range of motion. She exhibits no edema or tenderness.  Neurological: She is alert and oriented to person, place, and time. No cranial nerve deficit. Coordination normal.  Skin: Skin is warm and dry.  Psychiatric: She has a normal mood and affect. Her behavior is normal. Thought content normal.  Nursing note and vitals reviewed.   ED Course  Procedures (including critical care time) Labs Review Labs Reviewed  CBC WITH DIFFERENTIAL/PLATELET - Abnormal; Notable for the following:    RBC 3.24 (*)    Hemoglobin 8.1 (*)    HCT 24.5 (*)    MCV 75.5 (*)    MCH 25.0 (*)    RDW 17.8 (*)    Platelets 477 (*)    Monocytes Absolute 1.3 (*)    All other components within normal limits  COMPREHENSIVE METABOLIC PANEL - Abnormal; Notable for the following:    Sodium 134 (*)    Potassium 3.3 (*)    CO2 21 (*)    Calcium 8.6 (*)    Total Protein 5.8 (*)    Albumin 3.0 (*)    All other components within normal limits  TROPONIN I - Abnormal; Notable for the following:    Troponin I 0.03 (*)    All other components within normal limits  URINALYSIS COMPLETEWITH MICROSCOPIC  ([facility] ONLY) - Abnormal; Notable for the following:    Color, Urine YELLOW (*)    APPearance HAZY (*)    Ketones, ur 1+ (*)    Leukocytes, UA TRACE (*)    Bacteria, UA RARE (*)    Squamous Epithelial / LPF 0-5 (*)    All other components within normal limits  LIPASE, BLOOD    Imaging Review Dg Chest 2 View  04/22/2016  CLINICAL DATA:  Shortness of breath and chest pain EXAM: CHEST  2 VIEW COMPARISON:  06/11/2015 FINDINGS: Cardiac shadow is mildly enlarged. Aortic calcifications are again noted. Elevation of the  right hemidiaphragm is again seen. The lungs are clear bilaterally. IMPRESSION: No active cardiopulmonary disease. Electronically Signed   By: Inez Catalina M.D.   On: 04/22/2016 13:15   Ct Abdomen Pelvis W Contrast  04/22/2016  CLINICAL DATA:  Multiple complaints. Rectal bleeding. Abdominal hernia. EXAM: CT ABDOMEN AND PELVIS WITH CONTRAST TECHNIQUE: Multidetector CT imaging of the abdomen and pelvis was performed using the standard protocol following bolus administration of intravenous contrast. CONTRAST:  173mL ISOVUE-300 IOPAMIDOL (ISOVUE-300) INJECTION 61% COMPARISON:  08/08/2013 FINDINGS: Lower chest:  Lung bases are clear.  Mild cardiomegaly. Hepatobiliary: Normal liver. No liver mass. Prior cholecystectomy. Intrahepatic biliary ductal dilatation and mild dilatation of the common bile duct measuring up to 10 mm likely reflecting a post cholecystectomy state. Pancreas: Normal. Spleen: Normal. Adrenals/Urinary Tract: Normal adrenal glands. 4.2 x 3.9 cm hypodense, fluid attenuating exophytic left renal mass most consistent with a cyst. Right upper pole renal scarring. Mildly distended bladder without focal abnormality. Stomach/Bowel: Sigmoid diverticulosis. Severe rectosigmoid bowel wall thickening and surrounding inflammatory changes most concerning for diverticulitis. No bowel dilatation to suggest obstruction. Right paraumbilical abdominal wall hernia containing multiple loops of  nondilated small bowel. Vascular/Lymphatic: Abdominal aortic atherosclerosis. No lymphadenopathy. Reproductive: Prior hysterectomy.  No adnexal mass. Other: No fluid collection or hematoma. Musculoskeletal: Degenerative disc disease at L4-5 and L5-S1 with grade 1 anterolisthesis of L4 on L5 and L5 on S1 secondary to bilateral severe facet arthropathy. S-shaped scoliosis of the thoracolumbar spine. Facet arthropathy throughout the visualized thoracolumbar spine. IMPRESSION: 1. Sigmoid diverticulosis. Severe rectosigmoid bowel wall thickening and surrounding inflammatory changes most concerning for diverticulitis. 2. Right paraumbilical abdominal wall hernia containing multiple loops of nondilated small bowel. 3. Aortic atherosclerosis. Electronically Signed   By: Kathreen Devoid   On: 04/22/2016 15:22   I have personally reviewed and evaluated these images and lab results as part of my medical decision-making.   EKG Interpretation None     ED ECG REPORT I, YAO, DAVID, the attending physician, personally viewed and interpreted this ECG.   Date: 04/22/2016  EKG Time: 12:43 pm  Rate: 87  Rhythm: normal EKG, normal sinus rhythm  Axis: normal  Intervals:none  ST&T Change: nonspecific   MDM   Final diagnoses:  None   Cindy Fitzpatrick is a 80 y.o. female here with melena, bloody stools, abdominal pain, chest pain. Consider SBO, incarcerated hernia, GI bleed. Will get labs, CT ab/pel. Will hydrate and reassess.   3:59 PM WBC nl. Hg 8.1, was 10 several months ago. CT showed diverticulosis and diverticulitis. Afebrile. Given cipro/flagyl. Since she dropped Hg 2 points, will admit for observation. Consulted Dr. Allen Norris. Hospitalist to admit.     Wandra Arthurs, MD 04/22/16 1600

## 2016-04-22 NOTE — ED Notes (Signed)
Patient transported to X-ray 

## 2016-04-22 NOTE — H&P (Signed)
[facility] [place] NAME: Cindy Fitzpatrick    MR#:  [id number]  DATE OF BIRTH:  10-11-1936  DATE OF ADMISSION:  04/22/2016  PRIMARY CARE PHYSICIAN: Tommi Rumps, MD   REQUESTING/REFERRING PHYSICIAN: Wandra Arthurs, MD  CHIEF COMPLAINT:   Chief Complaint  Patient presents with  . Rectal Bleeding   Rectal bleeding for one week HISTORY OF PRESENT ILLNESS:  Cindy Fitzpatrick  is a 80 y.o. female with a known history of diverticulitis, colon polyp, skin cancer and GERD.  The patient presented to the ED with rectal bleeding, abdominal pain, vomitting and diarrhea.  She had worseing abdominal pain around the abdominal hernia area. CT of the abdomen showed sigmoid diverticulitis.on-call GI physicians suggested antibiotic treatment.  PAST MEDICAL HISTORY:   Past Medical History  Diagnosis Date  . Arthritis   . Depression   . Diverticulitis   . Blood in stool   . Cancer ([place])     skin  . Chicken pox     SHINGLES TWICE  . Hay fever   . GERD (gastroesophageal reflux disease)   . Heart disease   . Heart murmur   . Hyperlipidemia   . Colon polyps     PAST SURGICAL HISTORY:   Past Surgical History  Procedure Laterality Date  . Knee surgery    . Hernia repair      SOCIAL HISTORY:   Social History  Substance Use Topics  . Smoking status: Never Smoker   . Smokeless tobacco: Never Used  . Alcohol Use: No    FAMILY HISTORY:   Family History  Problem Relation Age of Onset  . Stroke Mother   . Heart attack Father     DRUG ALLERGIES:   Allergies  Allergen Reactions  . Codeine     Other reaction(s): Headache  . Hydrocodone-Acetaminophen Nausea And Vomiting  . Meperidine     Other reaction(s): Dizziness  . Niacin Er Other (See Comments)    Patient doesn't remember  . Other Nausea Only  . Propoxyphene     Other reaction(s): Dizziness  . Solifenacin Other (See Comments)  . Sulfa Antibiotics Other (See Comments)   Patient doesn' t remember  . Fish-Derived Products Rash    Weakness  . Nabumetone Rash  . Oxycodone-Acetaminophen Rash    Couldn't think or remember anything    REVIEW OF SYSTEMS:  CONSTITUTIONAL: No fever, fatigue or weakness.  EYES: No blurred or double vision.  EARS, NOSE, AND THROAT: No tinnitus or ear pain.  RESPIRATORY: No cough, shortness of breath, wheezing or hemoptysis.  CARDIOVASCULAR: No chest pain, orthopnea, edema.  GASTROINTESTINAL: has nausea, vomiting, diarrhea or abdominal pain. Has bloody stool. GENITOURINARY: No dysuria, hematuria.  ENDOCRINE: No polyuria, nocturia,  HEMATOLOGY: No anemia, easy bruising or bleeding SKIN: No rash or lesion. MUSCULOSKELETAL: No joint pain or arthritis.   NEUROLOGIC: No tingling, numbness, weakness.  PSYCHIATRY: No anxiety or depression.   MEDICATIONS AT HOME:   Prior to Admission medications   Medication Sig Start Date End Date Taking? Authorizing Provider  acetaminophen (TYLENOL) 650 MG CR tablet Take 650 mg by mouth every 8 (eight) hours as needed for pain.    Historical Provider, MD  aspirin EC 81 MG tablet Take 81 mg by mouth daily.    Historical Provider, MD  bisacodyl (DULCOLAX) 5 MG EC tablet Take 5 mg by mouth 2 (two) times daily as needed for moderate constipation.    Historical Provider, MD  buPROPion (WELLBUTRIN SR) 150 MG 12 hr tablet Take 1 tablet (150 mg total) by mouth daily. 05/01/15   Rubbie Battiest, NP  Calcium Carb-Cholecalciferol 600-800 MG-UNIT TABS Take 1 tablet by mouth daily.     Historical Provider, MD  Calcium Carbonate Antacid (TUMS PO) Take by mouth.    Historical Provider, MD  cloNIDine (CATAPRES) 0.1 MG tablet TAKE ONE TABLET TWICE A DAY 04/19/16   [name], MD  diltiazem (CARDIZEM) 30 MG tablet TAKE 1 TABLET 3 TIMES DAILY 09/23/15   Rubbie Battiest, NP  losartan-hydrochlorothiazide [facility]) 50-12.5 MG tablet TAKE 1 TABLET EVERY DAY 10/10/15   Rubbie Battiest, NP  Multiple Vitamins-Minerals (CENTRUM  SILVER PO) Take 1 tablet by mouth daily.    Historical Provider, MD  nitroGLYCERIN (NITRODUR - DOSED IN MG/24 HR) 0.4 mg/hr patch APPLY ONE PATCH TO SKIN ONCE A DAY LEAVE ON 12 TO 14 HOURS THEN REMOVE FOR 10 TO 12 HOURS BEFORE APPLYING THE NEXT [facility] 12/18/15   Rubbie Battiest, NP  NITROSTAT 0.4 MG SL tablet DISSOLVE ONE TABLET UNDER TONGUE IF NEEDED FOR CHEST PAIN 10/24/15   Jackolyn Confer, MD  Omega-3 Fatty Acids (FISH OIL) 1200 MG CAPS Take 1 capsule by mouth daily.     Historical Provider, MD  omeprazole (PRILOSEC) 20 MG capsule Take 20 mg by mouth 2 (two) times daily before a meal.    Historical Provider, MD  Polyethyl Glycol-Propyl Glycol 0.4-0.3 % SOLN Apply 1 drop to eye as needed (dry eyes).     Historical Provider, MD  sertraline (ZOLOFT) 100 MG tablet Take 2 tablets by mouth at bedtime. 05/14/15   Historical Provider, MD      VITAL SIGNS:  Blood pressure 134/81, pulse 86, temperature 98 F (36.7 C), temperature source Oral, resp. rate 17, SpO2 100 %.  PHYSICAL EXAMINATION:  GENERAL:  80 y.o.-year-old patient lying in the bed with no acute distress.  EYES: Pupils equal, round, reactive to light and accommodation. No scleral icterus. Extraocular muscles intact.  HEENT: Head atraumatic, normocephalic. Oropharynx and nasopharynx clear.  NECK:  Supple, no jugular venous distention. No thyroid enlargement, no tenderness.  LUNGS: Normal breath sounds bilaterally, no wheezing, rales,rhonchi or crepitation. No use of accessory muscles of respiration.  CARDIOVASCULAR: S1, S2 normal. Systolic murmurs 4/6, no rubs, or gallops.  ABDOMEN: Soft,  Abdominal hernia, soft, tenderness around hernia, nondistended. Bowel sounds present. No organomegaly or mass.  EXTREMITIES: No pedal edema, cyanosis, or clubbing.  NEUROLOGIC: Cranial nerves II through XII are intact. Muscle strength 5/5 in all extremities. Sensation intact. Gait not checked.  PSYCHIATRIC: The patient is alert and oriented x 3.  SKIN:  No obvious rash, lesion, or ulcer.   LABORATORY PANEL:   CBC  Recent Labs Lab 04/22/16 1250  WBC 7.3  HGB 8.1*  HCT 24.5*  PLT 477*   ------------------------------------------------------------------------------------------------------------------  Chemistries   Recent Labs Lab 04/22/16 1250  NA 134*  K 3.3*  CL 101  CO2 21*  GLUCOSE 99  BUN 12  CREATININE 0.77  CALCIUM 8.6*  AST 24  ALT 18  ALKPHOS 77  BILITOT 0.3   ------------------------------------------------------------------------------------------------------------------  Cardiac Enzymes  Recent Labs Lab 04/22/16 1250  TROPONINI 0.03*   ------------------------------------------------------------------------------------------------------------------  RADIOLOGY:  Dg Chest 2 View  04/22/2016  CLINICAL DATA:  Shortness of breath and chest pain EXAM: CHEST  2 VIEW COMPARISON:  06/11/2015 FINDINGS: Cardiac shadow is mildly enlarged. Aortic calcifications are again noted. Elevation of the right hemidiaphragm is  again seen. The lungs are clear bilaterally. IMPRESSION: No active cardiopulmonary disease. Electronically Signed   By: Inez Catalina M.D.   On: 04/22/2016 13:15   Ct Abdomen Pelvis W Contrast  04/22/2016  CLINICAL DATA:  Multiple complaints. Rectal bleeding. Abdominal hernia. EXAM: CT ABDOMEN AND PELVIS WITH CONTRAST TECHNIQUE: Multidetector CT imaging of the abdomen and pelvis was performed using the standard protocol following bolus administration of intravenous contrast. CONTRAST:  166mL ISOVUE-300 IOPAMIDOL (ISOVUE-300) INJECTION 61% COMPARISON:  08/08/2013 FINDINGS: Lower chest:  Lung bases are clear.  Mild cardiomegaly. Hepatobiliary: Normal liver. No liver mass. Prior cholecystectomy. Intrahepatic biliary ductal dilatation and mild dilatation of the common bile duct measuring up to 10 mm likely reflecting a post cholecystectomy state. Pancreas: Normal. Spleen: Normal. Adrenals/Urinary Tract: Normal  adrenal glands. 4.2 x 3.9 cm hypodense, fluid attenuating exophytic left renal mass most consistent with a cyst. Right upper pole renal scarring. Mildly distended bladder without focal abnormality. Stomach/Bowel: Sigmoid diverticulosis. Severe rectosigmoid bowel wall thickening and surrounding inflammatory changes most concerning for diverticulitis. No bowel dilatation to suggest obstruction. Right paraumbilical abdominal wall hernia containing multiple loops of nondilated small bowel. Vascular/Lymphatic: Abdominal aortic atherosclerosis. No lymphadenopathy. Reproductive: Prior hysterectomy.  No adnexal mass. Other: No fluid collection or hematoma. Musculoskeletal: Degenerative disc disease at L4-5 and L5-S1 with grade 1 anterolisthesis of L4 on L5 and L5 on S1 secondary to bilateral severe facet arthropathy. S-shaped scoliosis of the thoracolumbar spine. Facet arthropathy throughout the visualized thoracolumbar spine. IMPRESSION: 1. Sigmoid diverticulosis. Severe rectosigmoid bowel wall thickening and surrounding inflammatory changes most concerning for diverticulitis. 2. Right paraumbilical abdominal wall hernia containing multiple loops of nondilated small bowel. 3. Aortic atherosclerosis. Electronically Signed   By: Kathreen Devoid   On: 04/22/2016 15:22    EKG:   Orders placed or performed during the hospital encounter of 04/22/16  . EKG 12-Lead  . EKG 12-Lead  . EKG 12-Lead  . EKG 12-Lead    IMPRESSION AND PLAN:   acute sigmoid iverticulitis The patient will be admitted to medical [place]. Continue Cipro and Flagyl IV.   GI bleeding and anemia.  GI bleeding is possible de to diverticulosis.  Follow up hemoglobin and GI consult.   hypokalemia. Potasium supplement and follow-up BMP.    hypetension, continue hypertension medication.  All the records are reviewed and case discussed with ED provider. Management plans discussed with the patient, her son and they are in agreement.  CODE STATUS:  Do NOT RESUSCITATE per patent.  TOTAL TIME TAKING CARE OF THIS PATIENT: 55 minutes.    Demetrios Loll M.D on 04/22/2016 at 4:21 PM  Between 7am to 6pm - Pager - [phone]  After 6pm go to [email or web address] - password [email or web address] [facility]  [facility] [place]  Office  [phone]  CC: Primary care physician; Tommi Rumps, MD

## 2016-04-23 LAB — HEMOGLOBIN
HEMOGLOBIN: 8.1 g/dL — AB (ref 12.0–16.0)
Hemoglobin: 7.3 g/dL — ABNORMAL LOW (ref 12.0–16.0)
Hemoglobin: 7.4 g/dL — ABNORMAL LOW (ref 12.0–16.0)

## 2016-04-23 LAB — BASIC METABOLIC PANEL
Anion gap: 7 (ref 5–15)
BUN: 8 mg/dL (ref 6–20)
CO2: 24 mmol/L (ref 22–32)
CREATININE: 0.58 mg/dL (ref 0.44–1.00)
Calcium: 7.9 mg/dL — ABNORMAL LOW (ref 8.9–10.3)
Chloride: 105 mmol/L (ref 101–111)
GFR calc Af Amer: >60 mL/min (ref >60)
Glucose, Bld: 115 mg/dL — ABNORMAL HIGH (ref 65–99)
Potassium: 3.5 mmol/L (ref 3.5–5.1)
SODIUM: 136 mmol/L (ref 135–145)

## 2016-04-23 LAB — CBC
HCT: 22.4 % — ABNORMAL LOW (ref 35.0–47.0)
Hemoglobin: 7.4 g/dL — ABNORMAL LOW (ref 12.0–16.0)
MCH: 25.4 pg — ABNORMAL LOW (ref 26.0–34.0)
MCHC: 32.9 g/dL (ref 32.0–36.0)
MCV: 77 fL — AB (ref 80.0–100.0)
PLATELETS: 386 10*3/uL (ref 150–440)
RBC: 2.91 MIL/uL — ABNORMAL LOW (ref 3.80–5.20)
RDW: 17.6 % — AB (ref 11.5–14.5)
WBC: 6.6 10*3/uL (ref 3.6–11.0)

## 2016-04-23 MED ORDER — METRONIDAZOLE 250 MG PO TABS: 250.0000 mg | ORAL_TABLET | Freq: Three times a day (TID) | ORAL | Status: DC

## 2016-04-23 MED ORDER — FERROUS SULFATE 325 (65 FE) MG PO TABS: 325.0000 mg | ORAL_TABLET | Freq: Two times a day (BID) | ORAL | Status: DC

## 2016-04-23 MED ORDER — CIPROFLOXACIN HCL 500 MG PO TABS: 500.0000 mg | ORAL_TABLET | Freq: Two times a day (BID) | ORAL | Status: DC

## 2016-04-23 NOTE — NC FL2 (Addendum)
[place] LEVEL OF CARE SCREENING TOOL     IDENTIFICATION  Patient Name: Cindy Fitzpatrick Birthdate: 20-Mar-1936 Sex: female Admission Date (Current Location): 04/22/2016  [place] and [place] Number:  Engineering geologist and Address:  [facility], [address], [place], [place] [address]      Provider Number: [id number]  Attending Physician Name and Address:  Vaughan Basta, MD  Relative Name and Phone Number:       Current Level of Care: Hospital Recommended Level of Care: [place] Prior Approval Number:    Date Approved/Denied:   PASRR Number:   [id number]  Discharge Plan: SNF    Current Diagnoses: Patient Active Problem List   Diagnosis Date Noted  . Acute diverticulitis 04/22/2016  . Heart murmur 04/05/2016  . Umbilical hernia [id number]  . Osteopenia 01/21/2016  . Screening for osteoporosis 12/25/2015  . Fall at home 12/25/2015  . Need for prophylactic measure 07/03/2015  . Scalp laceration 07/03/2015  . Chronic pain syndrome 02/13/2015  . Fatigue 02/13/2015  . HTN (hypertension) 02/13/2015  . Obesity (BMI 30-39.9) 12/01/2014  . Routine general medical examination at a health care facility 12/01/2014  . Bipolar affective disorder ([place]) 03/25/2014  . Rheumatoid arthritis ([place]) 03/25/2014  . Absolute anemia 03/25/2014  . CAD in native artery 03/25/2014  . Acid reflux 03/25/2014  . Benign essential HTN 03/25/2014  . Adaptive colitis 03/25/2014  . HLD (hyperlipidemia) 03/25/2014    Orientation RESPIRATION BLADDER Height & Weight     Self, Situation, Place  Normal Incontinent Weight: 164 lb 6.4 oz (74.571 kg) Height:  5\' 3"  (160 cm)  BEHAVIORAL SYMPTOMS/MOOD NEUROLOGICAL BOWEL NUTRITION STATUS   (none)  (none) Continent Diet (clear liquid)  AMBULATORY STATUS COMMUNICATION OF NEEDS Skin   Limited Assist Verbally Normal                       Personal Care Assistance Level of Assistance   Bathing, Dressing Bathing Assistance: Limited assistance   Dressing Assistance: Limited assistance     Functional Limitations Info             SPECIAL CARE FACTORS FREQUENCY  PT (By licensed PT)                    Contractures Contractures Info: Not present    Additional Factors Info  Code Status, Allergies Code Status Info: DNR Allergies Info: codein; hydrocodone-acet; meperidine;niacin er; solifenqcin; sulfa;fish products, nabumetone;oxycodone-acet           Current Medications (04/23/2016):  This is the current hospital active medication list Current Facility-Administered Medications  Medication Dose Route Frequency Provider Last Rate Last Dose  . 0.9 %  sodium chloride infusion   Intravenous Continuous Vaughan Basta, MD 50 mL/hr at 04/23/16 1307    . albuterol (PROVENTIL) (2.5 MG/3ML) 0.083% nebulizer solution 2.5 mg  2.5 mg Nebulization Q2H PRN Demetrios Loll, MD      . buPROPion [facility] SR) 12 hr tablet 150 mg  150 mg Oral Daily Demetrios Loll, MD   150 mg at 04/23/16 [id number]  . ciprofloxacin (CIPRO) IVPB 400 mg  400 mg Intravenous Q12H Demetrios Loll, MD   400 mg at 04/23/16 0936  . cloNIDine (CATAPRES) tablet 0.1 mg  0.1 mg Oral BID Demetrios Loll, MD   0.1 mg at 04/23/16 [id number]  . diltiazem (CARDIZEM) tablet 30 mg  30 mg Oral TID Demetrios Loll, MD   30 mg at 04/23/16  1506  . losartan (COZAAR) tablet 50 mg  50 mg Oral Daily Demetrios Loll, MD   50 mg at 04/23/16 [id number]   And  . hydrochlorothiazide (MICROZIDE) capsule 12.5 mg  12.5 mg Oral Daily Demetrios Loll, MD   12.5 mg at 04/23/16 0936  . metroNIDAZOLE (FLAGYL) IVPB 500 mg  500 mg Intravenous Q6H Demetrios Loll, MD   500 mg at 04/23/16 1506  . nitroGLYCERIN (NITRODUR - Dosed in mg/24 hr) patch 0.4 mg  0.4 mg Transdermal Daily Demetrios Loll, MD   0.4 mg at 04/23/16 0939  . ondansetron (ZOFRAN) tablet 4 mg  4 mg Oral Q6H PRN Demetrios Loll, MD       Or  . ondansetron [facility]) injection 4 mg  4 mg Intravenous Q6H PRN Demetrios Loll, MD      .  pantoprazole (PROTONIX) EC tablet 40 mg  40 mg Oral BID AC Demetrios Loll, MD   40 mg at 04/23/16 0751  . polyvinyl alcohol (LIQUIFILM TEARS) 1.4 % ophthalmic solution 1 drop  1 drop Both Eyes PRN Demetrios Loll, MD      . potassium chloride SA (K-DUR,KLOR-CON) CR tablet 40 mEq  40 mEq Oral Once Demetrios Loll, MD      . sertraline (ZOLOFT) tablet 200 mg  200 mg Oral QHS Demetrios Loll, MD   200 mg at 04/22/16 2300     Discharge Medications: Please see discharge summary for a list of discharge medications.  Relevant Imaging Results:  Relevant Lab Results:   Additional Information SS: [id number]  Shela Leff, LCSW

## 2016-04-23 NOTE — Progress Notes (Signed)
04/23/2016 10:30  Spoke to Dr. Anselm Jungling regarding pt hemoglobin of 7.4 with G.I bleed.  Explained that administration has requested that in the case of patients with needed GI consults on days without coverage, we explore the option of transferring pt to [facility] hospital to prevent a delay in care.  Dr. Anselm Jungling rounded on the pt and plans to draw labs to check hemoglobin Q6 hours and try to schedule Endoscopy or other G.I. Procedure outpatient.  Will continue to monitor and assess.  Dola Argyle, RN

## 2016-04-23 NOTE — Progress Notes (Signed)
Called Dr. Marcille Blanco regarding patient's Hgb- 7.4.  Doctor will continue to monitor patient's progress.  Christene Slates 04/23/2016  5:56 AM

## 2016-04-23 NOTE — Progress Notes (Signed)
[facility] NAME: Cindy Fitzpatrick    MR#:  [id number]  DATE OF BIRTH:  1936-02-13  SUBJECTIVE:  CHIEF COMPLAINT:   Chief Complaint  Patient presents with  . Rectal Bleeding   Came with rectal bleed- had sigmoid diverticulitis on CT abd.   Hb reasonably stable.  She have abdominal wall hernia- reducible. Overall she feels better today.   Her last BM was last night, but again in afternoon had BM which was dark.   Vitals stable. Son in room.  REVIEW OF SYSTEMS:  CONSTITUTIONAL: No fever, fatigue or weakness.  EYES: No blurred or double vision.  EARS, NOSE, AND THROAT: No tinnitus or ear pain.  RESPIRATORY: No cough, shortness of breath, wheezing or hemoptysis.  CARDIOVASCULAR: No chest pain, orthopnea, edema.  GASTROINTESTINAL: No nausea, vomiting, diarrhea or abdominal pain. Blood in stool. GENITOURINARY: No dysuria, hematuria.  ENDOCRINE: No polyuria, nocturia,  HEMATOLOGY: No anemia, easy bruising or bleeding SKIN: No rash or lesion. MUSCULOSKELETAL: No joint pain or arthritis.   NEUROLOGIC: No tingling, numbness, weakness.  PSYCHIATRY: No anxiety or depression.   ROS  DRUG ALLERGIES:   Allergies  Allergen Reactions  . Codeine     Other reaction(s): Headache  . Hydrocodone-Acetaminophen Nausea And Vomiting  . Meperidine     Other reaction(s): Dizziness  . Niacin Er Other (See Comments)    Patient doesn't remember  . Other Nausea Only  . Propoxyphene     Other reaction(s): Dizziness  . Solifenacin Other (See Comments)  . Sulfa Antibiotics Other (See Comments)    Patient doesn' t remember  . Fish-Derived Products Rash    Weakness  . Nabumetone Rash  . Oxycodone-Acetaminophen Rash    Couldn't think or remember anything    VITALS:  Blood pressure 134/46, pulse 69, temperature 98.3 F (36.8 C), temperature source Oral, resp. rate 20, height 5\' 3"  (1.6 m), weight 74.571 kg (164 lb 6.4 oz), SpO2 96 %.  PHYSICAL  EXAMINATION:  GENERAL:  80 y.o.-year-old patient lying in the bed with no acute distress.  EYES: Pupils equal, round, reactive to light and accommodation. No scleral icterus. Extraocular muscles intact.  HEENT: Head atraumatic, normocephalic. Oropharynx and nasopharynx clear.  NECK:  Supple, no jugular venous distention. No thyroid enlargement, no tenderness.  LUNGS: Normal breath sounds bilaterally, no wheezing, rales,rhonchi or crepitation. No use of accessory muscles of respiration.  CARDIOVASCULAR: S1, S2 normal. Systolic murmurs,no rubs, or gallops.  ABDOMEN: Soft, nontender, nondistended. Bowel sounds present. No organomegaly or mass. Abdominal wall hernia. EXTREMITIES: No pedal edema, cyanosis, or clubbing.  NEUROLOGIC: Cranial nerves II through XII are intact. Muscle strength 5/5 in all extremities. Sensation intact. Gait not checked.  PSYCHIATRIC: The patient is alert and oriented x 3.  SKIN: No obvious rash, lesion, or ulcer.   Physical Exam LABORATORY PANEL:   CBC  Recent Labs Lab 04/23/16 0501 04/23/16 1349  WBC 6.6  --   HGB 7.4* 7.3*  HCT 22.4*  --   PLT 386  --    ------------------------------------------------------------------------------------------------------------------  Chemistries   Recent Labs Lab 04/22/16 1250 04/22/16 1928 04/23/16 0501  NA 134*  --  136  K 3.3*  --  3.5  CL 101  --  105  CO2 21*  --  24  GLUCOSE 99  --  115*  BUN 12  --  8  CREATININE 0.77  --  0.58  CALCIUM 8.6*  --  7.9*  MG  --  1.9  --   AST 24  --   --   ALT 18  --   --   ALKPHOS 77  --   --   BILITOT 0.3  --   --    ------------------------------------------------------------------------------------------------------------------  Cardiac Enzymes  Recent Labs Lab 04/22/16 1250  TROPONINI 0.03*   ------------------------------------------------------------------------------------------------------------------  RADIOLOGY:  Dg Chest 2 View  04/22/2016   CLINICAL DATA:  Shortness of breath and chest pain EXAM: CHEST  2 VIEW COMPARISON:  06/11/2015 FINDINGS: Cardiac shadow is mildly enlarged. Aortic calcifications are again noted. Elevation of the right hemidiaphragm is again seen. The lungs are clear bilaterally. IMPRESSION: No active cardiopulmonary disease. Electronically Signed   By: Inez Catalina M.D.   On: 04/22/2016 13:15   Ct Abdomen Pelvis W Contrast  04/22/2016  CLINICAL DATA:  Multiple complaints. Rectal bleeding. Abdominal hernia. EXAM: CT ABDOMEN AND PELVIS WITH CONTRAST TECHNIQUE: Multidetector CT imaging of the abdomen and pelvis was performed using the standard protocol following bolus administration of intravenous contrast. CONTRAST:  184mL ISOVUE-300 IOPAMIDOL (ISOVUE-300) INJECTION 61% COMPARISON:  08/08/2013 FINDINGS: Lower chest:  Lung bases are clear.  Mild cardiomegaly. Hepatobiliary: Normal liver. No liver mass. Prior cholecystectomy. Intrahepatic biliary ductal dilatation and mild dilatation of the common bile duct measuring up to 10 mm likely reflecting a post cholecystectomy state. Pancreas: Normal. Spleen: Normal. Adrenals/Urinary Tract: Normal adrenal glands. 4.2 x 3.9 cm hypodense, fluid attenuating exophytic left renal mass most consistent with a cyst. Right upper pole renal scarring. Mildly distended bladder without focal abnormality. Stomach/Bowel: Sigmoid diverticulosis. Severe rectosigmoid bowel wall thickening and surrounding inflammatory changes most concerning for diverticulitis. No bowel dilatation to suggest obstruction. Right paraumbilical abdominal wall hernia containing multiple loops of nondilated small bowel. Vascular/Lymphatic: Abdominal aortic atherosclerosis. No lymphadenopathy. Reproductive: Prior hysterectomy.  No adnexal mass. Other: No fluid collection or hematoma. Musculoskeletal: Degenerative disc disease at L4-5 and L5-S1 with grade 1 anterolisthesis of L4 on L5 and L5 on S1 secondary to bilateral severe facet  arthropathy. S-shaped scoliosis of the thoracolumbar spine. Facet arthropathy throughout the visualized thoracolumbar spine. IMPRESSION: 1. Sigmoid diverticulosis. Severe rectosigmoid bowel wall thickening and surrounding inflammatory changes most concerning for diverticulitis. 2. Right paraumbilical abdominal wall hernia containing multiple loops of nondilated small bowel. 3. Aortic atherosclerosis. Electronically Signed   By: Kathreen Devoid   On: 04/22/2016 15:22    ASSESSMENT AND PLAN:   Principal Problem:   Acute diverticulitis  * Acute diverticulitis * Ac on ch anemia due to GI blood loss * Hypertension  CT abdomen confirmed Diverticulitis. Started on IV cipro and Flagyl- and she had improvement. Start on liquid diet. Gi consult ordered, but we don't have any coverage.   Currently Looks Lower Gi bleed due to diverticulitis and Hb >7- in Feb 2017 was 9.  No need for transfusion.   Monitor for now.   If Bleed more , or Hb < 7- need transfer to [facility] cone.   Vitals stable.   All the records are reviewed and case discussed with Care Management/Social Workerr. Management plans discussed with the patient, family and they are in agreement.  CODE STATUS: DNR  TOTAL TIME TAKING CARE OF THIS PATIENT: 35 utes.     POSSIBLE D/C IN 2-3 D, DEPENDING ON CLINICAL CONDITION.   Vaughan Basta M.D on 04/23/2016   Between 7am to 6pm - Pager - [phone]  After 6pm go to [email or web address] - password [email or web address] [facility]  Office  [phone]  CC: Primary  care physician; Tommi Rumps, MD  Note: This dictation was prepared with Dragon dictation along with smaller phrase technology. Any transcriptional errors that result from this process are unintentional.

## 2016-04-23 NOTE — Evaluation (Signed)
Physical Therapy Evaluation Patient Details Name: Cindy Fitzpatrick MRN: [id number] DOB: December 31, 1935 Today's Date: 04/23/2016   History of Present Illness  80 y.o. female with a known history of diverticulitis, colon polyp, skin cancer and GERD. The patient presented to the ED with rectal bleeding, abdominal pain, vomitting and diarrhea. She had worseing abdominal pain around the abdominal hernia area. CT of the abdomen showed sigmoid diverticulitis.on-call GI physicians suggested antibiotic treatment.  Clinical Impression  Pt does relatively well with PT but is generally weak and fatigues quickly with minimal ambulation.  She shows good effort t/o the session and is safe but has some confusion and generally needs frequent cuing to stay on task.     Follow Up Recommendations SNF    Equipment Recommendations       Recommendations for Other Services       Precautions / Restrictions Precautions Precautions: Fall Restrictions Weight Bearing Restrictions: No      Mobility  Bed Mobility Overal bed mobility: Needs Assistance Bed Mobility: Supine to Sit     Supine to sit: Min assist     General bed mobility comments: Pt is slow, but needs only minimal assist to get to EOB.   Transfers Overall transfer level: Modified independent Equipment used: Rolling walker (2 wheeled)             General transfer comment: Pt is able to get to standing with FWW and light cuing.   Ambulation/Gait Ambulation/Gait assistance: Min guard Ambulation Distance (Feet): 40 Feet Assistive device: Rolling walker (2 wheeled)       General Gait Details: Pt becomes very fatigued with the minimal effort of in-room ambulation but was safe and steady the entire time. She was slow but consistent.   Stairs            Wheelchair Mobility    Modified Rankin (Stroke Patients Only)       Balance                                             Pertinent Vitals/Pain Pain  Assessment: No/denies pain    Home Living Family/patient expects to be discharged to:: [place]: Walker - 4 wheels      Prior Function Level of Independence: Independent with assistive device(s)         Comments: Pt appears to be somewhat confused, but likely is able to walk down to the dinning has as she reported     Hand Dominance        Extremity/Trunk Assessment   Upper Extremity Assessment: Generalized weakness           Lower Extremity Assessment: Generalized weakness         Communication      Cognition Arousal/Alertness: Awake/alert Behavior During Therapy: WFL for tasks assessed/performed Overall Cognitive Status: Difficult to assess                      General Comments      Exercises        Assessment/Plan    PT Assessment Patient needs continued PT services  PT Diagnosis Difficulty walking;Generalized weakness   PT Problem List Decreased strength;Decreased activity tolerance;Decreased balance;Decreased mobility;Decreased coordination;Decreased knowledge of use of DME;Decreased safety awareness  PT Treatment Interventions  DME instruction;Gait training;Functional mobility training;Therapeutic activities;Therapeutic exercise;Balance training;Patient/family education   PT Goals (Current goals can be found in the Care Plan section) Acute Rehab PT Goals Patient Stated Goal: go back to her facility PT Goal Formulation: With patient Time For Goal Achievement: 05/07/16 Potential to Achieve Goals: Fair    Frequency Min 2X/week   Barriers to discharge        Co-evaluation               End of Session Equipment Utilized During Treatment: Gait belt Activity Tolerance: Patient tolerated treatment well;Patient limited by lethargy Patient left: with bed alarm set;with call bell/phone within reach           Time: 1650-1712 PT Time Calculation (min) (ACUTE ONLY): 22  min   Charges:   PT Evaluation $PT Eval Low Complexity: 1 Procedure     PT G Codes:        Kreg Shropshire, DPT 04/23/2016, 6:20 PM

## 2016-04-23 NOTE — Progress Notes (Signed)
Initial Nutrition Assessment  DOCUMENTATION CODES:   Not applicable  INTERVENTION:  -Await diet progression as medically able -If po intake inadequate on follow-up, recommend addition of nutritional supplement  NUTRITION DIAGNOSIS:   Inadequate oral intake related to altered GI function, acute illness as evidenced by per patient/family report (CL diet).  GOAL:   Patient will meet greater than or equal to 90% of their needs  MONITOR:   Diet advancement, Labs, Weight trends  REASON FOR ASSESSMENT:   Malnutrition Screening Tool    ASSESSMENT:    80 yo female admitted with abdominal pain and rectal bleeding with acute diverticulitis GI consult pending, pt tolerated CL tray today. Pt reports fair appetite prior to admission  Past Medical History  Diagnosis Date  . Arthritis   . Depression   . Diverticulitis   . Blood in stool   . Cancer ([place])     skin  . Chicken pox     SHINGLES TWICE  . Hay fever   . GERD (gastroesophageal reflux disease)   . Heart disease   . Heart murmur   . Hyperlipidemia   . Colon polyps      Diet Order:  Diet clear liquid Room service appropriate?: Yes; Fluid consistency:: Thin  Skin:  Reviewed, no issues  Last BM:  04/23/16  Labs: Hgb 7.3  Meds:  NS at 50 ml/hr  Height:   Ht Readings from Last 1 Encounters:  04/22/16 5\' 3"  (1.6 m)    Weight: pt thinks she has lost a few pounds but unsure; per wt encounters, pt wt up over the pas tmonth; but down since March  Wt Readings from Last 1 Encounters:  04/22/16 164 lb 6.4 oz (74.571 kg)    Wt Readings from Last 10 Encounters:  04/22/16 164 lb 6.4 oz (74.571 kg)  04/05/16 157 lb 6.4 oz (71.396 kg)  03/15/16 160 lb (72.576 kg)  01/19/16 167 lb 3.2 oz (75.841 kg)  12/18/15 166 lb 12 oz (75.637 kg)  12/12/15 172 lb (78.019 kg)  09/15/15 177 lb (80.287 kg)  06/20/15 181 lb (82.101 kg)  06/13/15 179 lb 3.2 oz (81.285 kg)  06/11/15 180 lb (81.647 kg)    BMI:  Body mass index is  29.13 kg/(m^2).  Estimated Nutritional Needs:   Kcal:  1600-1900 kcals   Protein:  74-88 g  Fluid:  >/= 1.6  EDUCATION NEEDS:   No education needs identified at this time  [place], [place], [place] [phone] Pager  [phone] Weekend/On-Call Pager

## 2016-04-23 NOTE — Discharge Summary (Signed)
[facility] NAME: Cindy Fitzpatrick    MR#:  [id number]  DATE OF BIRTH:  [birth year]  DATE OF ADMISSION:  04/22/2016 ADMITTING PHYSICIAN: Demetrios Loll, MD  DATE OF DISCHARGE: 04/23/2016  PRIMARY CARE PHYSICIAN: Tommi Rumps, MD    ADMISSION DIAGNOSIS:  Diverticulitis of small intestine without perforation or abscess with bleeding [K57.13]  DISCHARGE DIAGNOSIS:  Principal Problem:   Acute diverticulitis   Gi bleed.  SECONDARY DIAGNOSIS:   Past Medical History  Diagnosis Date  . Arthritis   . Depression   . Diverticulitis   . Blood in stool   . Cancer ([place])     skin  . Chicken pox     SHINGLES TWICE  . Hay fever   . GERD (gastroesophageal reflux disease)   . Heart disease   . Heart murmur   . Hyperlipidemia   . Colon polyps     HOSPITAL COURSE:   Acute diverticulitis Ac on ch anemia due to GI blood loss Hypertension  CT abdomen confirmed Diverticulitis. Started on IV cipro and Flagyl- and she had improvement. Hb followed - slight drop due to IV fluids and minor bleed, no need for transfusion. Monitored.  Advised to follow with GI clinic to have colonoscopy.  DISCHARGE CONDITIONS:   Stable.  CONSULTS OBTAINED:     DRUG ALLERGIES:   Allergies  Allergen Reactions  . Codeine     Other reaction(s): Headache  . Hydrocodone-Acetaminophen Nausea And Vomiting  . Meperidine     Other reaction(s): Dizziness  . Niacin Er Other (See Comments)    Patient doesn't remember  . Other Nausea Only  . Propoxyphene     Other reaction(s): Dizziness  . Solifenacin Other (See Comments)  . Sulfa Antibiotics Other (See Comments)    Patient doesn' t remember  . Fish-Derived Products Rash    Weakness  . Nabumetone Rash  . Oxycodone-Acetaminophen Rash    Couldn't think or remember anything    DISCHARGE MEDICATIONS:   Current Discharge Medication List    START taking these medications   Details  ciprofloxacin  (CIPRO) 500 MG tablet Take 1 tablet (500 mg total) by mouth 2 (two) times daily. Qty: 16 tablet, Refills: 0    ferrous sulfate 325 (65 FE) MG tablet Take 1 tablet (325 mg total) by mouth 2 (two) times daily with a meal. Qty: 60 tablet, Refills: 2    metroNIDAZOLE (FLAGYL) 250 MG tablet Take 1 tablet (250 mg total) by mouth 3 (three) times daily. Qty: 24 tablet, Refills: 0      CONTINUE these medications which have NOT CHANGED   Details  acetaminophen (TYLENOL) 650 MG CR tablet Take 650 mg by mouth every 8 (eight) hours as needed for pain.    bisacodyl (DULCOLAX) 5 MG EC tablet Take 5 mg by mouth 2 (two) times daily as needed for moderate constipation.    buPROPion (WELLBUTRIN SR) 150 MG 12 hr tablet Take 1 tablet (150 mg total) by mouth daily. Qty: 30 tablet, Refills: 2    Calcium Carb-Cholecalciferol 600-800 MG-UNIT TABS Take 1 tablet by mouth daily.     cloNIDine (CATAPRES) 0.1 MG tablet TAKE ONE TABLET TWICE A DAY Qty: 60 tablet, Refills: 2    diltiazem (CARDIZEM) 30 MG tablet TAKE 1 TABLET 3 TIMES DAILY Qty: 270 tablet, Refills: 2    losartan-hydrochlorothiazide (HYZAAR) 50-12.5 MG tablet TAKE 1 TABLET EVERY DAY Qty: 90 tablet, Refills: 3    Multiple Vitamins-Minerals (  CENTRUM SILVER PO) Take 1 tablet by mouth daily.    nitroGLYCERIN (NITRODUR - DOSED IN MG/24 HR) 0.4 mg/hr patch APPLY ONE PATCH TO SKIN ONCE A DAY LEAVE ON 12 TO 14 HOURS THEN REMOVE FOR 10 TO 12 HOURS BEFORE APPLYING THE NEXT PATCH Qty: 30 patch, Refills: 2    NITROSTAT 0.4 MG SL tablet DISSOLVE ONE TABLET UNDER TONGUE IF NEEDED FOR CHEST PAIN Qty: 25 tablet, Refills: 1    Omega-3 Fatty Acids (FISH OIL) 1200 MG CAPS Take 1 capsule by mouth daily.     omeprazole (PRILOSEC) 20 MG capsule Take 20 mg by mouth 2 (two) times daily before a meal.    Polyethyl Glycol-Propyl Glycol 0.4-0.3 % SOLN Apply 1 drop to eye as needed (dry eyes).     sertraline (ZOLOFT) 100 MG tablet Take 2 tablets by mouth at bedtime.       STOP taking these medications     aspirin EC 81 MG tablet          DISCHARGE INSTRUCTIONS:    Follow with GI in office.  If you experience worsening of your admission symptoms, develop shortness of breath, life threatening emergency, suicidal or homicidal thoughts you must seek medical attention immediately by calling 911 or calling your MD immediately  if symptoms less severe.  You Must read complete instructions/literature along with all the possible adverse reactions/side effects for all the Medicines you take and that have been prescribed to you. Take any new Medicines after you have completely understood and accept all the possible adverse reactions/side effects.   Please note  You were cared for by a hospitalist during your hospital stay. If you have any questions about your discharge medications or the care you received while you were in the hospital after you are discharged, you can call the unit and asked to speak with the hospitalist on call if the hospitalist that took care of you is not available. Once you are discharged, your primary care physician will handle any further medical issues. Please note that NO REFILLS for any discharge medications will be authorized once you are discharged, as it is imperative that you return to your primary care physician (or establish a relationship with a primary care physician if you do not have one) for your aftercare needs so that they can reassess your need for medications and monitor your lab values.    Today   CHIEF COMPLAINT:   Chief Complaint  Patient presents with  . Rectal Bleeding    HISTORY OF PRESENT ILLNESS:  Cindy Fitzpatrick  is a 80 y.o. female with a known history of diverticulitis, colon polyp, skin cancer and GERD. The patient presented to the ED with rectal bleeding, abdominal pain, vomitting and diarrhea. She had worseing abdominal pain around the abdominal hernia area. CT of the abdomen showed sigmoid  diverticulitis.on-call GI physicians suggested antibiotic treatment.   VITAL SIGNS:  Blood pressure 128/66, pulse 76, temperature 98.1 F (36.7 C), temperature source Oral, resp. rate 18, height 5\' 3"  (1.6 m), weight 74.571 kg (164 lb 6.4 oz), SpO2 95 %.  I/O:   Intake/Output Summary (Last 24 hours) at 04/23/16 1601 Last data filed at 04/23/16 1322  Gross per 24 hour  Intake   1250 ml  Output      0 ml  Net   1250 ml    PHYSICAL EXAMINATION:   GENERAL: 80 y.o.-year-old patient lying in the bed with no acute distress.  EYES: Pupils equal, round, reactive to  light and accommodation. No scleral icterus. Extraocular muscles intact.  HEENT: Head atraumatic, normocephalic. Oropharynx and nasopharynx clear.  NECK: Supple, no jugular venous distention. No thyroid enlargement, no tenderness.  LUNGS: Normal breath sounds bilaterally, no wheezing, rales,rhonchi or crepitation. No use of accessory muscles of respiration.  CARDIOVASCULAR: S1, S2 normal. Systolic murmurs 4/6, no rubs, or gallops.  ABDOMEN: Soft, Abdominal hernia, soft, tenderness around hernia, nondistended. Bowel sounds present. No organomegaly or mass.  EXTREMITIES: No pedal edema, cyanosis, or clubbing.  NEUROLOGIC: Cranial nerves II through XII are intact. Muscle strength 5/5 in all extremities. Sensation intact. Gait not checked.  PSYCHIATRIC: The patient is alert and oriented x 3.  SKIN: No obvious rash, lesion, or ulcer.   DATA REVIEW:   CBC  Recent Labs Lab 04/23/16 0501 04/23/16 1349  WBC 6.6  --   HGB 7.4* 7.3*  HCT 22.4*  --   PLT 386  --     Chemistries   Recent Labs Lab 04/22/16 1250 04/22/16 1928 04/23/16 0501  NA 134*  --  136  K 3.3*  --  3.5  CL 101  --  105  CO2 21*  --  24  GLUCOSE 99  --  115*  BUN 12  --  8  CREATININE 0.77  --  0.58  CALCIUM 8.6*  --  7.9*  MG  --  1.9  --   AST 24  --   --   ALT 18  --   --   ALKPHOS 77  --   --   BILITOT 0.3  --   --     Cardiac  Enzymes  Recent Labs Lab 04/22/16 1250  TROPONINI 0.03*    Microbiology Results  Results for orders placed or performed during the hospital encounter of 04/22/16  MRSA PCR Screening     Status: None   Collection Time: 04/22/16  5:54 PM  Result Value Ref Range Status   MRSA by PCR NEGATIVE NEGATIVE Final    Comment:        The GeneXpert MRSA Assay (FDA approved for NASAL specimens only), is one component of a comprehensive MRSA colonization surveillance program. It is not intended to diagnose MRSA infection nor to guide or monitor treatment for MRSA infections.     RADIOLOGY:  Dg Chest 2 View  04/22/2016  CLINICAL DATA:  Shortness of breath and chest pain EXAM: CHEST  2 VIEW COMPARISON:  06/11/2015 FINDINGS: Cardiac shadow is mildly enlarged. Aortic calcifications are again noted. Elevation of the right hemidiaphragm is again seen. The lungs are clear bilaterally. IMPRESSION: No active cardiopulmonary disease. Electronically Signed   By: Inez Catalina M.D.   On: 04/22/2016 13:15   Ct Abdomen Pelvis W Contrast  04/22/2016  CLINICAL DATA:  Multiple complaints. Rectal bleeding. Abdominal hernia. EXAM: CT ABDOMEN AND PELVIS WITH CONTRAST TECHNIQUE: Multidetector CT imaging of the abdomen and pelvis was performed using the standard protocol following bolus administration of intravenous contrast. CONTRAST:  155mL ISOVUE-300 IOPAMIDOL (ISOVUE-300) INJECTION 61% COMPARISON:  08/08/2013 FINDINGS: Lower chest:  Lung bases are clear.  Mild cardiomegaly. Hepatobiliary: Normal liver. No liver mass. Prior cholecystectomy. Intrahepatic biliary ductal dilatation and mild dilatation of the common bile duct measuring up to 10 mm likely reflecting a post cholecystectomy state. Pancreas: Normal. Spleen: Normal. Adrenals/Urinary Tract: Normal adrenal glands. 4.2 x 3.9 cm hypodense, fluid attenuating exophytic left renal mass most consistent with a cyst. Right upper pole renal scarring. Mildly distended  bladder without focal abnormality. Stomach/Bowel:  Sigmoid diverticulosis. Severe rectosigmoid bowel wall thickening and surrounding inflammatory changes most concerning for diverticulitis. No bowel dilatation to suggest obstruction. Right paraumbilical abdominal wall hernia containing multiple loops of nondilated small bowel. Vascular/Lymphatic: Abdominal aortic atherosclerosis. No lymphadenopathy. Reproductive: Prior hysterectomy.  No adnexal mass. Other: No fluid collection or hematoma. Musculoskeletal: Degenerative disc disease at L4-5 and L5-S1 with grade 1 anterolisthesis of L4 on L5 and L5 on S1 secondary to bilateral severe facet arthropathy. S-shaped scoliosis of the thoracolumbar spine. Facet arthropathy throughout the visualized thoracolumbar spine. IMPRESSION: 1. Sigmoid diverticulosis. Severe rectosigmoid bowel wall thickening and surrounding inflammatory changes most concerning for diverticulitis. 2. Right paraumbilical abdominal wall hernia containing multiple loops of nondilated small bowel. 3. Aortic atherosclerosis. Electronically Signed   By: Kathreen Devoid   On: 04/22/2016 15:22    EKG:   Orders placed or performed during the hospital encounter of 04/22/16  . EKG 12-Lead  . EKG 12-Lead  . EKG 12-Lead  . EKG 12-Lead      Management plans discussed with the patient, family and they are in agreement.  CODE STATUS:     Code Status Orders        Start     Ordered   04/22/16 1720  Do not attempt resuscitation (DNR)   Continuous    Question Answer Comment  In the event of cardiac or respiratory ARREST Do not call a "code blue"   In the event of cardiac or respiratory ARREST Do not perform Intubation, CPR, defibrillation or ACLS   In the event of cardiac or respiratory ARREST Use medication by any route, position, wound care, and other measures to relive pain and suffering. May use oxygen, suction and manual treatment of airway obstruction as needed for comfort.      04/22/16  1719    Code Status History    Date Active Date Inactive Code Status Order ID Comments User Context   This patient has a current code status but no historical code status.    Advance Directive Documentation        Most Recent Value   Type of Advance Directive  Healthcare Power of Attorney   Pre-existing out of facility DNR order (yellow form or pink MOST form)     "MOST" Form in Place?        TOTAL TIME TAKING CARE OF THIS PATIENT: 35 minutes.    Vaughan Basta M.D on 04/23/2016 at 4:01 PM  Between 7am to 6pm - Pager - [phone]  After 6pm go to [email or web address] - password [email or web address] [facility]  Office  [phone]  CC: Primary care physician; Tommi Rumps, MD   Note: This dictation was prepared with Dragon dictation along with smaller phrase technology. Any transcriptional errors that result from this process are unintentional.

## 2016-04-24 ENCOUNTER — Encounter
Admission: RE | Admit: 2016-04-24 | Discharge: 2016-04-24 | Disposition: A | Discharge status: Home / Self Care | Payer: Medicare Other | Source: Ambulatory Visit | Attending: Internal Medicine | Admitting: Internal Medicine

## 2016-04-24 DIAGNOSIS — Z955 Presence of coronary angioplasty implant and graft: Secondary | ICD-10-CM

## 2016-04-24 DIAGNOSIS — K529 Noninfective gastroenteritis and colitis, unspecified: Secondary | ICD-10-CM

## 2016-04-24 DIAGNOSIS — R634 Abnormal weight loss: Secondary | ICD-10-CM

## 2016-04-24 DIAGNOSIS — K5792 Diverticulitis of intestine, part unspecified, without perforation or abscess without bleeding: Secondary | ICD-10-CM

## 2016-04-24 DIAGNOSIS — I25111 Atherosclerotic heart disease of native coronary artery with angina pectoris with documented spasm: Secondary | ICD-10-CM | POA: Insufficient documentation

## 2016-04-24 DIAGNOSIS — R300 Dysuria: Secondary | ICD-10-CM | POA: Insufficient documentation

## 2016-04-24 DIAGNOSIS — E785 Hyperlipidemia, unspecified: Secondary | ICD-10-CM | POA: Insufficient documentation

## 2016-04-24 DIAGNOSIS — D500 Iron deficiency anemia secondary to blood loss (chronic): Secondary | ICD-10-CM

## 2016-04-24 DIAGNOSIS — I209 Angina pectoris, unspecified: Secondary | ICD-10-CM

## 2016-04-24 LAB — BASIC METABOLIC PANEL
ANION GAP: 9 (ref 5–15)
BUN: 7 mg/dL (ref 6–20)
CHLORIDE: 105 mmol/L (ref 101–111)
CO2: 23 mmol/L (ref 22–32)
Calcium: 8.3 mg/dL — ABNORMAL LOW (ref 8.9–10.3)
Creatinine, Ser: 0.62 mg/dL (ref 0.44–1.00)
GFR calc Af Amer: >60 mL/min (ref >60)
GLUCOSE: 111 mg/dL — AB (ref 65–99)
POTASSIUM: 2.6 mmol/L — AB (ref 3.5–5.1)
SODIUM: 137 mmol/L (ref 135–145)

## 2016-04-24 LAB — TROPONIN I
Troponin I: <0.03 ng/mL (ref <0.03)
Troponin I: <0.03 ng/mL (ref <0.03)

## 2016-04-24 LAB — HEMOGLOBIN
HEMOGLOBIN: 7.4 g/dL — AB (ref 12.0–16.0)
Hemoglobin: 7.9 g/dL — ABNORMAL LOW (ref 12.0–16.0)

## 2016-04-24 LAB — MAGNESIUM: MAGNESIUM: 1.7 mg/dL (ref 1.7–2.4)

## 2016-04-24 LAB — POTASSIUM: POTASSIUM: 3.3 mmol/L — AB (ref 3.5–5.1)

## 2016-04-24 MED ORDER — POTASSIUM CHLORIDE CRYS ER 20 MEQ PO TBCR
40.0000 meq | EXTENDED_RELEASE_TABLET | Freq: Two times a day (BID) | ORAL | Status: DC
  Administered 2016-04-24 – 2016-04-26 (×4): 40 meq via ORAL
  Filled 2016-04-24 (×4): qty 2

## 2016-04-24 MED ORDER — MORPHINE SULFATE (PF) 2 MG/ML IV SOLN
INTRAVENOUS | Status: AC
  Filled 2016-04-24: qty 1

## 2016-04-24 MED ORDER — MORPHINE SULFATE (PF) 2 MG/ML IV SOLN
2.0000 mg | Freq: Once | INTRAVENOUS | Status: AC
  Administered 2016-04-24: 2 mg via INTRAVENOUS

## 2016-04-24 MED ORDER — POTASSIUM CHLORIDE IN NACL 40-0.9 MEQ/L-% IV SOLN
INTRAVENOUS | Status: DC
  Administered 2016-04-24: 75 mL/h via INTRAVENOUS
  Filled 2016-04-24: qty 1000

## 2016-04-24 NOTE — Progress Notes (Signed)
Received notification from lab of critical potassium of 2.6 and received notification from telemetry of bigeminy PVC - Dr. Anselm Jungling notified. No new orders received at this time. Lauris Poag, RN 04-24-16 at 12:38

## 2016-04-24 NOTE — Progress Notes (Signed)
Pt received from 2c in no distress, pale in appearance, no c/o pain, IV antibiotic infusing. On room air, VSS, offered clear liquids for food, pt tolerated well, NSR 80's on tele. Will continue to asess.

## 2016-04-24 NOTE — Significant Event (Signed)
Rapid Response Event Note  Overview: called to RR in room 207, pt c/o generalized CP      Initial Focused Assessment: awake, alert and oriented x3, VSS   Interventions: Stat EKG, Stat Troponin, Mag, and BMP, 2Mg  morphine once, 2L [place] in place, tele monitoring and transfer to 2A. Dr. Dian Situ at bedside, discussed plan with him and Verne (pts RN).  Plan of Care (if not transferred):  Event Summary:   at      at          [place]

## 2016-04-24 NOTE — Progress Notes (Signed)
Called to 207 for rapid response. Performed EKG.

## 2016-04-24 NOTE — Progress Notes (Signed)
Patient complained of chest discomfort and arm pain. Rapid response called. Pulse 40. Alert. Dr. Anselm Jungling notified. Morphine given stat once per MD order. ekg STAT. Troponin STAT. Telemetry placed. Patient to be transferred to 2A Lauris Poag, RN 04/24/16 1135

## 2016-04-24 NOTE — Consult Note (Signed)
Cardiology Consultation Note  Patient ID: Cindy Fitzpatrick, MRN: [id number], DOB/AGE: [birth year] 78 y.o. Admit date: 04/22/2016   Date of Consult: 04/24/2016 Primary Physician: Tommi Rumps, MD Primary Cardiologist: None  Chief Complaint: Chest pain Reason for Consult: Angina pectoralis Consult placed by Dr. Anselm Jungling for chest pain/angina in the setting of known coronary disease  HPI: 80 y.o. female with h/o coronary artery disease with stent placed to her proximal RCA in 1996, intolerant of statins, depression/anxiety, bipolar disorder, chronic pain/toe,  diverticulitis, hyperlipidemia, heart murmur, presenting to the emergency room 04/22/2016 with rectal bleeding, abdominal pain, vomiting and diarrhea. Labwork confirming anemia. Cardiology was consult at this morning for chest pain symptoms concerning for angina  CT scan on arrival showed sigmoid diverticulitis , she was started on antibiotics Abdominal wall hernia that is reducible  Continued to have dark bowel movements yesterday June 30 Potassium was 3.3 on arrival with hemoglobin 8.1 Hemoglobin stabilized at 7.4 as of this morning Drop in potassium down to 2.6  Chest pain this morning concerning for angina Transferred to to a telemetry for close observation   initial troponin was normal Currently pain-free On further discussion with the patient, she is not clear if she was having chest pain, but does report having some arm pain as IV had infiltrated and nurses were attempting to place new IV  Son presents with her, reports she is not eating, losing dramatic weight. Chronic diarrhea issues over the past several weeks, worse recently in the setting of diverticulitis.  In general she has not been having chest pain Not been able to walk for a far secondary to shortness of breath, weakness  Notes from primary care 03/15/2016 detailing patient was having episodes of chest pain, with some exertional shortness of breath, she was taking  nitroglycerin for symptoms   Past Medical History  Diagnosis Date  . Arthritis   . Depression   . Diverticulitis   . Blood in stool   . Cancer ([place])     skin  . Chicken pox     SHINGLES TWICE  . Hay fever   . GERD (gastroesophageal reflux disease)   . Heart disease   . Heart murmur   . Hyperlipidemia   . Colon polyps       Most Recent Cardiac Studies: Echocardiogram pending   Surgical History:  Past Surgical History  Procedure Laterality Date  . Knee surgery    . Hernia repair       Home Meds: Prior to Admission medications   Medication Sig Start Date End Date Taking? Authorizing Provider  acetaminophen (TYLENOL) 650 MG CR tablet Take 650 mg by mouth every 8 (eight) hours as needed for pain.   Yes Historical Provider, MD  aspirin EC 81 MG tablet Take 81 mg by mouth daily.   Yes Historical Provider, MD  bisacodyl (DULCOLAX) 5 MG EC tablet Take 5 mg by mouth 2 (two) times daily as needed for moderate constipation.   Yes Historical Provider, MD  buPROPion (WELLBUTRIN SR) 150 MG 12 hr tablet Take 1 tablet (150 mg total) by mouth daily. 05/01/15  Yes Rubbie Battiest, NP  Calcium Carb-Cholecalciferol 600-800 MG-UNIT TABS Take 1 tablet by mouth daily.    Yes Historical Provider, MD  cloNIDine (CATAPRES) 0.1 MG tablet TAKE ONE TABLET TWICE A DAY 04/19/16  Yes [name], MD  diltiazem (CARDIZEM) 30 MG tablet TAKE 1 TABLET 3 TIMES DAILY 09/23/15  Yes Rubbie Battiest, NP  losartan-hydrochlorothiazide (HYZAAR) 50-12.5 MG  tablet TAKE 1 TABLET EVERY DAY 10/10/15  Yes Rubbie Battiest, NP  Multiple Vitamins-Minerals (CENTRUM SILVER PO) Take 1 tablet by mouth daily.   Yes Historical Provider, MD  nitroGLYCERIN (NITRODUR - DOSED IN MG/24 HR) 0.4 mg/hr patch APPLY ONE PATCH TO SKIN ONCE A DAY LEAVE ON 12 TO 14 HOURS THEN REMOVE FOR 10 TO 12 HOURS BEFORE APPLYING THE NEXT PATCH 12/18/15  Yes Rubbie Battiest, NP  NITROSTAT 0.4 MG SL tablet DISSOLVE ONE TABLET UNDER TONGUE IF NEEDED FOR CHEST PAIN  10/24/15  Yes Jackolyn Confer, MD  Omega-3 Fatty Acids (FISH OIL) 1200 MG CAPS Take 1 capsule by mouth daily.    Yes Historical Provider, MD  omeprazole (PRILOSEC) 20 MG capsule Take 20 mg by mouth 2 (two) times daily before a meal.   Yes Historical Provider, MD  Polyethyl Glycol-Propyl Glycol 0.4-0.3 % SOLN Apply 1 drop to eye as needed (dry eyes).    Yes Historical Provider, MD  sertraline (ZOLOFT) 100 MG tablet Take 2 tablets by mouth at bedtime. 05/14/15  Yes Historical Provider, MD  ciprofloxacin (CIPRO) 500 MG tablet Take 1 tablet (500 mg total) by mouth 2 (two) times daily. 04/23/16   Vaughan Basta, MD  ferrous sulfate 325 (65 FE) MG tablet Take 1 tablet (325 mg total) by mouth 2 (two) times daily with a meal. 04/23/16   Vaughan Basta, MD  metroNIDAZOLE (FLAGYL) 250 MG tablet Take 1 tablet (250 mg total) by mouth 3 (three) times daily. 04/23/16   Vaughan Basta, MD    Inpatient Medications:  . buPROPion  150 mg Oral Daily  . ciprofloxacin  400 mg Intravenous Q12H  . cloNIDine  0.1 mg Oral BID  . diltiazem  30 mg Oral TID  . losartan  50 mg Oral Daily  . metronidazole  500 mg Intravenous Q6H  . morphine      . nitroGLYCERIN  0.4 mg Transdermal Daily  . pantoprazole  40 mg Oral BID AC  . potassium chloride  40 mEq Oral BID  . sertraline  200 mg Oral QHS   . 0.9 % NaCl with KCl 40 mEq / L 75 mL/hr (04/24/16 1412)    Allergies:  Allergies  Allergen Reactions  . Codeine     Other reaction(s): Headache  . Hydrocodone-Acetaminophen Nausea And Vomiting  . Meperidine     Other reaction(s): Dizziness  . Niacin Er Other (See Comments)    Patient doesn't remember  . Other Nausea Only  . Propoxyphene     Other reaction(s): Dizziness  . Solifenacin Other (See Comments)  . Sulfa Antibiotics Other (See Comments)    Patient doesn' t remember  . Fish-Derived Products Rash    Weakness  . Nabumetone Rash  . Oxycodone-Acetaminophen Rash    Couldn't think or  remember anything    Social History   Social History  . Marital Status: Widowed    Spouse Name: N/A  . Number of Children: N/A  . Years of Education: N/A   Occupational History  . Not on file.   Social History Main Topics  . Smoking status: Never Smoker   . Smokeless tobacco: Never Used  . Alcohol Use: No  . Drug Use: No  . Sexual Activity: No   Other Topics Concern  . Not on file   Social History Narrative     Family History  Problem Relation Age of Onset  . Stroke Mother   . Heart attack Father  Review of Systems: Review of Systems  Constitutional: Negative.   Respiratory: Negative.   Cardiovascular: Positive for chest pain.  Gastrointestinal: Negative.   Musculoskeletal: Negative.   Neurological: Negative.   Psychiatric/Behavioral: Negative.   All other systems reviewed and are negative.   Labs:  Recent Labs  04/22/16 1250 04/24/16 1124  TROPONINI 0.03* <0.03   Lab Results  Component Value Date   WBC 6.6 04/23/2016   HGB 7.4* 04/24/2016   HCT 22.4* 04/23/2016   MCV 77.0* 04/23/2016   PLT 386 04/23/2016    Recent Labs Lab 04/22/16 1250  04/24/16 1132  NA 134*  < > 137  K 3.3*  < > 2.6*  CL 101  < > 105  CO2 21*  < > 23  BUN 12  < > 7  CREATININE 0.77  < > 0.62  CALCIUM 8.6*  < > 8.3*  PROT 5.8*  --   --   BILITOT 0.3  --   --   ALKPHOS 77  --   --   ALT 18  --   --   AST 24  --   --   GLUCOSE 99  < > 111*  < > = values in this interval not displayed. Lab Results  Component Value Date   CHOL 325* 11/27/2014   HDL 47.80 11/27/2014   TRIG 289.0* 11/27/2014   No results found for: DDIMER  Radiology/Studies:  Dg Chest 2 View  04/22/2016  CLINICAL DATA:  Shortness of breath and chest pain EXAM: CHEST  2 VIEW COMPARISON:  06/11/2015 FINDINGS: Cardiac shadow is mildly enlarged. Aortic calcifications are again noted. Elevation of the right hemidiaphragm is again seen. The lungs are clear bilaterally. IMPRESSION: No active  cardiopulmonary disease. Electronically Signed   By: Inez Catalina M.D.   On: 04/22/2016 13:15   Ct Abdomen Pelvis W Contrast  04/22/2016  CLINICAL DATA:  Multiple complaints. Rectal bleeding. Abdominal hernia. EXAM: CT ABDOMEN AND PELVIS WITH CONTRAST TECHNIQUE: Multidetector CT imaging of the abdomen and pelvis was performed using the standard protocol following bolus administration of intravenous contrast. CONTRAST:  122mL ISOVUE-300 IOPAMIDOL (ISOVUE-300) INJECTION 61% COMPARISON:  08/08/2013 FINDINGS: Lower chest:  Lung bases are clear.  Mild cardiomegaly. Hepatobiliary: Normal liver. No liver mass. Prior cholecystectomy. Intrahepatic biliary ductal dilatation and mild dilatation of the common bile duct measuring up to 10 mm likely reflecting a post cholecystectomy state. Pancreas: Normal. Spleen: Normal. Adrenals/Urinary Tract: Normal adrenal glands. 4.2 x 3.9 cm hypodense, fluid attenuating exophytic left renal mass most consistent with a cyst. Right upper pole renal scarring. Mildly distended bladder without focal abnormality. Stomach/Bowel: Sigmoid diverticulosis. Severe rectosigmoid bowel wall thickening and surrounding inflammatory changes most concerning for diverticulitis. No bowel dilatation to suggest obstruction. Right paraumbilical abdominal wall hernia containing multiple loops of nondilated small bowel. Vascular/Lymphatic: Abdominal aortic atherosclerosis. No lymphadenopathy. Reproductive: Prior hysterectomy.  No adnexal mass. Other: No fluid collection or hematoma. Musculoskeletal: Degenerative disc disease at L4-5 and L5-S1 with grade 1 anterolisthesis of L4 on L5 and L5 on S1 secondary to bilateral severe facet arthropathy. S-shaped scoliosis of the thoracolumbar spine. Facet arthropathy throughout the visualized thoracolumbar spine. IMPRESSION: 1. Sigmoid diverticulosis. Severe rectosigmoid bowel wall thickening and surrounding inflammatory changes most concerning for diverticulitis. 2.  Right paraumbilical abdominal wall hernia containing multiple loops of nondilated small bowel. 3. Aortic atherosclerosis. Electronically Signed   By: Kathreen Devoid   On: 04/22/2016 15:22    EKG: EKG from 04/24/2016 Normal sinus rhythm with PVCs  in a bigeminal pattern, right bundle branch block, nonspecific ST abnormality inferior leads No change from prior EKG on arrival 04/22/2016  Weights: Filed Weights   04/22/16 1723 04/24/16 1314  Weight: 164 lb 6.4 oz (74.571 kg) 160 lb 6.4 oz (72.757 kg)     Physical Exam: Blood pressure 129/49, pulse 37, temperature 98.4 F (36.9 C), temperature source Oral, resp. rate 22, height 5\' 3"  (1.6 m), weight 160 lb 6.4 oz (72.757 kg), SpO2 97 %. Body mass index is 28.42 kg/(m^2). General: Well developed, well nourished, in no acute distress, thin, pale. Head: Normocephalic, atraumatic, sclera non-icteric, no xanthomas, nares are without discharge.  Neck: Negative for carotid bruits. JVD not elevated. Lungs: Clear bilaterally to auscultation without wheezes, rales, or rhonchi. Breathing is unlabored. Heart: RRR with S1 S2. 2+ murmur left sternal border radiating to right sternal border, no rubs, or gallops appreciated. Abdomen: Soft, non-tender, non-distended with normoactive bowel sounds. No hepatomegaly. No rebound/guarding. No obvious abdominal masses. Msk:  Strength and tone appear normal for age. Extremities: No clubbing or cyanosis. No edema.  Distal pedal pulses are 2+ and equal bilaterally. Neuro: Alert and oriented X 3. No facial asymmetry. No focal deficit. Moves all extremities spontaneously. Psych:  Responds to questions appropriately with a normal affect.    Assessment and Plan:   1. Chest pain Known coronary artery disease,  Unclear if she was having chest pain symptoms, patient is not clear, though she does report having arm pain probably secondary to IV placement  previous symptoms of chest pain 03/15/2016 when seen by primary  care Would continue to cycle her cardiac enzymes, Anemia may exacerbate underlying angina Consider transfusion up to hemoglobin of 10 -We will closely follow, may benefit from stress testing prior to discharge. Patient has declined aggressive treatment in the past  2) coronary artery disease Previous cardiac catheterization September 1996  95% proximal RCA disease with stent placed  now with  symptoms in the setting of anemia, unclear if she is having anemia, patient is a poor historian  3) hypokalemia Likely secondary to diarrhea, exacerbated by GI bleed Would replete aggressively as you are doing  4) anemia She does have baseline anemia, Worse From recent GI bleed, Hemoglobin was less than 10 back in 11/2015 Needs GI workup, colonoscopy/EGD though patient would likely refuse  5) murmur Echocardiogram has been ordered. Previously noted by primary care  6) hyperlipidemia Patient has refused statins in the past Aortic atherosclerosis seen on CT scan  7) diverticulitis On antibiotics, recent GI bleed, now with anemia On contact precautions  Overall, very ill woman with her climbing health, weight loss, worsening anemia Nutrition is poor May also benefit from repeat visit to hematology for iron infusion after discharge  Total encounter time more than 110 minutes  Greater than 50% was spent in counseling and coordination of care with the patient    Signed, Esmond Plants, MD. Ph.D [facility] 04/24/2016, 2:47 PM

## 2016-04-24 NOTE — Progress Notes (Signed)
[facility] provided pastoral care and prayer as requested by patient.   [place] Priyansh [phone]

## 2016-04-24 NOTE — Progress Notes (Signed)
Patient's PASRR was submitted yesterday 04/23/16 and is pending at this time and will not be obtained until Monday since PASRR is closed over the weekend. CSW will continue to follow and assist.  Ernest Pine, MSW, [place], [place] Social Worker [phone]

## 2016-04-24 NOTE — Progress Notes (Signed)
Report given to Amado Nash, RN Pt resting in bed watching tv in no distress, call bell in reach, phone number visible on board

## 2016-04-24 NOTE — Progress Notes (Signed)
[facility] NAME: Cindy Fitzpatrick    MR#:  [id number]  DATE OF BIRTH:  01-29-1936  SUBJECTIVE:  CHIEF COMPLAINT:   Chief Complaint  Patient presents with  . Rectal Bleeding   Came with rectal bleed- had sigmoid diverticulitis on CT abd.   Hb reasonably stable.  She have abdominal wall hernia- reducible. Overall she feels better today.  Hemoglobin stable.  had episode of chest pain this morning- rapid response- Vitals stable, Checked her labs. Transferred to Tele.  REVIEW OF SYSTEMS:  CONSTITUTIONAL: No fever, fatigue or weakness.  EYES: No blurred or double vision.  EARS, NOSE, AND THROAT: No tinnitus or ear pain.  RESPIRATORY: No cough, shortness of breath, wheezing or hemoptysis.  CARDIOVASCULAR: she had chest pain in morning, no edema. GASTROINTESTINAL: No nausea, vomiting, diarrhea or abdominal pain. Blood in stool. GENITOURINARY: No dysuria, hematuria.  ENDOCRINE: No polyuria, nocturia,  HEMATOLOGY: No anemia, easy bruising or bleeding SKIN: No rash or lesion. MUSCULOSKELETAL: No joint pain or arthritis.   NEUROLOGIC: No tingling, numbness, weakness.  PSYCHIATRY: No anxiety or depression.   ROS  DRUG ALLERGIES:   Allergies  Allergen Reactions  . Codeine     Other reaction(s): Headache  . Hydrocodone-Acetaminophen Nausea And Vomiting  . Meperidine     Other reaction(s): Dizziness  . Niacin Er Other (See Comments)    Patient doesn't remember  . Other Nausea Only  . Propoxyphene     Other reaction(s): Dizziness  . Solifenacin Other (See Comments)  . Sulfa Antibiotics Other (See Comments)    Patient doesn' t remember  . Fish-Derived Products Rash    Weakness  . Nabumetone Rash  . Oxycodone-Acetaminophen Rash    Couldn't think or remember anything    VITALS:  Blood pressure 129/49, pulse 37, temperature 98.4 F (36.9 C), temperature source Oral, resp. rate 22, height 5\' 3"  (1.6 m), weight 72.757 kg (160 lb 6.4  oz), SpO2 97 %.  PHYSICAL EXAMINATION:  GENERAL:  80 y.o.-year-old patient lying in the bed with acute distress due to pain when i saw at 11 am. EYES: Pupils equal, round, reactive to light and accommodation. No scleral icterus. Extraocular muscles intact.  HEENT: Head atraumatic, normocephalic. Oropharynx and nasopharynx clear.  NECK:  Supple, no jugular venous distention. No thyroid enlargement, no tenderness.  LUNGS: Normal breath sounds bilaterally, no wheezing, rales,rhonchi or crepitation. No use of accessory muscles of respiration.  CARDIOVASCULAR: S1, S2 normal. Systolic murmurs,no rubs, or gallops.  ABDOMEN: Soft, nontender, nondistended. Bowel sounds present. No organomegaly or mass. Abdominal wall hernia. EXTREMITIES: No pedal edema, cyanosis, or clubbing.  NEUROLOGIC: Cranial nerves II through XII are intact. Muscle strength 5/5 in all extremities. Sensation intact. Gait not checked.  PSYCHIATRIC: The patient is alert and oriented x 3.  SKIN: No obvious rash, lesion, or ulcer.   Physical Exam LABORATORY PANEL:   CBC  Recent Labs Lab 04/23/16 0501  04/24/16 0523  WBC 6.6  --   --   HGB 7.4*  < > 7.4*  HCT 22.4*  --   --   PLT 386  --   --   < > = values in this interval not displayed. ------------------------------------------------------------------------------------------------------------------  Chemistries   Recent Labs Lab 04/22/16 1250  04/24/16 1132  NA 134*  < > 137  K 3.3*  < > 2.6*  CL 101  < > 105  CO2 21*  < > 23  GLUCOSE 99  < >  111*  BUN 12  < > 7  CREATININE 0.77  < > 0.62  CALCIUM 8.6*  < > 8.3*  MG  --   < > 1.7  AST 24  --   --   ALT 18  --   --   ALKPHOS 77  --   --   BILITOT 0.3  --   --   < > = values in this interval not displayed. ------------------------------------------------------------------------------------------------------------------  Cardiac Enzymes  Recent Labs Lab 04/22/16 1250 04/24/16 1124  TROPONINI 0.03*  <0.03   ------------------------------------------------------------------------------------------------------------------  RADIOLOGY:  Ct Abdomen Pelvis W Contrast  04/22/2016  CLINICAL DATA:  Multiple complaints. Rectal bleeding. Abdominal hernia. EXAM: CT ABDOMEN AND PELVIS WITH CONTRAST TECHNIQUE: Multidetector CT imaging of the abdomen and pelvis was performed using the standard protocol following bolus administration of intravenous contrast. CONTRAST:  133mL ISOVUE-300 IOPAMIDOL (ISOVUE-300) INJECTION 61% COMPARISON:  08/08/2013 FINDINGS: Lower chest:  Lung bases are clear.  Mild cardiomegaly. Hepatobiliary: Normal liver. No liver mass. Prior cholecystectomy. Intrahepatic biliary ductal dilatation and mild dilatation of the common bile duct measuring up to 10 mm likely reflecting a post cholecystectomy state. Pancreas: Normal. Spleen: Normal. Adrenals/Urinary Tract: Normal adrenal glands. 4.2 x 3.9 cm hypodense, fluid attenuating exophytic left renal mass most consistent with a cyst. Right upper pole renal scarring. Mildly distended bladder without focal abnormality. Stomach/Bowel: Sigmoid diverticulosis. Severe rectosigmoid bowel wall thickening and surrounding inflammatory changes most concerning for diverticulitis. No bowel dilatation to suggest obstruction. Right paraumbilical abdominal wall hernia containing multiple loops of nondilated small bowel. Vascular/Lymphatic: Abdominal aortic atherosclerosis. No lymphadenopathy. Reproductive: Prior hysterectomy.  No adnexal mass. Other: No fluid collection or hematoma. Musculoskeletal: Degenerative disc disease at L4-5 and L5-S1 with grade 1 anterolisthesis of L4 on L5 and L5 on S1 secondary to bilateral severe facet arthropathy. S-shaped scoliosis of the thoracolumbar spine. Facet arthropathy throughout the visualized thoracolumbar spine. IMPRESSION: 1. Sigmoid diverticulosis. Severe rectosigmoid bowel wall thickening and surrounding inflammatory changes  most concerning for diverticulitis. 2. Right paraumbilical abdominal wall hernia containing multiple loops of nondilated small bowel. 3. Aortic atherosclerosis. Electronically Signed   By: Kathreen Devoid   On: 04/22/2016 15:22    ASSESSMENT AND PLAN:   Principal Problem:   Acute diverticulitis  * Acute diverticulitis * Ac on ch anemia due to GI blood loss  CT abdomen confirmed Diverticulitis. Started on IV cipro and Flagyl- and she had improvement. Start on liquid diet. Gi consult ordered, but we don't have any coverage.   Currently Looks Lower Gi bleed due to diverticulitis and Hb >7- in Feb 2017 was 9.  No need for transfusion.   Monitor for now.   If Bleed more , or Hb < 7- need transfer to [facility].   HB STABLE, tolerated liquid diet.  * Chest pain   Patient is a history of CAD and stent placement in the past.   Now with anemia and acute infection she is complaining of acute chest pain.   EKG shows bigeminy which is new.   I will transfer to telemetry floor, follow serial troponin.   First troponin is negative.   Cardiology consult for further management.   I informed her son about this event.     * Hypokalemia    Magnesium normal     replace orally and IV. Recheck later today.    * Hypertension   currently stable, continue home medications.   Vitals stable.   All the records are reviewed and case  discussed with Care Management/Social Workerr. Management plans discussed with the patient, family and they are in agreement.  CODE STATUS: DNR  TOTAL TIME TAKING CARE OF THIS CO:[id number] critical care minutes Condition is critical because of acute chest pain, will monitor on telemetry. I spoke to patient's son and informed him.  POssible/C IN 2-3 D, DEPENDING ON CLINICAL CONDITION.   Vaughan Basta M.D on 04/24/2016   Between 7am to 6pm - Pager - [phone]  After 6pm go to [email or web address] - password [email or web address] [facility]  Office   [phone]  CC: Primary care physician; Tommi Rumps, MD  Note: This dictation was prepared with Dragon dictation along with smaller phrase technology. Any transcriptional errors that result from this process are unintentional.

## 2016-04-25 ENCOUNTER — Inpatient Hospital Stay: Admit: 2016-04-25 | Payer: Medicare Other

## 2016-04-25 ENCOUNTER — Inpatient Hospital Stay (HOSPITAL_COMMUNITY)
Admit: 2016-04-25 | Discharge: 2016-04-25 | Disposition: A | Discharge status: Home / Self Care | Payer: Medicare Other | Attending: Cardiovascular Disease | Admitting: Cardiovascular Disease

## 2016-04-25 DIAGNOSIS — K5713 Diverticulitis of small intestine without perforation or abscess with bleeding: Secondary | ICD-10-CM

## 2016-04-25 DIAGNOSIS — I2510 Atherosclerotic heart disease of native coronary artery without angina pectoris: Secondary | ICD-10-CM

## 2016-04-25 LAB — BASIC METABOLIC PANEL
ANION GAP: 6 (ref 5–15)
BUN: 7 mg/dL (ref 6–20)
CO2: 23 mmol/L (ref 22–32)
Calcium: 7.9 mg/dL — ABNORMAL LOW (ref 8.9–10.3)
Chloride: 105 mmol/L (ref 101–111)
Creatinine, Ser: 0.6 mg/dL (ref 0.44–1.00)
GLUCOSE: 135 mg/dL — AB (ref 65–99)
POTASSIUM: 3.5 mmol/L (ref 3.5–5.1)
Sodium: 134 mmol/L — ABNORMAL LOW (ref 135–145)

## 2016-04-25 LAB — CBC
HEMATOCRIT: 23.1 % — AB (ref 35.0–47.0)
Hemoglobin: 7.4 g/dL — ABNORMAL LOW (ref 12.0–16.0)
MCH: 24.7 pg — ABNORMAL LOW (ref 26.0–34.0)
MCHC: 32 g/dL (ref 32.0–36.0)
MCV: 77.1 fL — AB (ref 80.0–100.0)
Platelets: 373 10*3/uL (ref 150–440)
RBC: 3 MIL/uL — AB (ref 3.80–5.20)
RDW: 17.8 % — ABNORMAL HIGH (ref 11.5–14.5)
WBC: 9 10*3/uL (ref 3.6–11.0)

## 2016-04-25 LAB — URINE CULTURE

## 2016-04-25 LAB — ABO/RH: ABO/RH(D): A POS

## 2016-04-25 LAB — C DIFFICILE QUICK SCREEN W PCR REFLEX
C DIFFICLE (CDIFF) ANTIGEN: NEGATIVE
C Diff interpretation: NEGATIVE
C Diff toxin: NEGATIVE

## 2016-04-25 LAB — TROPONIN I

## 2016-04-25 LAB — PREPARE RBC (CROSSMATCH)

## 2016-04-25 MED ORDER — SODIUM CHLORIDE 0.9 % IV SOLN
Freq: Once | INTRAVENOUS | Status: AC
  Administered 2016-04-25: 15:00:00 via INTRAVENOUS

## 2016-04-25 NOTE — Progress Notes (Signed)
Pt. Had multiple bloody stools throughout the shift, slept well throughout the night with no c/o pain or discomfort. Will continue to monitor pt.

## 2016-04-25 NOTE — Progress Notes (Signed)
[facility] [place] NAME: Cindy Fitzpatrick    MR#:  [id number]  DATE OF BIRTH:  1936/08/24  SUBJECTIVE:  CHIEF COMPLAINT:   Chief Complaint  Patient presents with  . Rectal Bleeding   Came with rectal bleed- had sigmoid diverticulitis on CT abd.   Hb reasonably stable.  She have abdominal wall hernia- reducible. Overall she feels better today.  Hemoglobin stable.  had episode of chest pain 04/24/16 morning- rapid response- Vitals stable, follow up troponin and tele stable.   Cardiology suggested to keep Hb between 9-10 due to CAD. Tolerating regular diet.  REVIEW OF SYSTEMS:  CONSTITUTIONAL: No fever, fatigue or weakness.  EYES: No blurred or double vision.  EARS, NOSE, AND THROAT: No tinnitus or ear pain.  RESPIRATORY: No cough, shortness of breath, wheezing or hemoptysis.  CARDIOVASCULAR: she had chest pain in morning, no edema. GASTROINTESTINAL: No nausea, vomiting, diarrhea or abdominal pain. Blood in stool. GENITOURINARY: No dysuria, hematuria.  ENDOCRINE: No polyuria, nocturia,  HEMATOLOGY: No anemia, easy bruising or bleeding SKIN: No rash or lesion. MUSCULOSKELETAL: No joint pain or arthritis.   NEUROLOGIC: No tingling, numbness, weakness.  PSYCHIATRY: No anxiety or depression.   ROS  DRUG ALLERGIES:   Allergies  Allergen Reactions  . Codeine     Other reaction(s): Headache  . Hydrocodone-Acetaminophen Nausea And Vomiting  . Meperidine     Other reaction(s): Dizziness  . Niacin Er Other (See Comments)    Patient doesn't remember  . Other Nausea Only  . Propoxyphene     Other reaction(s): Dizziness  . Solifenacin Other (See Comments)  . Sulfa Antibiotics Other (See Comments)    Patient doesn' t remember  . Fish-Derived Products Rash    Weakness  . Nabumetone Rash  . Oxycodone-Acetaminophen Rash    Couldn't think or remember anything    VITALS:  Blood pressure 118/38, pulse 70, temperature 98.9 F (37.2 C),  temperature source Oral, resp. rate 15, height 5\' 3"  (1.6 m), weight 72.757 kg (160 lb 6.4 oz), SpO2 98 %.  PHYSICAL EXAMINATION:  GENERAL:  80 y.o.-year-old patient lying in the bed with no acute distress . EYES: Pupils equal, round, reactive to light and accommodation. No scleral icterus. Extraocular muscles intact.  HEENT: Head atraumatic, normocephalic. Oropharynx and nasopharynx clear.  NECK:  Supple, no jugular venous distention. No thyroid enlargement, no tenderness.  LUNGS: Normal breath sounds bilaterally, no wheezing, rales,rhonchi or crepitation. No use of accessory muscles of respiration.  CARDIOVASCULAR: S1, S2 normal. Systolic murmurs,no rubs, or gallops.  ABDOMEN: Soft, nontender, nondistended. Bowel sounds present. No organomegaly or mass. Abdominal wall hernia. EXTREMITIES: No pedal edema, cyanosis, or clubbing.  NEUROLOGIC: Cranial nerves II through XII are intact. Muscle strength 5/5 in all extremities. Sensation intact. Gait not checked.  PSYCHIATRIC: The patient is alert and oriented x 3.  SKIN: No obvious rash, lesion, or ulcer.   Physical Exam LABORATORY PANEL:   CBC  Recent Labs Lab 04/25/16 0135  WBC 9.0  HGB 7.4*  HCT 23.1*  PLT 373   ------------------------------------------------------------------------------------------------------------------  Chemistries   Recent Labs Lab 04/22/16 1250  04/24/16 1132  04/25/16 0135  NA 134*  < > 137  --  134*  K 3.3*  < > 2.6*  < > 3.5  CL 101  < > 105  --  105  CO2 21*  < > 23  --  23  GLUCOSE 99  < > 111*  --  135*  BUN 12  < > 7  --  7  CREATININE 0.77  < > 0.62  --  0.60  CALCIUM 8.6*  < > 8.3*  --  7.9*  MG  --   < > 1.7  --   --   AST 24  --   --   --   --   ALT 18  --   --   --   --   ALKPHOS 77  --   --   --   --   BILITOT 0.3  --   --   --   --   < > = values in this interval not  displayed. ------------------------------------------------------------------------------------------------------------------  Cardiac Enzymes  Recent Labs Lab 04/24/16 2033 04/25/16 0135  TROPONINI <0.03 <0.03   ------------------------------------------------------------------------------------------------------------------  RADIOLOGY:  No results found.  ASSESSMENT AND PLAN:   Principal Problem:   Acute diverticulitis Active Problems:   Chronic diarrhea   Weight loss   Iron deficiency anemia due to chronic blood loss   Coronary artery disease involving native coronary artery of native heart with angina pectoris with documented spasm (HCC)   Ischemic chest pain (HCC)   History of coronary artery stent placement   Hyperlipidemia   Diverticulitis of small intestine without perforation or abscess with bleeding  * Acute diverticulitis * Ac on ch anemia due to GI blood loss  CT abdomen confirmed Diverticulitis. Started on IV cipro and Flagyl- and she had improvement. Start on liquid diet. Gi consult ordered, but we don't have any coverage.   Currently Looks Lower Gi bleed due to diverticulitis and Hb >7- in Feb 2017 was 9.  No need for transfusion.   Monitor for now.    HB STABLE, tolerated  Diet.   Cardiology suggested to have Hb 9-10, will transfuse 2 units PRBC today.  * Chest pain   Patient is a history of CAD and stent placement in the past.   Now with anemia and acute infection she is complaining of acute chest pain.   EKG shows bigeminy which is new.   Troponin negative, and stable on tele.   Appreciated cardiology help.    Echo done.   Offered stress test, but pt does not want it.    * Hypokalemia    Magnesium normal     replace orally and IV. Normal now.    * Hypertension   currently stable, continue home medications.   Vitals stable.   All the records are reviewed and case discussed with Care Management/Social Workerr. Management plans discussed  with the patient, family and they are in agreement.  CODE STATUS: DNR  TOTAL TIME TAKING CARE OF THIS PATIENT 35 minutes  POssible/C IN  1-2 D, DEPENDING ON CLINICAL CONDITION. Spoke to Dr. Rockey Situ and Pt's son in room.  Vaughan Basta M.D on 04/25/2016   Between 7am to 6pm - Pager - [phone]  After 6pm go to [email or web address] - password [email or web address] [facility]  Office  [phone]  CC: Primary care physician; Tommi Rumps, MD  Note: This dictation was prepared with Dragon dictation along with smaller phrase technology. Any transcriptional errors that result from this process are unintentional.

## 2016-04-25 NOTE — Care Management Note (Signed)
Case Management Note  Patient Details  Name: Cindy Fitzpatrick MRN: [id number] Date of Birth: 30-Mar-1936  Subjective/Objective:  Case reviewed with Dr Doy Hutching and Dr Anselm Jungling continued stay as inpatient.                  Action/Plan: symptomatic anemia transfuse 2 umits PRBC   Expected Discharge Date:                  Expected Discharge Plan:     In-House Referral:     Discharge planning Services     Post Acute Care Choice:    Choice offered to:     DME Arranged:    DME Agency:     HH Arranged:    HH Agency:     Status of Service:     If discussed at [employer], dates discussed:    Additional Comments:  Ival Bible, RN 04/25/2016, 4:29 PM

## 2016-04-25 NOTE — Progress Notes (Signed)
*  PRELIMINARY RESULTS* Echocardiogram 2D Echocardiogram has been performed.  Cindy Fitzpatrick 04/25/2016, 4:28 PM

## 2016-04-25 NOTE — Clinical Social Work Note (Signed)
Clinical Social Work Assessment  Patient Details  Name: Cindy Fitzpatrick MRN: [id number] Date of Birth: [birth year]  Date of referral:  04/25/16               Reason for consult:  Facility Placement                Permission sought to share information with:  Chartered certified accountant granted to share information::  Yes, Verbal Permission Granted  Name::      Cindy::   Fitzpatrick   Relationship::     Contact Information:     Housing/Transportation Living arrangements for the past 2 months:  [place] of Information:  Patient, Adult Children Patient Interpreter Needed:  None Criminal Activity/Legal Involvement Pertinent to Current Situation/Hospitalization:  No - Comment as needed Significant Relationships:  Adult Children Lives with:  Facility Resident Do you feel safe going back to the place where you live?  Yes Need for family participation in patient care:  Yes (Comment)  Care giving concerns: Patient is from [employer] [facility] [place].    Social Worker assessment / plan:  Holiday representative (CSW) met with patient and her son [name] was at bedside. CSW introduced self and explained role of CSW department. Patient was alert and oriented however very hard of hearing. Per patient she lives at [place] alone. CSW explained that PT is recommending SNF. Patient and son are agreeable for patient to go to the [facility]. Per son he is patient's HPOA and her only adult child that is involved in her life on a regular basis.    FL2 complete and faxed out. CSW will continue to follow and assist as needed.   Employment status:  Retired Nurse, adult PT Recommendations:  [place] / Referral to community resources:  [place]  Patient/Family's Response to care:  Patient is agreeable to going to  [facility]   Patient/Family's Understanding of and Emotional Response to Diagnosis, Current Treatment, and Prognosis:  Patient and son were pleasant and thanked CSW for visit.   Emotional Assessment Appearance:  Appears stated age Attitude/Demeanor/Rapport:    Affect (typically observed):  Accepting, Adaptable, Pleasant Orientation:  Oriented to Self, Oriented to Place, Oriented to  Time, Oriented to Situation Alcohol / Substance use:  Not Applicable Psych involvement (Current and /or in the community):  No (Comment)  Discharge Needs  Concerns to be addressed:  Discharge Planning Concerns Readmission within the last 30 days:  No Current discharge risk:  Dependent with Mobility Barriers to Discharge:  Continued Medical Work up   Loralyn Freshwater, LCSW 04/25/2016, 2:52 PM

## 2016-04-25 NOTE — Progress Notes (Signed)
Pt. Became agitated as blood transfusion completed wanting the transfusion disconnected  and not have the saline flush infused. She also requested the IV be removed, which it was. She has a second access site. Her son was called and it was explained what was going on with pt. He came to facility and is at bedside now. No further complaints at this time.

## 2016-04-25 NOTE — Progress Notes (Addendum)
C-diff negative Dr. Jannifer Franklin updated, isolation discontinued.

## 2016-04-25 NOTE — Progress Notes (Signed)
Patient: Cindy Fitzpatrick / Admit Date: 04/22/2016 / Date of Encounter: 04/25/2016, 1:44 PM   Subjective: Feels well, no complaints Denies shortness of breath or chest pain Has not been ambulating, Eating lunch today, pork tenderloin Family denies any more bloody bowel movements  Review of Systems: Review of Systems  Constitutional: Positive for malaise/fatigue.  Respiratory: Negative.   Cardiovascular: Negative.   Gastrointestinal: Negative.   Musculoskeletal: Negative.   Neurological: Positive for weakness.  Psychiatric/Behavioral: Negative.   All other systems reviewed and are negative.   Objective: Telemetry: Normal sinus rhythm Physical Exam: Blood pressure 112/67, pulse 60, temperature 97.8 F (36.6 C), temperature source Oral, resp. rate 16, height 5\' 3"  (1.6 m), weight 160 lb 6.4 oz (72.757 kg), SpO2 100 %. Body mass index is 28.42 kg/(m^2). General:  no acute distress, thin, pale. Head: Normocephalic, atraumatic, sclera non-icteric, no xanthomas, nares are without discharge. Neck: Negative for carotid bruits. JVD not elevated. Lungs: Clear bilaterally to auscultation without wheezes, rales, or rhonchi. Breathing is unlabored. Heart: RRR with S1 S2. 2+ murmur left sternal border radiating to right sternal border, no rubs, or gallops appreciated. Abdomen: Thin, Soft, non-tender, non-distended with normoactive bowel sounds. No hepatomegaly. No rebound/guarding. No obvious abdominal masses. Msk: Strength and tone appear normal for age. Extremities: No clubbing or cyanosis. No edema. Distal pedal pulses are 2+ and equal bilaterally. Neuro: Alert and oriented X 3. No facial asymmetry. No focal deficit. Moves all extremities spontaneously. Psych: Responds to questions appropriately with a normal affect.Some confusion   Intake/Output Summary (Last 24 hours) at 04/25/16 1344 Last data filed at 04/25/16 1300  Gross per 24 hour  Intake    480 ml  Output      0 ml    Net    480 ml    Inpatient Medications:  . sodium chloride   Intravenous Once  . buPROPion  150 mg Oral Daily  . ciprofloxacin  400 mg Intravenous Q12H  . cloNIDine  0.1 mg Oral BID  . diltiazem  30 mg Oral TID  . losartan  50 mg Oral Daily  . metronidazole  500 mg Intravenous Q6H  . nitroGLYCERIN  0.4 mg Transdermal Daily  . pantoprazole  40 mg Oral BID AC  . potassium chloride  40 mEq Oral BID  . sertraline  200 mg Oral QHS   Infusions:    Labs:  Recent Labs  04/22/16 1928  04/24/16 1132 04/24/16 1703 04/25/16 0135  NA  --   < > 137  --  134*  K  --   < > 2.6* 3.3* 3.5  CL  --   < > 105  --  105  CO2  --   < > 23  --  23  GLUCOSE  --   < > 111*  --  135*  BUN  --   < > 7  --  7  CREATININE  --   < > 0.62  --  0.60  CALCIUM  --   < > 8.3*  --  7.9*  MG 1.9  --  1.7  --   --   < > = values in this interval not displayed. No results for input(s): AST, ALT, ALKPHOS, BILITOT, PROT, ALBUMIN in the last 72 hours.  Recent Labs  04/23/16 0501  04/24/16 1703 04/25/16 0135  WBC 6.6  --   --  9.0  HGB 7.4*  < > 7.9* 7.4*  HCT 22.4*  --   --  23.1*  MCV 77.0*  --   --  77.1*  PLT 386  --   --  373  < > = values in this interval not displayed.  Recent Labs  04/24/16 1124 04/24/16 1703 04/24/16 2033 04/25/16 0135  TROPONINI <0.03 <0.03 <0.03 <0.03   Invalid input(s): POCBNP No results for input(s): HGBA1C in the last 72 hours.   Weights: Filed Weights   04/22/16 1723 04/24/16 1314  Weight: 164 lb 6.4 oz (74.571 kg) 160 lb 6.4 oz (72.757 kg)     Radiology/Studies:  Dg Chest 2 View  04/22/2016  CLINICAL DATA:  Shortness of breath and chest pain EXAM: CHEST  2 VIEW COMPARISON:  06/11/2015 FINDINGS: Cardiac shadow is mildly enlarged. Aortic calcifications are again noted. Elevation of the right hemidiaphragm is again seen. The lungs are clear bilaterally. IMPRESSION: No active cardiopulmonary disease. Electronically Signed   By: Inez Catalina M.D.   On:  04/22/2016 13:15   Ct Abdomen Pelvis W Contrast  04/22/2016  CLINICAL DATA:  Multiple complaints. Rectal bleeding. Abdominal hernia. EXAM: CT ABDOMEN AND PELVIS WITH CONTRAST TECHNIQUE: Multidetector CT imaging of the abdomen and pelvis was performed using the standard protocol following bolus administration of intravenous contrast. CONTRAST:  144mL ISOVUE-300 IOPAMIDOL (ISOVUE-300) INJECTION 61% COMPARISON:  08/08/2013 FINDINGS: Lower chest:  Lung bases are clear.  Mild cardiomegaly. Hepatobiliary: Normal liver. No liver mass. Prior cholecystectomy. Intrahepatic biliary ductal dilatation and mild dilatation of the common bile duct measuring up to 10 mm likely reflecting a post cholecystectomy state. Pancreas: Normal. Spleen: Normal. Adrenals/Urinary Tract: Normal adrenal glands. 4.2 x 3.9 cm hypodense, fluid attenuating exophytic left renal mass most consistent with a cyst. Right upper pole renal scarring. Mildly distended bladder without focal abnormality. Stomach/Bowel: Sigmoid diverticulosis. Severe rectosigmoid bowel wall thickening and surrounding inflammatory changes most concerning for diverticulitis. No bowel dilatation to suggest obstruction. Right paraumbilical abdominal wall hernia containing multiple loops of nondilated small bowel. Vascular/Lymphatic: Abdominal aortic atherosclerosis. No lymphadenopathy. Reproductive: Prior hysterectomy.  No adnexal mass. Other: No fluid collection or hematoma. Musculoskeletal: Degenerative disc disease at L4-5 and L5-S1 with grade 1 anterolisthesis of L4 on L5 and L5 on S1 secondary to bilateral severe facet arthropathy. S-shaped scoliosis of the thoracolumbar spine. Facet arthropathy throughout the visualized thoracolumbar spine. IMPRESSION: 1. Sigmoid diverticulosis. Severe rectosigmoid bowel wall thickening and surrounding inflammatory changes most concerning for diverticulitis. 2. Right paraumbilical abdominal wall hernia containing multiple loops of nondilated  small bowel. 3. Aortic atherosclerosis. Electronically Signed   By: Kathreen Devoid   On: 04/22/2016 15:22     Assessment and Plan  80 y.o. female   1. Chest pain Known coronary artery disease,  Denies having any significant chest pain Cardiac enzymes negative Declining stress test or further workup  Patient does agree to having echocardiogram previous symptoms of chest pain 03/15/2016 when seen by primary care Anemia may exacerbate underlying angina Consider transfusion up to hemoglobin of 10  2) coronary artery disease Previous cardiac catheterization September 1996  95% proximal RCA disease with stent placed now with symptoms in the setting of anemia, unclear if she is having anemia, patient is a poor historian  3) anemia Consider transfusion to hemoglobin of 10 Needs GI workup, colonoscopy/EGD though patient would likely refuse  4) murmur Echocardiogram has been ordered. Previously noted by primary care  5) hyperlipidemia Patient has refused statins in the past Aortic atherosclerosis seen on CT scan  7) diverticulitis On antibiotics, recent GI bleed, now with anemia On contact  precautions   very ill woman with declining health, weight loss, worsening anemia Nutrition is poor May also benefit from repeat visit to hematology for iron infusion after discharge   Total encounter time more than 25 minutes  Greater than 50% was spent in counseling and coordination of care with the patient   Signed, Esmond Plants, MD, Ph.D. [facility] 04/25/2016, 1:44 PM

## 2016-04-25 NOTE — Clinical Social Work Placement (Signed)
   CLINICAL SOCIAL WORK PLACEMENT  NOTE  Date:  04/25/2016  Patient Details  Name: KHAMYAH STOHR MRN: [id number] Date of Birth: [birth year]  Clinical Social Work is seeking post-discharge placement for this patient at the [place] level of care (*CSW will initial, date and re-position this form in  chart as items are completed):  Yes   Patient/family provided with [place] Work Department's list of facilities offering this level of care within the geographic area requested by the patient (or if unable, by the patient's family).  Yes   Patient/family informed of their freedom to choose among providers that offer the needed level of care, that participate in Medicare, Medicaid or managed care program needed by the patient, have an available bed and are willing to accept the patient.  Yes   Patient/family informed of [facility]'s ownership interest in [facility] and [facility], as well as of the fact that they are under no obligation to receive care at these facilities.  PASRR submitted to EDS on 04/23/16     PASRR number received on 04/25/16 ([id number] E expires on 05/24/2016 )     Existing PASRR number confirmed on       FL2 transmitted to all facilities in geographic area requested by pt/family on 04/25/16     FL2 transmitted to all facilities within larger geographic area on       Patient informed that his/her managed care company has contracts with or will negotiate with certain facilities, including the following:        Yes   Patient/family informed of bed offers received.  Patient chooses bed at  [facility] )     Physician recommends and patient chooses bed at      Patient to be transferred to   on  .  Patient to be transferred to facility by       Patient family notified on   of transfer.  Name of family member notified:        PHYSICIAN       Additional Comment:     _______________________________________________ Loralyn Freshwater, LCSW 04/25/2016, 10:05 AM

## 2016-04-26 LAB — BASIC METABOLIC PANEL
Anion gap: 7 (ref 5–15)
BUN: 13 mg/dL (ref 6–20)
CALCIUM: 8.1 mg/dL — AB (ref 8.9–10.3)
CO2: 22 mmol/L (ref 22–32)
CREATININE: 0.62 mg/dL (ref 0.44–1.00)
Chloride: 106 mmol/L (ref 101–111)
GFR calc Af Amer: >60 mL/min (ref >60)
GLUCOSE: 139 mg/dL — AB (ref 65–99)
POTASSIUM: 3.8 mmol/L (ref 3.5–5.1)
Sodium: 135 mmol/L (ref 135–145)

## 2016-04-26 LAB — CBC
HCT: 29.9 % — ABNORMAL LOW (ref 35.0–47.0)
Hemoglobin: 9.8 g/dL — ABNORMAL LOW (ref 12.0–16.0)
MCH: 25.4 pg — AB (ref 26.0–34.0)
MCHC: 32.7 g/dL (ref 32.0–36.0)
MCV: 77.8 fL — AB (ref 80.0–100.0)
PLATELETS: 355 10*3/uL (ref 150–440)
RBC: 3.84 MIL/uL (ref 3.80–5.20)
RDW: 17.4 % — AB (ref 11.5–14.5)
WBC: 12.9 10*3/uL — ABNORMAL HIGH (ref 3.6–11.0)

## 2016-04-26 LAB — ECHOCARDIOGRAM COMPLETE
HEIGHTINCHES: 63 in
Weight: 2566.4 oz

## 2016-04-26 LAB — HEMOGLOBIN: Hemoglobin: 10.6 g/dL — ABNORMAL LOW (ref 12.0–16.0)

## 2016-04-26 MED ORDER — POTASSIUM CHLORIDE ER 10 MEQ PO TBCR: 10.0000 meq | EXTENDED_RELEASE_TABLET | Freq: Every day | ORAL | Status: DC

## 2016-04-26 NOTE — Clinical Social Work Placement (Signed)
   CLINICAL SOCIAL WORK PLACEMENT  NOTE  Date:  04/26/2016  Patient Details  Name: Cindy Fitzpatrick MRN: [id number] Date of Birth: 04-23-1936  Clinical Social Work is seeking post-discharge placement for this patient at the [place] level of care (*CSW will initial, date and re-position this form in  chart as items are completed):  Yes   Patient/family provided with [place] Work Department's list of facilities offering this level of care within the geographic area requested by the patient (or if unable, by the patient's family).  Yes   Patient/family informed of their freedom to choose among providers that offer the needed level of care, that participate in Medicare, Medicaid or managed care program needed by the patient, have an available bed and are willing to accept the patient.  Yes   Patient/family informed of [facility]'s ownership interest in [facility] and [facility], as well as of the fact that they are under no obligation to receive care at these facilities.  PASRR submitted to EDS on 04/23/16     PASRR number received on 04/25/16 ([id number] E expires on 05/24/2016 )     Existing PASRR number confirmed on       FL2 transmitted to all facilities in geographic area requested by pt/family on 04/25/16     FL2 transmitted to all facilities within larger geographic area on       Patient informed that his/her managed care company has contracts with or will negotiate with certain facilities, including the following:        Yes   Patient/family informed of bed offers received.  Patient chooses bed at  [facility] )     Physician recommends and patient chooses bed at      Patient to be transferred to  ([facility]) on 04/26/16.  Patient to be transferred to facility by  (EMS)     Patient family notified on 04/26/16 of transfer.  Name of family member notified:   (Son)     PHYSICIAN       Additional Comment:     _______________________________________________ Baldemar Lenis, LCSW 04/26/2016, 12:14 PM

## 2016-04-26 NOTE — Care Management (Signed)
CSW states patient to dc to [facility] today

## 2016-04-26 NOTE — Progress Notes (Signed)
Clinical Social Worker was informed that patient will be medically ready to discharge to [facility]. Patient and her son are in a agreement with plan. CSW called Maudie Mercury- Admissions Coordinator at [facility] to confirm that patient's bed is ready. Provided patient's room number 348 and number to call for report [phone] . All discharge information faxed to [facility] via [employer]. Rx's and DNR added to discharge packet.   Call to patient's Son left message to inform her patient would discharge to [facility]. RN will call report and patient will discharge to [facility] via EMS  Ernest Pine, MSW, [employer], [place] Clinical Social Worker [phone]

## 2016-04-26 NOTE — Progress Notes (Signed)
Pt to be discharged to [facility] today. Iv and tele removed. Report called to the facility. Transport provided by [facility].

## 2016-04-26 NOTE — Progress Notes (Signed)
PASRR Number received [id number]  Cindy Fitzpatrick, MSW, [place], [place] Social Worker [phone]

## 2016-04-26 NOTE — Discharge Summary (Signed)
[facility] NAME: Cindy Fitzpatrick    MR#:  [id number]  DATE OF BIRTH:  [birth year]  DATE OF ADMISSION:  04/22/2016 ADMITTING PHYSICIAN: Demetrios Loll, MD  DATE OF DISCHARGE: 04/26/2016  PRIMARY CARE PHYSICIAN: Tommi Rumps, MD    ADMISSION DIAGNOSIS:  Diverticulitis of small intestine without perforation or abscess with bleeding [K57.13]  DISCHARGE DIAGNOSIS:  Principal Problem:   Acute diverticulitis Active Problems:   Chronic diarrhea   Weight loss   Iron deficiency anemia due to chronic blood loss   Coronary artery disease involving native coronary artery of native heart with angina pectoris with documented spasm (HCC)   Ischemic chest pain (HCC)   History of coronary artery stent placement   Hyperlipidemia   Diverticulitis of small intestine without perforation or abscess with bleeding    Hypokalemia  SECONDARY DIAGNOSIS:   Past Medical History  Diagnosis Date  . Arthritis   . Depression   . Diverticulitis   . Blood in stool   . Cancer ([place])     skin  . Chicken pox     SHINGLES TWICE  . Hay fever   . GERD (gastroesophageal reflux disease)   . Heart disease   . Heart murmur   . Hyperlipidemia   . Colon polyps     HOSPITAL COURSE:   * Acute diverticulitis- resolving, given oral cipro and flagyl. * Ac on ch anemia due to GI blood loss CT abdomen confirmed Diverticulitis. Started on IV cipro and Flagyl- and she had improvement. Start on liquid diet. Gi consult ordered, but we don't have any coverage.  Currently Looks Lower Gi bleed due to diverticulitis and Hb >7- in Feb 2017 was 9. No need for transfusion.  Monitor for now.  HB STABLE, tolerated Diet.  Cardiology suggested to have Hb 9-10, will transfuse 2 units PRBC today.  * Chest pain  Patient is a history of CAD and stent placement in the past.  Now with anemia and acute infection she is complaining of acute chest pain.  EKG shows  bigeminy which is new.  Troponin negative, and stable on tele.  Appreciated cardiology help.  Echo done.  Offered stress test, but pt does not want it.   * Hypokalemia   Magnesium normal   replace orally and IV. Normal now.   * Hypertension  currently stable, continue home medications.   DISCHARGE CONDITIONS:   Stable.  CONSULTS OBTAINED:  Treatment Team:  Minna Merritts, MD Lollie Sails, MD  DRUG ALLERGIES:   Allergies  Allergen Reactions  . Codeine     Other reaction(s): Headache  . Hydrocodone-Acetaminophen Nausea And Vomiting  . Meperidine     Other reaction(s): Dizziness  . Niacin Er Other (See Comments)    Patient doesn't remember  . Other Nausea Only  . Propoxyphene     Other reaction(s): Dizziness  . Solifenacin Other (See Comments)  . Sulfa Antibiotics Other (See Comments)    Patient doesn' t remember  . Fish-Derived Products Rash    Weakness  . Nabumetone Rash  . Oxycodone-Acetaminophen Rash    Couldn't think or remember anything    DISCHARGE MEDICATIONS:   Current Discharge Medication List    START taking these medications   Details  ciprofloxacin (CIPRO) 500 MG tablet Take 1 tablet (500 mg total) by mouth 2 (two) times daily. Qty: 16 tablet, Refills: 0    ferrous sulfate 325 (65 FE) MG tablet Take 1 tablet (  325 mg total) by mouth 2 (two) times daily with a meal. Qty: 60 tablet, Refills: 2    metroNIDAZOLE (FLAGYL) 250 MG tablet Take 1 tablet (250 mg total) by mouth 3 (three) times daily. Qty: 24 tablet, Refills: 0    potassium chloride (K-DUR) 10 MEQ tablet Take 1 tablet (10 mEq total) by mouth daily. Qty: 30 tablet, Refills: 0      CONTINUE these medications which have NOT CHANGED   Details  acetaminophen (TYLENOL) 650 MG CR tablet Take 650 mg by mouth every 8 (eight) hours as needed for pain.    bisacodyl (DULCOLAX) 5 MG EC tablet Take 5 mg by mouth 2 (two) times daily as needed for moderate constipation.     buPROPion (WELLBUTRIN SR) 150 MG 12 hr tablet Take 1 tablet (150 mg total) by mouth daily. Qty: 30 tablet, Refills: 2    Calcium Carb-Cholecalciferol 600-800 MG-UNIT TABS Take 1 tablet by mouth daily.     cloNIDine (CATAPRES) 0.1 MG tablet TAKE ONE TABLET TWICE A DAY Qty: 60 tablet, Refills: 2    diltiazem (CARDIZEM) 30 MG tablet TAKE 1 TABLET 3 TIMES DAILY Qty: 270 tablet, Refills: 2    losartan-hydrochlorothiazide (HYZAAR) 50-12.5 MG tablet TAKE 1 TABLET EVERY DAY Qty: 90 tablet, Refills: 3    Multiple Vitamins-Minerals (CENTRUM SILVER PO) Take 1 tablet by mouth daily.    nitroGLYCERIN (NITRODUR - DOSED IN MG/24 HR) 0.4 mg/hr patch APPLY ONE PATCH TO SKIN ONCE A DAY LEAVE ON 12 TO 14 HOURS THEN REMOVE FOR 10 TO 12 HOURS BEFORE APPLYING THE NEXT PATCH Qty: 30 patch, Refills: 2    NITROSTAT 0.4 MG SL tablet DISSOLVE ONE TABLET UNDER TONGUE IF NEEDED FOR CHEST PAIN Qty: 25 tablet, Refills: 1    Omega-3 Fatty Acids (FISH OIL) 1200 MG CAPS Take 1 capsule by mouth daily.     omeprazole (PRILOSEC) 20 MG capsule Take 20 mg by mouth 2 (two) times daily before a meal.    Polyethyl Glycol-Propyl Glycol 0.4-0.3 % SOLN Apply 1 drop to eye as needed (dry eyes).     sertraline (ZOLOFT) 100 MG tablet Take 2 tablets by mouth at bedtime.      STOP taking these medications     aspirin EC 81 MG tablet          DISCHARGE INSTRUCTIONS:    Follow with PMD in 2 weeks to check hemoglobin and potassium.  If you experience worsening of your admission symptoms, develop shortness of breath, life threatening emergency, suicidal or homicidal thoughts you must seek medical attention immediately by calling 911 or calling your MD immediately  if symptoms less severe.  You Must read complete instructions/literature along with all the possible adverse reactions/side effects for all the Medicines you take and that have been prescribed to you. Take any new Medicines after you have completely understood  and accept all the possible adverse reactions/side effects.   Please note  You were cared for by a hospitalist during your hospital stay. If you have any questions about your discharge medications or the care you received while you were in the hospital after you are discharged, you can call the unit and asked to speak with the hospitalist on call if the hospitalist that took care of you is not available. Once you are discharged, your primary care physician will handle any further medical issues. Please note that NO REFILLS for any discharge medications will be authorized once you are discharged, as it is imperative that  you return to your primary care physician (or establish a relationship with a primary care physician if you do not have one) for your aftercare needs so that they can reassess your need for medications and monitor your lab values.    Today   CHIEF COMPLAINT:   Chief Complaint  Patient presents with  . Rectal Bleeding    HISTORY OF PRESENT ILLNESS:  Dakodah Garrand  is a 80 y.o. female with a known history of diverticulitis, colon polyp, skin cancer and GERD. The patient presented to the ED with rectal bleeding, abdominal pain, vomitting and diarrhea. She had worseing abdominal pain around the abdominal hernia area. CT of the abdomen showed sigmoid diverticulitis.on-call GI physicians suggested antibiotic treatment.  VITAL SIGNS:  Blood pressure 113/46, pulse 64, temperature 98.4 F (36.9 C), temperature source Oral, resp. rate 16, height 5\' 3"  (1.6 m), weight 72.757 kg (160 lb 6.4 oz), SpO2 97 %.  I/O:   Intake/Output Summary (Last 24 hours) at 04/26/16 1200 Last data filed at 04/26/16 0949  Gross per 24 hour  Intake    871 ml  Output      0 ml  Net    871 ml    PHYSICAL EXAMINATION:   GENERAL: 80 y.o.-year-old patient lying in the bed with no acute distress . EYES: Pupils equal, round, reactive to light and accommodation. No scleral icterus. Extraocular muscles  intact.  HEENT: Head atraumatic, normocephalic. Oropharynx and nasopharynx clear.  NECK: Supple, no jugular venous distention. No thyroid enlargement, no tenderness.  LUNGS: Normal breath sounds bilaterally, no wheezing, rales,rhonchi or crepitation. No use of accessory muscles of respiration.  CARDIOVASCULAR: S1, S2 normal. Systolic murmurs,no rubs, or gallops.  ABDOMEN: Soft, nontender, nondistended. Bowel sounds present. No organomegaly or mass. Abdominal wall hernia. EXTREMITIES: No pedal edema, cyanosis, or clubbing.  NEUROLOGIC: Cranial nerves II through XII are intact. Muscle strength 4/5 in all extremities. Sensation intact. Gait not checked.  PSYCHIATRIC: The patient is alert and oriented x 3.  SKIN: No obvious rash, lesion, or ulcer.   DATA REVIEW:   CBC  Recent Labs Lab 04/26/16 0350  WBC 12.9*  HGB 9.8*  HCT 29.9*  PLT 355    Chemistries   Recent Labs Lab 04/22/16 1250  04/24/16 1132  04/26/16 0350  NA 134*  < > 137  < > 135  K 3.3*  < > 2.6*  < > 3.8  CL 101  < > 105  < > 106  CO2 21*  < > 23  < > 22  GLUCOSE 99  < > 111*  < > 139*  BUN 12  < > 7  < > 13  CREATININE 0.77  < > 0.62  < > 0.62  CALCIUM 8.6*  < > 8.3*  < > 8.1*  MG  --   < > 1.7  --   --   AST 24  --   --   --   --   ALT 18  --   --   --   --   ALKPHOS 77  --   --   --   --   BILITOT 0.3  --   --   --   --   < > = values in this interval not displayed.  Cardiac Enzymes  Recent Labs Lab 04/25/16 0135  TROPONINI <0.03    Microbiology Results  Results for orders placed or performed during the hospital encounter of 04/22/16  Urine culture  Status: Abnormal   Collection Time: 04/22/16  1:21 PM  Result Value Ref Range Status   Specimen Description URINE, RANDOM  Final   Special Requests NONE  Final   Culture (A)  Final    >=100,000 COLONIES/mL AEROCOCCUS URINAE Standardized susceptibility testing for this organism is not available. Performed at [facility]     Report Status 04/25/2016 FINAL  Final  MRSA PCR Screening     Status: None   Collection Time: 04/22/16  5:54 PM  Result Value Ref Range Status   MRSA by PCR NEGATIVE NEGATIVE Final    Comment:        The GeneXpert MRSA Assay (FDA approved for NASAL specimens only), is one component of a comprehensive MRSA colonization surveillance program. It is not intended to diagnose MRSA infection nor to guide or monitor treatment for MRSA infections.   C difficile quick scan w PCR reflex     Status: None   Collection Time: 04/25/16  8:26 PM  Result Value Ref Range Status   C Diff antigen NEGATIVE NEGATIVE Final   C Diff toxin NEGATIVE NEGATIVE Final   C Diff interpretation Negative for C. difficile  Final    RADIOLOGY:  No results found.    Management plans discussed with the patient, family and they are in agreement.  CODE STATUS:     Code Status Orders        Start     Ordered   04/22/16 1720  Do not attempt resuscitation (DNR)   Continuous    Question Answer Comment  In the event of cardiac or respiratory ARREST Do not call a "code blue"   In the event of cardiac or respiratory ARREST Do not perform Intubation, CPR, defibrillation or ACLS   In the event of cardiac or respiratory ARREST Use medication by any route, position, wound care, and other measures to relive pain and suffering. May use oxygen, suction and manual treatment of airway obstruction as needed for comfort.      04/22/16 1719    Code Status History    Date Active Date Inactive Code Status Order ID Comments User Context   This patient has a current code status but no historical code status.    Advance Directive Documentation        Most Recent Value   Type of Advance Directive  Healthcare Power of Attorney   Pre-existing out of facility DNR order (yellow form or pink MOST form)     "MOST" Form in Place?        TOTAL TIME TAKING CARE OF THIS PATIENT: 35 minutes.    Vaughan Basta M.D on  04/26/2016 at 12:00 PM  Between 7am to 6pm - Pager - [phone]  After 6pm go to [email or web address] - password [email or web address] [facility]  Office  [phone]  CC: Primary care physician; Tommi Rumps, MD   Note: This dictation was prepared with Dragon dictation along with smaller phrase technology. Any transcriptional errors that result from this process are unintentional.

## 2016-04-27 LAB — TYPE AND SCREEN
ABO/RH(D): A POS
ANTIBODY SCREEN: NEGATIVE
UNIT DIVISION: 0
Unit division: 0

## 2016-05-05 ENCOUNTER — Ambulatory Visit: Payer: Medicare Other | Admitting: Family Medicine

## 2016-05-10 DIAGNOSIS — R300 Dysuria: Secondary | ICD-10-CM | POA: Diagnosis not present

## 2016-05-10 LAB — URINALYSIS COMPLETE WITH MICROSCOPIC (ARMC ONLY)
Bacteria, UA: NONE SEEN
Bilirubin Urine: NEGATIVE
Glucose, UA: NEGATIVE mg/dL
Hgb urine dipstick: NEGATIVE
Ketones, ur: NEGATIVE mg/dL
Leukocytes, UA: NEGATIVE
Nitrite: NEGATIVE
Protein, ur: NEGATIVE mg/dL
Specific Gravity, Urine: 1.011 (ref 1.005–1.030)
pH: 7 (ref 5.0–8.0)

## 2016-05-11 LAB — URINE CULTURE

## 2016-05-12 ENCOUNTER — Telehealth: Payer: Self-pay

## 2016-05-12 ENCOUNTER — Telehealth: Payer: Self-pay | Admitting: *Deleted

## 2016-05-12 NOTE — Telephone Encounter (Signed)
Patient will need a follow up from rehab with Dr. Olam Idler advise a time and date to place patient. She will also need a potassium chloride refill Pharmacy [facility] pharmacy  Please call son Leory Plowman [phone]

## 2016-05-12 NOTE — Telephone Encounter (Signed)
Patient scheduled 05/21/16.  Will follow up with transitional care management.

## 2016-05-12 NOTE — Telephone Encounter (Signed)
Called patient to follow up with transitional care management.  Stated she was doing ok but would like for me to call back tomorrow.  Will continue to follow on 05/13/16.

## 2016-05-13 ENCOUNTER — Telehealth: Payer: Self-pay

## 2016-05-13 MED ORDER — POTASSIUM CHLORIDE ER 10 MEQ PO TBCR: 10.0000 meq | EXTENDED_RELEASE_TABLET | Freq: Every day | ORAL | Status: DC

## 2016-05-13 NOTE — Telephone Encounter (Signed)
rx sent, thanks.

## 2016-05-13 NOTE — Telephone Encounter (Signed)
Unable to reach patient.  Will continue to follow as appropriate.  Hospital follow up appointment scheduled 05/21/16 with PCP.

## 2016-05-13 NOTE — Telephone Encounter (Signed)
Gordon has requested a follow up on the potassium chloride refill

## 2016-05-13 NOTE — Addendum Note (Signed)
Addended by: Bevelyn Ngo on: 05/13/2016 04:15 PM   Modules accepted: Orders

## 2016-05-14 ENCOUNTER — Telehealth: Payer: Self-pay

## 2016-05-14 NOTE — Telephone Encounter (Signed)
Transition Care Management Follow-up Telephone Call   Date discharged? 05/12/16   How have you been since you were released from the hospital? I AM DOING OKAY, BUT A LITTLE WEAK STILL.  PHYSICAL THERAPY WAS HARD.  I AM TRYING TO GET USED TO EVERYTHING I LEARNED.  I AM STILL USING A WALKER.  NO CHEST PAIN. NO SIGNS OF BLEEDING.  NO SOB.     Do you understand why you were in the hospital? YES, I HAD A GI BLEED AND FROM WHAT I UNDERSTAND DIVERTICULITIS.     Do you understand the discharge instructions? YES, INCREASE ACTIVITY AS TOLERATED, DRINK PLENTY OF FLUIDS, BE CAREFUL OF CERTAIN FOODS I EAT.   Where were you discharged to? Home.   Items Reviewed:  Medications reviewed: YES, TAKING ALL SCHEDULED MEDICATIONS AS PRESCRIBED.  Allergies reviewed: YES, CODEINE, HYDROCODONE, MEPERIDINE, NIACIN Er, PROPOXYPHENE, SOLIFENACIN, SULFA ANTIBIOTICS, FISH DERIVED PRODUCTS, NABUMETONE, OXYCODONE-ACETAMINOPHEN.  Dietary changes reviewed: YES, SOFT DIET.  INCREASE TO REGULAR AVOIDING CERTAIN FOODS, AS TOLERATED.  Referrals reviewed: YES, GI AND PCP.     Functional Questionnaire:   Activities of Daily Living (ADLs):   She states they are independent in the following: Bathing, dressing, meal prep, toileting, grooming, self feeding. States they require assistance with the following: Ambulating Gilford Rile assist).   Any transportation issues/concerns?: NO, son will accompany.   Any patient concerns? NONE.   Confirmed importance and date/time of follow-up visits scheduled YES, appointment scheduled 05/21/16 at 11:30.  Provider Appointment booked with Dr. Caryl Bis (PCP).  Confirmed with patient if condition begins to worsen call PCP or go to the ER.  Patient was given the office number and encouraged to call back with question or concerns.  : YES, PATIENT VERBALIZED APPOINTMENT.

## 2016-05-17 ENCOUNTER — Ambulatory Visit (INDEPENDENT_AMBULATORY_CARE_PROVIDER_SITE_OTHER): Payer: Medicare Other | Admitting: Gastroenterology

## 2016-05-17 ENCOUNTER — Other Ambulatory Visit: Payer: Self-pay

## 2016-05-17 ENCOUNTER — Encounter: Payer: Self-pay | Admitting: Gastroenterology

## 2016-05-17 VITALS — BP 151/59 | HR 80 | Temp 97.6°F | Ht 63.0 in | Wt 144.0 lb

## 2016-05-17 DIAGNOSIS — K5732 Diverticulitis of large intestine without perforation or abscess without bleeding: Secondary | ICD-10-CM | POA: Diagnosis not present

## 2016-05-17 NOTE — Progress Notes (Signed)
Gastroenterology Consultation  Referring Provider:     [name], MD Primary Care Physician:  Tommi Rumps, MD Primary Gastroenterologist:  Dr. Allen Norris     Reason for Consultation:     Diverticulitis        HPI:   Cindy Fitzpatrick is a 80 y.o. y/o female referred for consultation & management of Diverticulitis by Dr. Tommi Rumps, MD.  This patient comes to me after being seen in the emergency room with a finding of diverticulitis. The patient had a imaging procedure done that showed thickening of her colon and. The patient was having rectal bleeding and states that she has been having problems with constipation. The patient was treated for her diverticulitis and states that she is feeling much better. The patient does report having an uncomfortable large abdominal hernia on the right lower abdomen. She denies any further rectal bleeding after starting the antibiotic. There is no report of any fevers chills nausea or vomiting. The patient is alert and awake but seems to have trouble finding the right words that she wants to say. She continuously looks at her son for answers. The patient also reports that she is usually very constipated and is on [facility] a lax. She also was taking MiraLAX but had stopped it.  Past Medical History:  Diagnosis Date  . Arthritis   . Blood in stool   . Cancer ([place])    skin  . Chicken pox    SHINGLES TWICE  . Colon polyps   . Depression   . Diverticulitis   . GERD (gastroesophageal reflux disease)   . Hay fever   . Heart disease   . Heart murmur   . Hyperlipidemia     Past Surgical History:  Procedure Laterality Date  . HERNIA REPAIR    . KNEE SURGERY      Prior to Admission medications   Medication Sig Start Date End Date Taking? Authorizing Provider  acetaminophen (TYLENOL) 650 MG CR tablet Take 650 mg by mouth every 8 (eight) hours as needed for pain.   Yes Historical Provider, MD  bisacodyl (DULCOLAX) 5 MG EC tablet Take 5 mg by mouth  2 (two) times daily as needed for moderate constipation.   Yes Historical Provider, MD  buPROPion (WELLBUTRIN SR) 150 MG 12 hr tablet Take 1 tablet (150 mg total) by mouth daily. 05/01/15  Yes Rubbie Battiest, NP  Calcium Carb-Cholecalciferol 600-800 MG-UNIT TABS Take 1 tablet by mouth daily.    Yes Historical Provider, MD  cloNIDine (CATAPRES) 0.1 MG tablet TAKE ONE TABLET TWICE A DAY 04/19/16  Yes [name], MD  diltiazem (CARDIZEM) 30 MG tablet TAKE 1 TABLET 3 TIMES DAILY 09/23/15  Yes Rubbie Battiest, NP  ferrous sulfate 325 (65 FE) MG tablet Take 1 tablet (325 mg total) by mouth 2 (two) times daily with a meal. 04/23/16  Yes Vaughan Basta, MD  losartan-hydrochlorothiazide (HYZAAR) 50-12.5 MG tablet TAKE 1 TABLET EVERY DAY 10/10/15  Yes Rubbie Battiest, NP  Multiple Vitamins-Minerals (CENTRUM SILVER PO) Take 1 tablet by mouth daily.   Yes Historical Provider, MD  nitroGLYCERIN (NITRODUR - DOSED IN MG/24 HR) 0.4 mg/hr patch APPLY ONE PATCH TO SKIN ONCE A DAY LEAVE ON 12 TO 14 HOURS THEN REMOVE FOR 10 TO 12 HOURS BEFORE APPLYING THE NEXT PATCH 12/18/15  Yes Rubbie Battiest, NP  nitroGLYCERIN (NITRODUR - DOSED IN MG/24 HR) 0.4 mg/hr patch Place onto the skin. 04/01/14  Yes Historical Provider,  MD  NITROSTAT 0.4 MG SL tablet DISSOLVE ONE TABLET UNDER TONGUE IF NEEDED FOR CHEST PAIN 10/24/15  Yes Jackolyn Confer, MD  Omega-3 Fatty Acids (FISH OIL) 1200 MG CAPS Take 1 capsule by mouth daily.    Yes Historical Provider, MD  omeprazole (PRILOSEC) 20 MG capsule Take 20 mg by mouth 2 (two) times daily before a meal.   Yes Historical Provider, MD  Polyethyl Glycol-Propyl Glycol 0.4-0.3 % SOLN Apply 1 drop to eye as needed (dry eyes).    Yes Historical Provider, MD  potassium chloride (K-DUR) 10 MEQ tablet Take 1 tablet (10 mEq total) by mouth daily. 05/13/16  Yes [name], MD  sertraline (ZOLOFT) 100 MG tablet Take 2 tablets by mouth at bedtime. 05/14/15  Yes Historical Provider, MD    Family  History  Problem Relation Age of Onset  . Stroke Mother   . Heart attack Father      Social History  Substance Use Topics  . Smoking status: Never Smoker  . Smokeless tobacco: Never Used  . Alcohol use No    Allergies as of 05/17/2016 - Review Complete 05/17/2016  Allergen Reaction Noted  . Codeine  02/27/2015  . Hydrocodone-acetaminophen Nausea And Vomiting 02/27/2015  . Meperidine  02/27/2015  . Niacin er Other (See Comments) 02/27/2015  . Other Nausea Only 02/27/2015  . Propoxyphene  02/27/2015  . Solifenacin Other (See Comments) 02/27/2015  . Sulfa antibiotics Other (See Comments) 02/27/2015  . Fish-derived products Rash 02/27/2015  . Nabumetone Rash 02/27/2015  . Oxycodone-acetaminophen Rash 02/27/2015    Review of Systems:    All systems reviewed and negative except where noted in HPI.   Physical Exam:  BP (!) 151/59   Pulse 80   Temp 97.6 F (36.4 C) (Oral)   Ht 5\' 3"  (1.6 m)   Wt 144 lb (65.3 kg)   BMI 25.51 kg/m  No LMP recorded. Patient has had a hysterectomy. Psych:  Alert and cooperative. Normal mood and affect. General:   Alert,  Well-developed, well-nourished, pleasant and cooperative in NAD Head:  Normocephalic and atraumatic. Eyes:  Sclera clear, no icterus.   Conjunctiva pink. Ears:  Normal auditory acuity. Nose:  No deformity, discharge, or lesions. Mouth:  No deformity or lesions,oropharynx pink & moist. Neck:  Supple; no masses or thyromegaly. Lungs:  Respirations even and unlabored.  Clear throughout to auscultation.   No wheezes, crackles, or rhonchi. No acute distress. Heart:  Regular rate and rhythm; no murmurs, clicks, rubs, or gallops. Abdomen:  Normal bowel sounds.  No bruits.  Soft, non-tender and non-distended without masses, hepatosplenomegaly. There was a large hernia on the anterior abdominal wall lateral to the umbilicus on the right.  No guarding or rebound tenderness.  Negative Carnett sign.   Rectal:  Deferred.  Msk:   Symmetrical without gross deformities.  Good, equal movement & strength bilaterally. Pulses:  Normal pulses noted. Extremities:  No clubbing or edema.  No cyanosis. Neurologic:  Alert and oriented x3;  grossly normal neurologically. Skin:  Intact without significant lesions or rashes.  No jaundice. Lymph Nodes:  No significant cervical adenopathy. Psych:  Alert and cooperative. Normal mood and affect.  Imaging Studies: Dg Chest 2 View  Result Date: 04/22/2016 CLINICAL DATA:  Shortness of breath and chest pain EXAM: CHEST  2 VIEW COMPARISON:  06/11/2015 FINDINGS: Cardiac shadow is mildly enlarged. Aortic calcifications are again noted. Elevation of the right hemidiaphragm is again seen. The lungs are clear bilaterally. IMPRESSION: No active  cardiopulmonary disease. Electronically Signed   By: Inez Catalina M.D.   On: 04/22/2016 13:15   Ct Abdomen Pelvis W Contrast  Result Date: 04/22/2016 CLINICAL DATA:  Multiple complaints. Rectal bleeding. Abdominal hernia. EXAM: CT ABDOMEN AND PELVIS WITH CONTRAST TECHNIQUE: Multidetector CT imaging of the abdomen and pelvis was performed using the standard protocol following bolus administration of intravenous contrast. CONTRAST:  141mL ISOVUE-300 IOPAMIDOL (ISOVUE-300) INJECTION 61% COMPARISON:  08/08/2013 FINDINGS: Lower chest:  Lung bases are clear.  Mild cardiomegaly. Hepatobiliary: Normal liver. No liver mass. Prior cholecystectomy. Intrahepatic biliary ductal dilatation and mild dilatation of the common bile duct measuring up to 10 mm likely reflecting a post cholecystectomy state. Pancreas: Normal. Spleen: Normal. Adrenals/Urinary Tract: Normal adrenal glands. 4.2 x 3.9 cm hypodense, fluid attenuating exophytic left renal mass most consistent with a cyst. Right upper pole renal scarring. Mildly distended bladder without focal abnormality. Stomach/Bowel: Sigmoid diverticulosis. Severe rectosigmoid bowel wall thickening and surrounding inflammatory changes most  concerning for diverticulitis. No bowel dilatation to suggest obstruction. Right paraumbilical abdominal wall hernia containing multiple loops of nondilated small bowel. Vascular/Lymphatic: Abdominal aortic atherosclerosis. No lymphadenopathy. Reproductive: Prior hysterectomy.  No adnexal mass. Other: No fluid collection or hematoma. Musculoskeletal: Degenerative disc disease at L4-5 and L5-S1 with grade 1 anterolisthesis of L4 on L5 and L5 on S1 secondary to bilateral severe facet arthropathy. S-shaped scoliosis of the thoracolumbar spine. Facet arthropathy throughout the visualized thoracolumbar spine. IMPRESSION: 1. Sigmoid diverticulosis. Severe rectosigmoid bowel wall thickening and surrounding inflammatory changes most concerning for diverticulitis. 2. Right paraumbilical abdominal wall hernia containing multiple loops of nondilated small bowel. 3. Aortic atherosclerosis. Electronically Signed   By: Kathreen Devoid   On: 04/22/2016 15:22    Assessment and Plan:   Cindy Fitzpatrick is a 80 y.o. y/o female who was in the hospital with a finding of diverticulitis with rectal bleeding. The patient has had no further rectal bleeding and states that she is constipated. The patient does not want to go through any procedures such as a colonoscopy. She has no complaints at the present time. The patient is not having any further abdominal pain. The patient will contact me if her rectal bleeding or abdominal pain comes back. She has also been told to titrate her MiraLAX so she does not get diarrhea and so that she is not constipated. The patient has been explained the plan and agrees with it.   Note: This dictation was prepared with Dragon dictation along with smaller phrase technology. Any transcriptional errors that result from this process are unintentional.

## 2016-05-21 ENCOUNTER — Ambulatory Visit (INDEPENDENT_AMBULATORY_CARE_PROVIDER_SITE_OTHER): Payer: Medicare Other | Admitting: Family Medicine

## 2016-05-21 VITALS — BP 108/64 | HR 80 | Temp 98.2°F | Wt 146.2 lb

## 2016-05-21 DIAGNOSIS — D649 Anemia, unspecified: Secondary | ICD-10-CM

## 2016-05-21 DIAGNOSIS — K5713 Diverticulitis of small intestine without perforation or abscess with bleeding: Secondary | ICD-10-CM | POA: Diagnosis not present

## 2016-05-21 DIAGNOSIS — I2510 Atherosclerotic heart disease of native coronary artery without angina pectoris: Secondary | ICD-10-CM | POA: Diagnosis not present

## 2016-05-21 DIAGNOSIS — K429 Umbilical hernia without obstruction or gangrene: Secondary | ICD-10-CM

## 2016-05-21 DIAGNOSIS — E876 Hypokalemia: Secondary | ICD-10-CM | POA: Diagnosis not present

## 2016-05-21 LAB — CBC
HCT: 36.6 % (ref 36.0–46.0)
Hemoglobin: 11.9 g/dL — ABNORMAL LOW (ref 12.0–15.0)
MCHC: 32.4 g/dL (ref 30.0–36.0)
MCV: 80.2 fl (ref 78.0–100.0)
Platelets: 374 10*3/uL (ref 150.0–400.0)
RBC: 4.57 Mil/uL (ref 3.87–5.11)
RDW: 18.3 % — AB (ref 11.5–15.5)
WBC: 11.7 10*3/uL — ABNORMAL HIGH (ref 4.0–10.5)

## 2016-05-21 LAB — BASIC METABOLIC PANEL
BUN: 11 mg/dL (ref 6–23)
CHLORIDE: 101 meq/L (ref 96–112)
CO2: 27 mEq/L (ref 19–32)
Calcium: 10.1 mg/dL (ref 8.4–10.5)
Creatinine, Ser: 0.81 mg/dL (ref 0.40–1.20)
GFR: 72.67 mL/min (ref >60.00)
Glucose, Bld: 109 mg/dL — ABNORMAL HIGH (ref 70–99)
POTASSIUM: 3.8 meq/L (ref 3.5–5.1)
SODIUM: 137 meq/L (ref 135–145)

## 2016-05-21 NOTE — Progress Notes (Signed)
Pre visit review using our clinic review tool, if applicable. No additional management support is needed unless otherwise documented below in the visit note. 

## 2016-05-21 NOTE — Patient Instructions (Signed)
Nice to see you. I'm glad you're doing better. Please monitor for recurrence of your symptoms. Please monitor your hernia and if you are unable to reduce it, or becomes painful, or you go without bowel movements for more than 1-2 days or you go without bowel movements and have discomfort please seek medical attention immediately. We will check your anemia and potassium. Review of recurrence of her abdominal pain or chest pain please seek medical attention.

## 2016-05-23 NOTE — Assessment & Plan Note (Signed)
No recurrence of chest pain since discharge from hospital. She deferred the recommended stress test while she is in the hospital. She'll continue to monitor and if this recurs would recommend evaluation by cardiology. Given return precautions.

## 2016-05-23 NOTE — Progress Notes (Signed)
  Tommi Rumps, MD Phone: [phone]  Cindy Fitzpatrick is a 80 y.o. female who presents today for follow-up.  Patient was hospitalized for diverticulitis. Discharged on ciprofloxacin and Flagyl. Recently finished this. She also had an associated GI bleed with this. Her symptoms have significantly improved. She has no abdominal pain at this time. No vomiting or diarrhea. Her potassium had been low when she was in the hospital and she was placed on potassium supplementation. She also had chest pain though she deferred the recommended stress test. She's not had any recurrent chest pain. No shortness of breath or palpitations. She has followed up with GI as well. Overall doing well. She did spend some time in rehabilitation and feels as though she is getting stronger continually.  Patient does have a right sided umbilical hernia that is fully reducible. Bowel movements daily. No pain in the hernia. Does not ever get stuck. Not interested in surgery.  PMH: nonsmoker.   ROS see history of present illness  Objective  Physical Exam Vitals:   05/21/16 1138  BP: 108/64  Pulse: 80  Temp: 98.2 F (36.8 C)    BP Readings from Last 3 Encounters:  05/21/16 108/64  05/17/16 (!) 151/59  04/26/16 125/79   Wt Readings from Last 3 Encounters:  05/21/16 146 lb 3.2 oz (66.3 kg)  05/17/16 144 lb (65.3 kg)  04/24/16 160 lb 6.4 oz (72.8 kg)    Physical Exam  Constitutional: No distress.  HENT:  Head: Normocephalic and atraumatic.  Cardiovascular: Normal rate, regular rhythm and normal heart sounds.   Pulmonary/Chest: Effort normal and breath sounds normal.  Abdominal: Soft. Bowel sounds are normal. She exhibits no distension. There is no tenderness. There is no rebound and no guarding.  Fully reducible right sided umbilical hernia with no tenderness  Musculoskeletal: She exhibits no edema.  Neurological: She is alert. Gait normal.  Skin: Skin is warm and dry. She is not diaphoretic.      Assessment/Plan: Please see individual problem list.  Diverticulitis of small intestine without perforation or abscess with bleeding Doing significantly better since discharge from the hospital and rehabilitation. No abdominal pain at this time. His completed her antibiotics. We will check a CBC to follow-up on her anemia. We will check a BMP to follow up on her hypokalemia as well. She'll continue to monitor.  CAD in native artery No recurrence of chest pain since discharge from hospital. She deferred the recommended stress test while she is in the hospital. She'll continue to monitor and if this recurs would recommend evaluation by cardiology. Given return precautions.  Umbilical hernia No signs of obstruction or strangulate relation. Does not want to proceed with a surgery. She'll continue to monitor. Given return precautions.   Orders Placed This Encounter  Procedures  . CBC  . Basic Metabolic Panel (BMET)    Tommi Rumps, MD [facility]

## 2016-05-23 NOTE — Assessment & Plan Note (Signed)
Doing significantly better since discharge from the hospital and rehabilitation. No abdominal pain at this time. His completed her antibiotics. We will check a CBC to follow-up on her anemia. We will check a BMP to follow up on her hypokalemia as well. She'll continue to monitor.

## 2016-05-23 NOTE — Assessment & Plan Note (Signed)
No signs of obstruction or strangulate relation. Does not want to proceed with a surgery. She'll continue to monitor. Given return precautions.

## 2016-05-24 ENCOUNTER — Encounter: Payer: Self-pay | Admitting: Family Medicine

## 2016-06-17 ENCOUNTER — Other Ambulatory Visit: Payer: Self-pay | Admitting: Nurse Practitioner

## 2016-06-30 ENCOUNTER — Other Ambulatory Visit: Payer: Self-pay | Admitting: Nurse Practitioner

## 2016-06-30 ENCOUNTER — Other Ambulatory Visit: Payer: Self-pay | Admitting: Family Medicine

## 2016-07-21 ENCOUNTER — Ambulatory Visit: Payer: Medicare Other | Admitting: Nurse Practitioner

## 2016-08-05 ENCOUNTER — Other Ambulatory Visit: Payer: Self-pay | Admitting: Family Medicine

## 2016-08-05 NOTE — Telephone Encounter (Signed)
Please contact the patient to confirm that she is taking these medications. She will need follow-up for further refill of the potassium in the future.

## 2016-08-05 NOTE — Telephone Encounter (Signed)
Can we refill this? 

## 2016-08-06 MED ORDER — POTASSIUM CHLORIDE ER 10 MEQ PO TBCR: 10.0000 meq | EXTENDED_RELEASE_TABLET | Freq: Every day | ORAL | 0 refills | Status: DC

## 2016-08-06 NOTE — Telephone Encounter (Signed)
Spoke with patient and she stated that she is taking both of these medications. She has a follow up appointment for 08/25/16.

## 2016-08-06 NOTE — Telephone Encounter (Signed)
Refill sent to pharmacy.   

## 2016-08-25 ENCOUNTER — Ambulatory Visit (INDEPENDENT_AMBULATORY_CARE_PROVIDER_SITE_OTHER): Payer: Medicare Other | Admitting: Family Medicine

## 2016-08-25 ENCOUNTER — Encounter: Payer: Self-pay | Admitting: Family Medicine

## 2016-08-25 VITALS — BP 98/62 | HR 83 | Temp 97.8°F | Wt 145.8 lb

## 2016-08-25 DIAGNOSIS — G894 Chronic pain syndrome: Secondary | ICD-10-CM

## 2016-08-25 DIAGNOSIS — F3130 Bipolar disorder, current episode depressed, mild or moderate severity, unspecified: Secondary | ICD-10-CM | POA: Diagnosis not present

## 2016-08-25 DIAGNOSIS — I10 Essential (primary) hypertension: Secondary | ICD-10-CM | POA: Diagnosis not present

## 2016-08-25 DIAGNOSIS — Z23 Encounter for immunization: Secondary | ICD-10-CM | POA: Diagnosis not present

## 2016-08-25 DIAGNOSIS — D649 Anemia, unspecified: Secondary | ICD-10-CM | POA: Diagnosis not present

## 2016-08-25 DIAGNOSIS — I2510 Atherosclerotic heart disease of native coronary artery without angina pectoris: Secondary | ICD-10-CM

## 2016-08-25 DIAGNOSIS — R195 Other fecal abnormalities: Secondary | ICD-10-CM

## 2016-08-25 LAB — COMPREHENSIVE METABOLIC PANEL
ALK PHOS: 89 U/L (ref 39–117)
ALT: 14 U/L (ref 0–35)
AST: 18 U/L (ref 0–37)
Albumin: 4.6 g/dL (ref 3.5–5.2)
BILIRUBIN TOTAL: 0.5 mg/dL (ref 0.2–1.2)
BUN: 18 mg/dL (ref 6–23)
CO2: 29 meq/L (ref 19–32)
CREATININE: 0.82 mg/dL (ref 0.40–1.20)
Calcium: 10.2 mg/dL (ref 8.4–10.5)
Chloride: 100 mEq/L (ref 96–112)
GFR: 71.6 mL/min (ref >60.00)
GLUCOSE: 106 mg/dL — AB (ref 70–99)
Potassium: 4.1 mEq/L (ref 3.5–5.1)
SODIUM: 138 meq/L (ref 135–145)
TOTAL PROTEIN: 6.9 g/dL (ref 6.0–8.3)

## 2016-08-25 LAB — CBC
HCT: 39.3 % (ref 36.0–46.0)
HEMOGLOBIN: 13 g/dL (ref 12.0–15.0)
MCHC: 33.1 g/dL (ref 30.0–36.0)
MCV: 86.2 fl (ref 78.0–100.0)
Platelets: 252 10*3/uL (ref 150.0–400.0)
RBC: 4.56 Mil/uL (ref 3.87–5.11)
RDW: 14.9 % (ref 11.5–15.5)
WBC: 9.8 10*3/uL (ref 4.0–10.5)

## 2016-08-25 MED ORDER — BUPROPION HCL ER (SR) 150 MG PO TB12: 150.0000 mg | ORAL_TABLET | Freq: Two times a day (BID) | ORAL | 2 refills | Status: DC

## 2016-08-25 NOTE — Patient Instructions (Addendum)
Nice to see you. We're going to increase her Wellbutrin to twice daily. Please use Tylenol 1000 mg every 8 hours for pain. Please do not take any anti-inflammatories. We are going to get some lab work and call you with the results. If you develop chest pain, shortness of breath, worsening pain, blood in your stools, or any new or changing symptoms please seek medical attention immediately.  Impingement Syndrome, Rotator Cuff, Bursitis With Rehab Impingement syndrome is a condition that involves inflammation of the tendons of the rotator cuff and the subacromial bursa, that causes pain in the shoulder. The rotator cuff consists of four tendons and muscles that control much of the shoulder and upper arm function. The subacromial bursa is a fluid filled sac that helps reduce friction between the rotator cuff and one of the bones of the shoulder (acromion). Impingement syndrome is usually an overuse injury that causes swelling of the bursa (bursitis), swelling of the tendon (tendonitis), and/or a tear of the tendon (strain). Strains are classified into three categories. Grade 1 strains cause pain, but the tendon is not lengthened. Grade 2 strains include a lengthened ligament, due to the ligament being stretched or partially ruptured. With grade 2 strains there is still function, although the function may be decreased. Grade 3 strains include a complete tear of the tendon or muscle, and function is usually impaired. SYMPTOMS   Pain around the shoulder, often at the outer portion of the upper arm.  Pain that gets worse with shoulder function, especially when reaching overhead or lifting.  Sometimes, aching when not using the arm.  Pain that wakes you up at night.  Sometimes, tenderness, swelling, warmth, or redness over the affected area.  Loss of strength.  Limited motion of the shoulder, especially reaching behind the back (to the back pocket or to unhook bra) or across your body.  Crackling  sound (crepitation) when moving the arm.  Biceps tendon pain and inflammation (in the front of the shoulder). Worse when bending the elbow or lifting. CAUSES  Impingement syndrome is often an overuse injury, in which chronic (repetitive) motions cause the tendons or bursa to become inflamed. A strain occurs when a force is paced on the tendon or muscle that is greater than it can withstand. Common mechanisms of injury include: Stress from sudden increase in duration, frequency, or intensity of training.  Direct hit (trauma) to the shoulder.  Aging, erosion of the tendon with normal use.  Bony bump on shoulder (acromial spur). RISK INCREASES WITH:  Contact sports (football, wrestling, boxing).  Throwing sports (baseball, tennis, volleyball).  Weightlifting and bodybuilding.  Heavy labor.  Previous injury to the rotator cuff, including impingement.  Poor shoulder strength and flexibility.  Failure to warm up properly before activity.  Inadequate protective equipment.  Old age.  Bony bump on shoulder (acromial spur). PREVENTION   Warm up and stretch properly before activity.  Allow for adequate recovery between workouts.  Maintain physical fitness:  Strength, flexibility, and endurance.  Cardiovascular fitness.  Learn and use proper exercise technique. PROGNOSIS  If treated properly, impingement syndrome usually goes away within 6 weeks. Sometimes surgery is required.  RELATED COMPLICATIONS   Longer healing time if not properly treated, or if not given enough time to heal.  Recurring symptoms, that result in a chronic condition.  Shoulder stiffness, frozen shoulder, or loss of motion.  Rotator cuff tendon tear.  Recurring symptoms, especially if activity is resumed too soon, with overuse, with a direct blow, or  when using poor technique. TREATMENT  Treatment first involves the use of ice and medicine, to reduce pain and inflammation. The use of strengthening  and stretching exercises may help reduce pain with activity. These exercises may be performed at home or with a therapist. If non-surgical treatment is unsuccessful after more than 6 months, surgery may be advised. After surgery and rehabilitation, activity is usually possible in 3 months.  MEDICATION  If pain medicine is needed, nonsteroidal anti-inflammatory medicines (aspirin and ibuprofen), or other minor pain relievers (acetaminophen), are often advised.  Do not take pain medicine for 7 days before surgery.  Prescription pain relievers may be given, if your caregiver thinks they are needed. Use only as directed and only as much as you need.  Corticosteroid injections may be given by your caregiver. These injections should be reserved for the most serious cases, because they may only be given a certain number of times. HEAT AND COLD  Cold treatment (icing) should be applied for 10 to 15 minutes every 2 to 3 hours for inflammation and pain, and immediately after activity that aggravates your symptoms. Use ice packs or an ice massage.  Heat treatment may be used before performing stretching and strengthening activities prescribed by your caregiver, physical therapist, or athletic trainer. Use a heat pack or a warm water soak. SEEK MEDICAL CARE IF:   Symptoms get worse or do not improve in 4 to 6 weeks, despite treatment.  New, unexplained symptoms develop. (Drugs used in treatment may produce side effects.) EXERCISES  RANGE OF MOTION (ROM) AND STRETCHING EXERCISES - Impingement Syndrome (Rotator Cuff  Tendinitis, Bursitis) These exercises may help you when beginning to rehabilitate your injury. Your symptoms may go away with or without further involvement from your physician, physical therapist or athletic trainer. While completing these exercises, remember:   Restoring tissue flexibility helps normal motion to return to the joints. This allows healthier, less painful movement and  activity.  An effective stretch should be held for at least 30 seconds.  A stretch should never be painful. You should only feel a gentle lengthening or release in the stretched tissue. STRETCH - Flexion, Standing  Stand with good posture. With an underhand grip on your right / left hand, and an overhand grip on the opposite hand, grasp a broomstick or cane so that your hands are a little more than shoulder width apart.  Keeping your right / left elbow straight and shoulder muscles relaxed, push the stick with your opposite hand, to raise your right / left arm in front of your body and then overhead. Raise your arm until you feel a stretch in your right / left shoulder, but before you have increased shoulder pain.  Try to avoid shrugging your right / left shoulder as your arm rises, by keeping your shoulder blade tucked down and toward your mid-back spine. Hold for __________ seconds.  Slowly return to the starting position. Repeat __________ times. Complete this exercise __________ times per day. STRETCH - Abduction, Supine  Lie on your back. With an underhand grip on your right / left hand and an overhand grip on the opposite hand, grasp a broomstick or cane so that your hands are a little more than shoulder width apart.  Keeping your right / left elbow straight and your shoulder muscles relaxed, push the stick with your opposite hand, to raise your right / left arm out to the side of your body and then overhead. Raise your arm until you feel a  stretch in your right / left shoulder, but before you have increased shoulder pain.  Try to avoid shrugging your right / left shoulder as your arm rises, by keeping your shoulder blade tucked down and toward your mid-back spine. Hold for __________ seconds.  Slowly return to the starting position. Repeat __________ times. Complete this exercise __________ times per day. ROM - Flexion, Active-Assisted  Lie on your back. You may bend your knees for  comfort.  Grasp a broomstick or cane so your hands are about shoulder width apart. Your right / left hand should grip the end of the stick, so that your hand is positioned "thumbs-up," as if you were about to shake hands.  Using your healthy arm to lead, raise your right / left arm overhead, until you feel a gentle stretch in your shoulder. Hold for __________ seconds.  Use the stick to assist in returning your right / left arm to its starting position. Repeat __________ times. Complete this exercise __________ times per day.  ROM - Internal Rotation, Supine   Lie on your back on a firm surface. Place your right / left elbow about 60 degrees away from your side. Elevate your elbow with a folded towel, so that the elbow and shoulder are the same height.  Using a broomstick or cane and your strong arm, pull your right / left hand toward your body until you feel a gentle stretch, but no increase in your shoulder pain. Keep your shoulder and elbow in place throughout the exercise.  Hold for __________ seconds. Slowly return to the starting position. Repeat __________ times. Complete this exercise __________ times per day. STRETCH - Internal Rotation  Place your right / left hand behind your back, palm up.  Throw a towel or belt over your opposite shoulder. Grasp the towel with your right / left hand.  While keeping an upright posture, gently pull up on the towel, until you feel a stretch in the front of your right / left shoulder.  Avoid shrugging your right / left shoulder as your arm rises, by keeping your shoulder blade tucked down and toward your mid-back spine.  Hold for __________ seconds. Release the stretch, by lowering your healthy hand. Repeat __________ times. Complete this exercise __________ times per day. ROM - Internal Rotation   Using an underhand grip, grasp a stick behind your back with both hands.  While standing upright with good posture, slide the stick up your back  until you feel a mild stretch in the front of your shoulder.  Hold for __________ seconds. Slowly return to your starting position. Repeat __________ times. Complete this exercise __________ times per day.  STRETCH - Posterior Shoulder Capsule   Stand or sit with good posture. Grasp your right / left elbow and draw it across your chest, keeping it at the same height as your shoulder.  Pull your elbow, so your upper arm comes in closer to your chest. Pull until you feel a gentle stretch in the back of your shoulder.  Hold for __________ seconds. Repeat __________ times. Complete this exercise __________ times per day. STRENGTHENING EXERCISES - Impingement Syndrome (Rotator Cuff Tendinitis, Bursitis) These exercises may help you when beginning to rehabilitate your injury. They may resolve your symptoms with or without further involvement from your physician, physical therapist or athletic trainer. While completing these exercises, remember:  Muscles can gain both the endurance and the strength needed for everyday activities through controlled exercises.  Complete these exercises as instructed by  your physician, physical therapist or athletic trainer. Increase the resistance and repetitions only as guided.  You may experience muscle soreness or fatigue, but the pain or discomfort you are trying to eliminate should never worsen during these exercises. If this pain does get worse, stop and make sure you are following the directions exactly. If the pain is still present after adjustments, discontinue the exercise until you can discuss the trouble with your clinician.  During your recovery, avoid activity or exercises which involve actions that place your injured hand or elbow above your head or behind your back or head. These positions stress the tissues which you are trying to heal. STRENGTH - Scapular Depression and Adduction   With good posture, sit on a firm chair. Support your arms in front of  you, with pillows, arm rests, or on a table top. Have your elbows in line with the sides of your body.  Gently draw your shoulder blades down and toward your mid-back spine. Gradually increase the tension, without tensing the muscles along the top of your shoulders and the back of your neck.  Hold for __________ seconds. Slowly release the tension and relax your muscles completely before starting the next repetition.  After you have practiced this exercise, remove the arm support and complete the exercise in standing as well as sitting position. Repeat __________ times. Complete this exercise __________ times per day.  STRENGTH - Shoulder Abductors, Isometric  With good posture, stand or sit about 4-6 inches from a wall, with your right / left side facing the wall.  Bend your right / left elbow. Gently press your right / left elbow into the wall. Increase the pressure gradually, until you are pressing as hard as you can, without shrugging your shoulder or increasing any shoulder discomfort.  Hold for __________ seconds.  Release the tension slowly. Relax your shoulder muscles completely before you begin the next repetition. Repeat __________ times. Complete this exercise __________ times per day.  STRENGTH - External Rotators, Isometric  Keep your right / left elbow at your side and bend it 90 degrees.  Step into a door frame so that the outside of your right / left wrist can press against the door frame without your upper arm leaving your side.  Gently press your right / left wrist into the door frame, as if you were trying to swing the back of your hand away from your stomach. Gradually increase the tension, until you are pressing as hard as you can, without shrugging your shoulder or increasing any shoulder discomfort.  Hold for __________ seconds.  Release the tension slowly. Relax your shoulder muscles completely before you begin the next repetition. Repeat __________ times.  Complete this exercise __________ times per day.  STRENGTH - Supraspinatus   Stand or sit with good posture. Grasp a __________ weight, or an exercise band or tubing, so that your hand is "thumbs-up," like you are shaking hands.  Slowly lift your right / left arm in a "V" away from your thigh, diagonally into the space between your side and straight ahead. Lift your hand to shoulder height or as far as you can, without increasing any shoulder pain. At first, many people do not lift their hands above shoulder height.  Avoid shrugging your right / left shoulder as your arm rises, by keeping your shoulder blade tucked down and toward your mid-back spine.  Hold for __________ seconds. Control the descent of your hand, as you slowly return to your starting position.  Repeat __________ times. Complete this exercise __________ times per day.  STRENGTH - External Rotators  Secure a rubber exercise band or tubing to a fixed object (table, pole) so that it is at the same height as your right / left elbow when you are standing or sitting on a firm surface.  Stand or sit so that the secured exercise band is at your uninjured side.  Bend your right / left elbow 90 degrees. Place a folded towel or small pillow under your right / left arm, so that your elbow is a few inches away from your side.  Keeping the tension on the exercise band, pull it away from your body, as if pivoting on your elbow. Be sure to keep your body steady, so that the movement is coming only from your rotating shoulder.  Hold for __________ seconds. Release the tension in a controlled manner, as you return to the starting position. Repeat __________ times. Complete this exercise __________ times per day.  STRENGTH - Internal Rotators   Secure a rubber exercise band or tubing to a fixed object (table, pole) so that it is at the same height as your right / left elbow when you are standing or sitting on a firm surface.  Stand or sit so  that the secured exercise band is at your right / left side.  Bend your elbow 90 degrees. Place a folded towel or small pillow under your right / left arm so that your elbow is a few inches away from your side.  Keeping the tension on the exercise band, pull it across your body, toward your stomach. Be sure to keep your body steady, so that the movement is coming only from your rotating shoulder.  Hold for __________ seconds. Release the tension in a controlled manner, as you return to the starting position. Repeat __________ times. Complete this exercise __________ times per day.  STRENGTH - Scapular Protractors, Standing   Stand arms length away from a wall. Place your hands on the wall, keeping your elbows straight.  Begin by dropping your shoulder blades down and toward your mid-back spine.  To strengthen your protractors, keep your shoulder blades down, but slide them forward on your rib cage. It will feel as if you are lifting the back of your rib cage away from the wall. This is a subtle motion and can be challenging to complete. Ask your caregiver for further instruction, if you are not sure you are doing the exercise correctly.  Hold for __________ seconds. Slowly return to the starting position, resting the muscles completely before starting the next repetition. Repeat __________ times. Complete this exercise __________ times per day. STRENGTH - Scapular Protractors, Supine  Lie on your back on a firm surface. Extend your right / left arm straight into the air while holding a __________ weight in your hand.  Keeping your head and back in place, lift your shoulder off the floor.  Hold for __________ seconds. Slowly return to the starting position, and allow your muscles to relax completely before starting the next repetition. Repeat __________ times. Complete this exercise __________ times per day. STRENGTH - Scapular Protractors, Quadruped  Get onto your hands and knees, with  your shoulders directly over your hands (or as close as you can be, comfortably).  Keeping your elbows locked, lift the back of your rib cage up into your shoulder blades, so your mid-back rounds out. Keep your neck muscles relaxed.  Hold this position for __________ seconds. Slowly return  to the starting position and allow your muscles to relax completely before starting the next repetition. Repeat __________ times. Complete this exercise __________ times per day.  STRENGTH - Scapular Retractors  Secure a rubber exercise band or tubing to a fixed object (table, pole), so that it is at the height of your shoulders when you are either standing, or sitting on a firm armless chair.  With a palm down grip, grasp an end of the band in each hand. Straighten your elbows and lift your hands straight in front of you, at shoulder height. Step back, away from the secured end of the band, until it becomes tense.  Squeezing your shoulder blades together, draw your elbows back toward your sides, as you bend them. Keep your upper arms lifted away from your body throughout the exercise.  Hold for __________ seconds. Slowly ease the tension on the band, as you reverse the directions and return to the starting position. Repeat __________ times. Complete this exercise __________ times per day. STRENGTH - Shoulder Extensors   Secure a rubber exercise band or tubing to a fixed object (table, pole) so that it is at the height of your shoulders when you are either standing, or sitting on a firm armless chair.  With a thumbs-up grip, grasp an end of the band in each hand. Straighten your elbows and lift your hands straight in front of you, at shoulder height. Step back, away from the secured end of the band, until it becomes tense.  Squeezing your shoulder blades together, pull your hands down to the sides of your thighs. Do not allow your hands to go behind you.  Hold for __________ seconds. Slowly ease the tension  on the band, as you reverse the directions and return to the starting position. Repeat __________ times. Complete this exercise __________ times per day.  STRENGTH - Scapular Retractors and External Rotators   Secure a rubber exercise band or tubing to a fixed object (table, pole) so that it is at the height as your shoulders, when you are either standing, or sitting on a firm armless chair.  With a palm down grip, grasp an end of the band in each hand. Bend your elbows 90 degrees and lift your elbows to shoulder height, at your sides. Step back, away from the secured end of the band, until it becomes tense.  Squeezing your shoulder blades together, rotate your shoulders so that your upper arms and elbows remain stationary, but your fists travel upward to head height.  Hold for __________ seconds. Slowly ease the tension on the band, as you reverse the directions and return to the starting position. Repeat __________ times. Complete this exercise __________ times per day.  STRENGTH - Scapular Retractors and External Rotators, Rowing   Secure a rubber exercise band or tubing to a fixed object (table, pole) so that it is at the height of your shoulders, when you are either standing, or sitting on a firm armless chair.  With a palm down grip, grasp an end of the band in each hand. Straighten your elbows and lift your hands straight in front of you, at shoulder height. Step back, away from the secured end of the band, until it becomes tense.  Step 1: Squeeze your shoulder blades together. Bending your elbows, draw your hands to your chest, as if you are rowing a boat. At the end of this motion, your hands and elbow should be at shoulder height and your elbows should be out to  your sides.  Step 2: Rotate your shoulders, to raise your hands above your head. Your forearms should be vertical and your upper arms should be horizontal.  Hold for __________ seconds. Slowly ease the tension on the band, as  you reverse the directions and return to the starting position. Repeat __________ times. Complete this exercise __________ times per day.  STRENGTH - Scapular Depressors  Find a sturdy chair without wheels, such as a dining room chair.  Keeping your feet on the floor, and your hands on the chair arms, lift your bottom up from the seat, and lock your elbows.  Keeping your elbows straight, allow gravity to pull your body weight down. Your shoulders will rise toward your ears.  Raise your body against gravity by drawing your shoulder blades down your back, shortening the distance between your shoulders and ears. Although your feet should always maintain contact with the floor, your feet should progressively support less body weight, as you get stronger.  Hold for __________ seconds. In a controlled and slow manner, lower your body weight to begin the next repetition. Repeat __________ times. Complete this exercise __________ times per day.    This information is not intended to replace advice given to you by your health care provider. Make sure you discuss any questions you have with your health care provider.   Document Released: 10/11/2005 Document Revised: 11/01/2014 Document Reviewed: 01/23/2009 [employer] Interactive Patient Education 2016 [employer].   Trochanteric Bursitis You have hip pain due to trochanteric bursitis. Bursitis means that the sack near the outside of the hip is filled with fluid and inflamed. This sack is made up of protective soft tissue. The pain from trochanteric bursitis can be severe and keep you from sleep. It can radiate to the buttocks or down the outside of the thigh to the knee. The pain is almost always worse when rising from the seated or lying position and with walking. Pain can improve after you take a few steps. It happens more often in people with hip joint and lumbar spine problems, such as arthritis or previous surgery. Very rarely the trochanteric bursa  can become infected, and antibiotics and/or surgery may be needed. Treatment often includes an injection of local anesthetic mixed with cortisone medicine. This medicine is injected into the area where it is most tender over the hip. Repeat injections may be necessary if the response to treatment is slow. You can apply ice packs over the tender area for 30 minutes every 2 hours for the next few days. Anti-inflammatory and/or narcotic pain medicine may also be helpful. Limit your activity for the next few days if the pain continues. See your caregiver in 5-10 days if you are not greatly improved.  SEEK IMMEDIATE MEDICAL CARE IF:  You develop severe pain, fever, or increased redness.  You have pain that radiates below the knee. EXERCISES STRETCHING EXERCISES - Trochanteric Bursitis  These exercises may help you when beginning to rehabilitate your injury. Your symptoms may resolve with or without further involvement from your physician, physical therapist, or athletic trainer. While completing these exercises, remember:   Restoring tissue flexibility helps normal motion to return to the joints. This allows healthier, less painful movement and activity.  An effective stretch should be held for at least 30 seconds.  A stretch should never be painful. You should only feel a gentle lengthening or release in the stretched tissue. STRETCH - Iliotibial Band  On the floor or bed, lie on your side so  your injured leg is on top. Bend your knee and grab your ankle.  Slowly bring your knee back so that your thigh is in line with your trunk. Keep your heel at your buttocks and gently arch your back so your head, shoulders and hips line up.  Slowly lower your leg so that your knee approaches the floor/bed until you feel a gentle stretch on the outside of your thigh. If you do not feel a stretch and your knee will not fall farther, place the heel of your opposite foot on top of your knee and pull your thigh down  farther.  Hold this stretch for __________ seconds.  Repeat __________ times. Complete this exercise __________ times per day. STRETCH - Hamstrings, Supine   Lie on your back. Loop a belt or towel over the ball of your foot as shown.  Straighten your knee and slowly pull on the belt to raise your injured leg. Do not allow the knee to bend. Keep your opposite leg flat on the floor.  Raise the leg until you feel a gentle stretch behind your knee or thigh. Hold this position for __________ seconds.  Repeat __________ times. Complete this stretch __________ times per day. STRETCH - Quadriceps, Prone   Lie on your stomach on a firm surface, such as a bed or padded floor.  Bend your knee and grasp your ankle. If you are unable to reach your ankle or pant leg, use a belt around your foot to lengthen your reach.  Gently pull your heel toward your buttocks. Your knee should not slide out to the side. You should feel a stretch in the front of your thigh and/or knee.  Hold this position for __________ seconds.  Repeat __________ times. Complete this stretch __________ times per day. STRETCHING - Hip Flexors, Lunge Half kneel with your knee on the floor and your opposite knee bent and directly over your ankle.  Keep good posture with your head over your shoulders. Tighten your buttocks to point your tailbone downward; this will prevent your back from arching too much.  You should feel a gentle stretch in the front of your thigh and/or hip. If you do not feel any resistance, slightly slide your opposite foot forward and then slowly lunge forward so your knee once again lines up over your ankle. Be sure your tailbone remains pointed downward.  Hold this stretch for __________ seconds.  Repeat __________ times. Complete this stretch __________ times per day. STRETCH - Adductors, Lunge  While standing, spread your legs.  Lean away from your injured leg by bending your opposite knee. You may  rest your hands on your thigh for balance.  You should feel a stretch in your inner thigh. Hold for __________ seconds.  Repeat __________ times. Complete this exercise __________ times per day.   This information is not intended to replace advice given to you by your health care provider. Make sure you discuss any questions you have with your health care provider.   Document Released: 11/18/2004 Document Revised: 02/25/2015 Document Reviewed: 01/23/2009 Elsevier Interactive Patient Education [employer].

## 2016-08-25 NOTE — Progress Notes (Signed)
Pre visit review using our clinic review tool, if applicable. No additional management support is needed unless otherwise documented below in the visit note. 

## 2016-08-26 DIAGNOSIS — R195 Other fecal abnormalities: Secondary | ICD-10-CM | POA: Insufficient documentation

## 2016-08-26 NOTE — Assessment & Plan Note (Signed)
Patient reports stools ranging from brown to green to black recently. She does have a history of a GI bleed. She notes no abdominal discomfort. She is taking iron. No stool in the rectal vault today. No blood on guaiac testing. We will check a CBC. We will send her home with stool cards to complete.

## 2016-08-26 NOTE — Assessment & Plan Note (Signed)
Patient with continued issues with chronic pain. Pain is mostly in her hips and shoulders though does have scattered pains elsewhere. Hip exam most consistent with trochanteric bursitis. Shoulder exam most consistent with rotator cuff impingement or tendinitis. I discussed possible treatment regimens including injections. Advised that given her cardiac issues NSAIDs are not a good option. She has been taking Tylenol with good benefit and is not maxing out the dose. We will provide her with exercises to do for both these areas. She will ice her hips. She can take Tylenol 1000 mg 3 times a day for pain. If not improving would consider referral for bursa injection and shoulder injection.

## 2016-08-26 NOTE — Progress Notes (Signed)
Tommi Rumps, MD Phone: [phone]  Cindy Fitzpatrick is a 80 y.o. female who presents today for follow-up.  Patient notes daily issues with anxiety. She notes some depression as well. No SI. Currently on Zoloft. Also takes Wellbutrin once daily. Was on clonidine in the past for panic attacks. Has continued on this. She notes no depression. She does not see a therapist. She is unsure what she wants to do for this.  HYPERTENSION  Disease Monitoring  Home BP Monitoring not checking Chest pain- no    Dyspnea- yes, see below Medications  Compliance-  taking losartan, HCTZ, diltiazem, clonidine.  Edema- no  Patient notes some chronic intermittent dyspnea mostly when she is upset or nervous though can occur with exertion. She notes no orthopnea or PND. She notes no cough. Notes it's been going on for years. Started to worsen a little bit when she was in rehabilitation following her most recent hospitalization. She was advised that she needed a stress test when she was hospitalized though she declined this. I discussed options for working this up with her today though she declined wanting to work this up any further.  Chronic pain: Patient notes chronic pain issues in her bilateral hips and shoulders. Notes her right hip is worse than her left. Notes it hurts when she lays on it at night. It does not radiate. She feels as though she has to hips. The first place she points to is her iliac crest. The second place she points to is her greater trochanter. Notes her shoulders just hurt. Hurt with some movements. Notes all of this has been going on for several months. She's been taking Tylenol that is somewhat beneficial. She is unsure how much she can take. She notes no specific injuries in these areas.  Patient additionally notes she's had some dark stools recently. They range from brown to black. Also green. She is currently taking iron. She does have a history of GI bleed. She's not noticed any gross  blood in her stools. No abdominal pain.   PMH: nonsmoker.   ROS see history of present illness  Objective  Physical Exam Vitals:   08/25/16 1330  BP: 98/62  Pulse: 83  Temp: 97.8 F (36.6 C)    BP Readings from Last 3 Encounters:  08/25/16 98/62  05/21/16 108/64  05/17/16 (!) 151/59   Wt Readings from Last 3 Encounters:  08/25/16 145 lb 12.8 oz (66.1 kg)  05/21/16 146 lb 3.2 oz (66.3 kg)  05/17/16 144 lb (65.3 kg)    Physical Exam  Constitutional: No distress.  Cardiovascular: Normal rate, regular rhythm and normal heart sounds.   Pulmonary/Chest: Effort normal and breath sounds normal.  Abdominal: Soft. Bowel sounds are normal. She exhibits no distension. There is no tenderness. There is no rebound and no guarding.  Genitourinary:  Genitourinary Comments: Hemorrhoids noted on rectal exam, normal rectal tone, no stool in the rectal vault, negative guaiac  Musculoskeletal: She exhibits no edema.  Tenderness of bilateral greater trochanters, no tenderness in the IT band distribution, no discomfort on internal or external range of motion, bilateral shoulders with no tenderness though there is discomfort on internal and external range of motion and abduction bilaterally passively, positive empty can bilaterally  Neurological: She is alert. Gait normal.  Skin: Skin is warm and dry. She is not diaphoretic.     Assessment/Plan: Please see individual problem list.  Bipolar affective disorder ([place]) Patient's symptoms mostly consistent of anxiety at this point. Discussed options  for treatment including seeing a psychiatrist, therapist, or medication. Benzodiazepines are not a good medication for her to be on given falls in the past with these medicines. Offered referral to a therapist though she declined. We are going to increase Wellbutrin to twice a day dosing to see if this will be beneficial. She'll continue her Zoloft. She's given return precautions.  Benign essential  HTN At goal. Continue current medications.  CAD in native artery Patient with no recurrence of chest pain though she has had some exertional dyspnea and dyspnea at other times. I had a long discussion with her regarding this and the potential workup. She does not want to pursue any sort of workup of this at this time. She would prefer to monitor. She does not want to see cardiology. She's given return precautions.  Dark stools Patient reports stools ranging from brown to green to black recently. She does have a history of a GI bleed. She notes no abdominal discomfort. She is taking iron. No stool in the rectal vault today. No blood on guaiac testing. We will check a CBC. We will send her home with stool cards to complete.  Chronic pain syndrome Patient with continued issues with chronic pain. Pain is mostly in her hips and shoulders though does have scattered pains elsewhere. Hip exam most consistent with trochanteric bursitis. Shoulder exam most consistent with rotator cuff impingement or tendinitis. I discussed possible treatment regimens including injections. Advised that given her cardiac issues NSAIDs are not a good option. She has been taking Tylenol with good benefit and is not maxing out the dose. We will provide her with exercises to do for both these areas. She will ice her hips. She can take Tylenol 1000 mg 3 times a day for pain. If not improving would consider referral for bursa injection and shoulder injection.   Orders Placed This Encounter  Procedures  . Flu vaccine HIGH DOSE PF  . CBC  . Comp Met (CMET)    Meds ordered this encounter  Medications  . buPROPion (WELLBUTRIN SR) 150 MG 12 hr tablet    Sig: Take 1 tablet (150 mg total) by mouth 2 (two) times daily.    Dispense:  60 tablet    Refill:  2    Tommi Rumps, MD [place]

## 2016-08-26 NOTE — Assessment & Plan Note (Signed)
Patient with no recurrence of chest pain though she has had some exertional dyspnea and dyspnea at other times. I had a long discussion with her regarding this and the potential workup. She does not want to pursue any sort of workup of this at this time. She would prefer to monitor. She does not want to see cardiology. She's given return precautions.

## 2016-08-26 NOTE — Assessment & Plan Note (Signed)
At goal. Continue current medications. 

## 2016-08-26 NOTE — Assessment & Plan Note (Signed)
Patient's symptoms mostly consistent of anxiety at this point. Discussed options for treatment including seeing a psychiatrist, therapist, or medication. Benzodiazepines are not a good medication for her to be on given falls in the past with these medicines. Offered referral to a therapist though she declined. We are going to increase Wellbutrin to twice a day dosing to see if this will be beneficial. She'll continue her Zoloft. She's given return precautions.

## 2016-08-29 IMAGING — CR DG CHEST 2V
2 series · 2 of 2 positions shown · non-contrast
Comparison: 06/11/2015

CLINICAL DATA: Shortness of breath and chest pain

EXAM:
CHEST  2 VIEW

[chest lat]
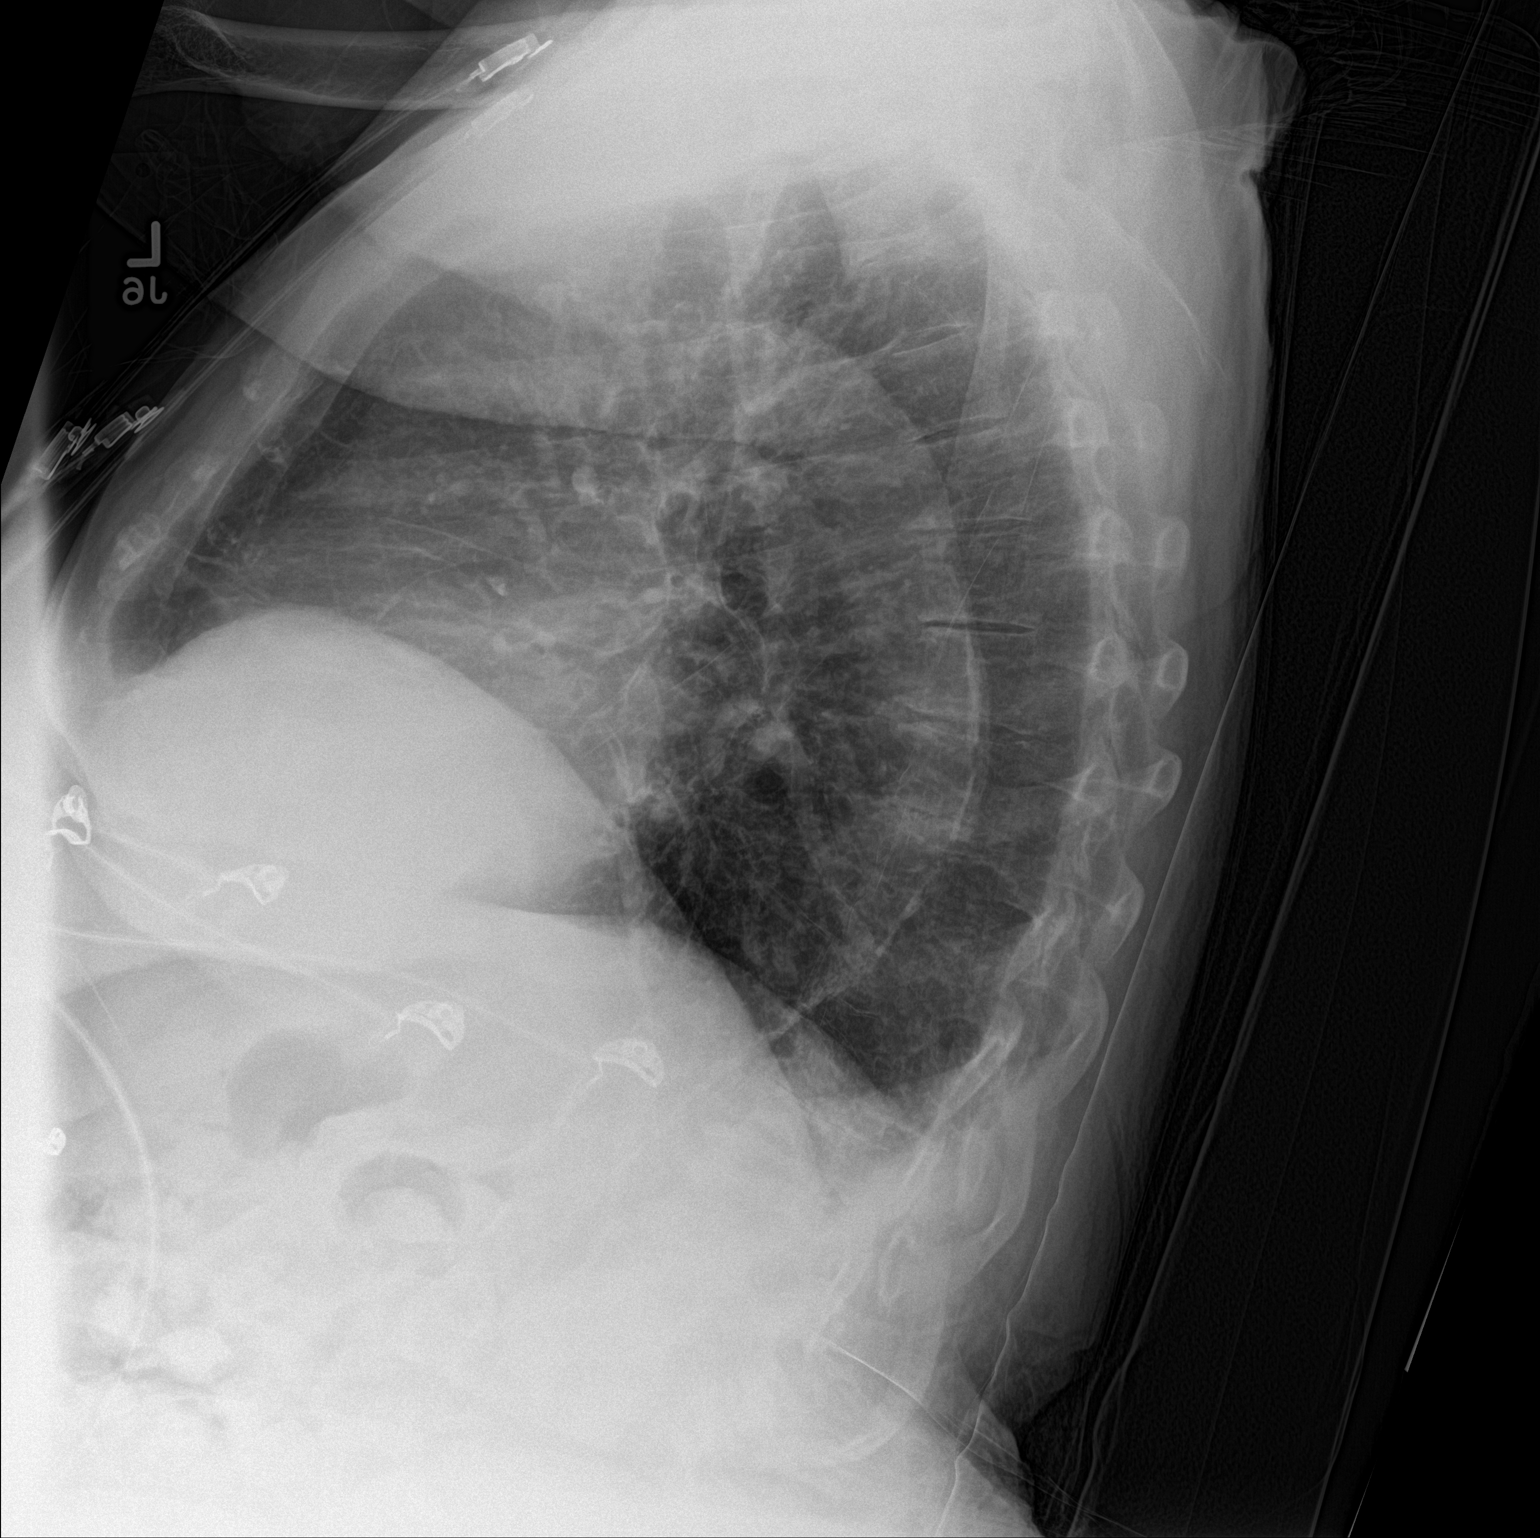

[chest ap]
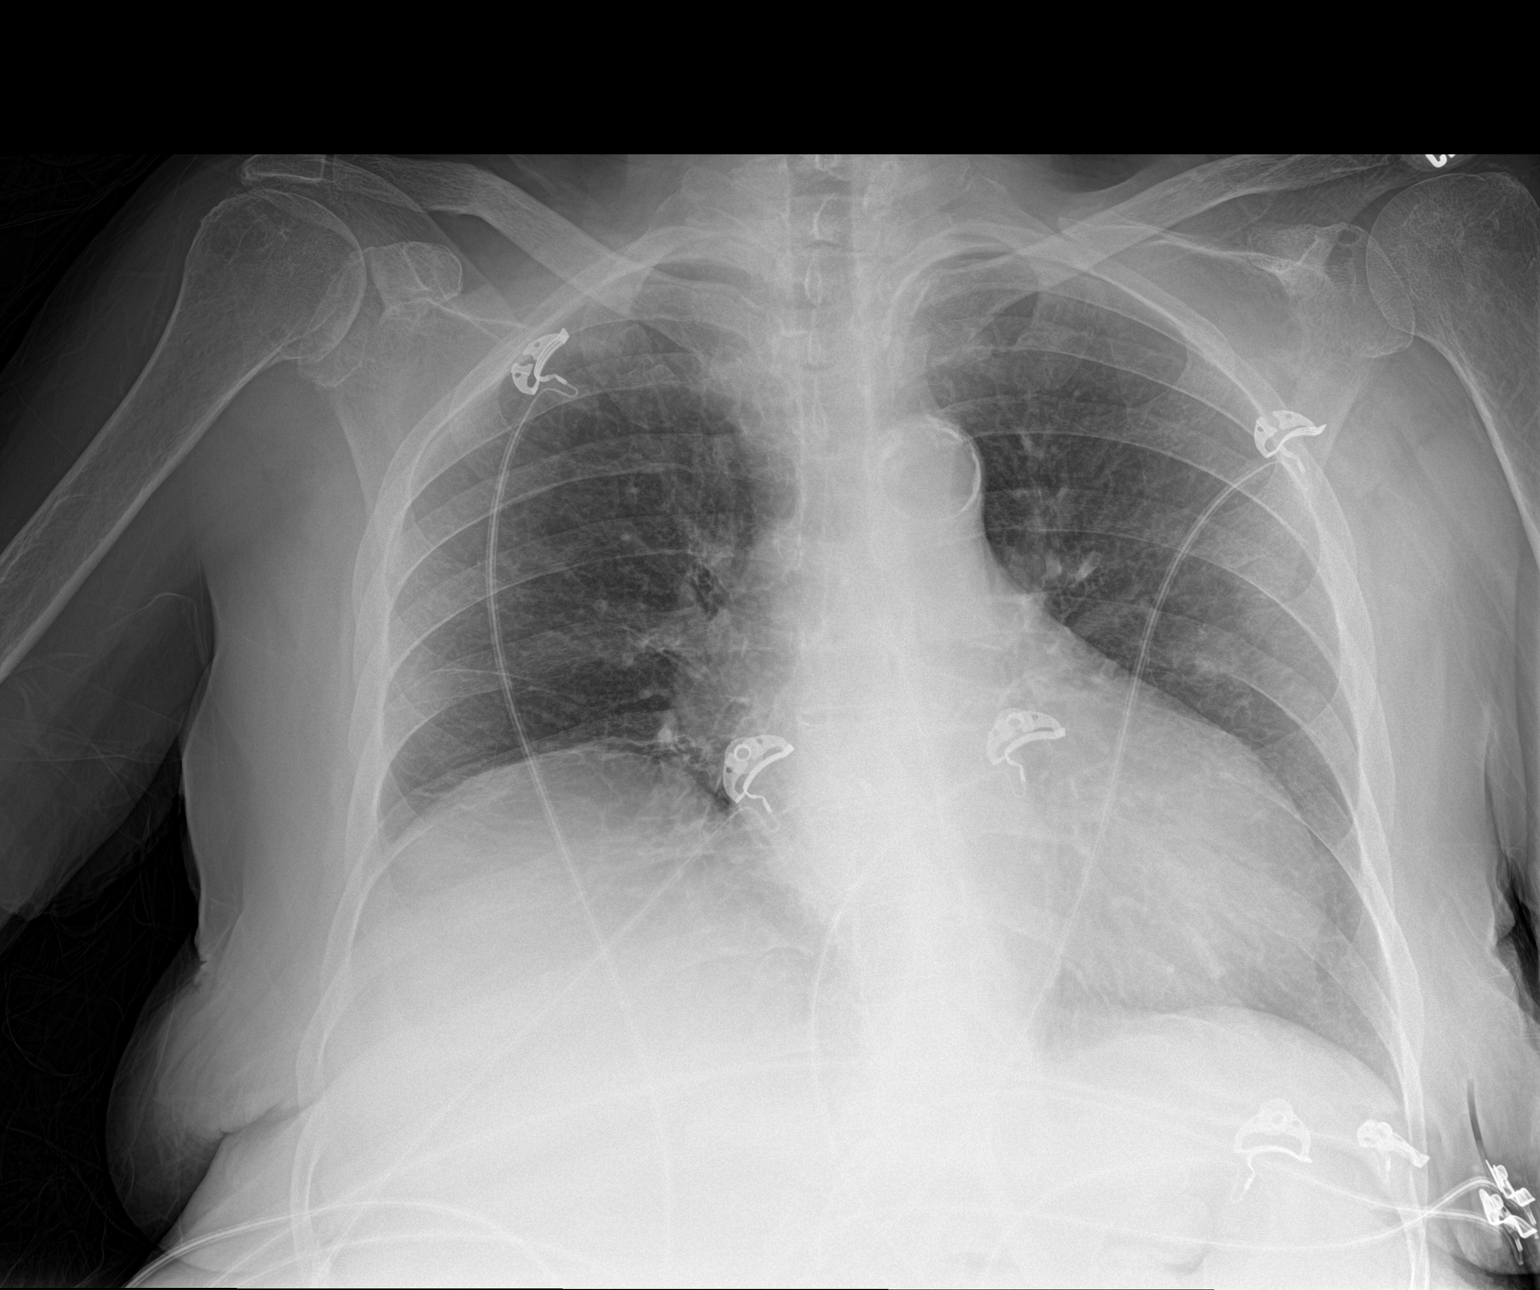

[2 of 2 positions shown; findings below may reference images not displayed]

FINDINGS: Cardiac shadow is mildly enlarged. Aortic calcifications are again
noted. Elevation of the right hemidiaphragm is again seen. The lungs
are clear bilaterally.
IMPRESSION: No active cardiopulmonary disease.

## 2016-08-30 ENCOUNTER — Other Ambulatory Visit: Payer: Self-pay | Admitting: Family Medicine

## 2016-08-30 NOTE — Telephone Encounter (Signed)
Please advise 

## 2016-08-30 NOTE — Telephone Encounter (Signed)
Refills given.

## 2016-08-30 NOTE — Telephone Encounter (Signed)
Please confirm with the patient that she is taking the nitroglycerin patch and the potassium. Thanks.

## 2016-08-30 NOTE — Telephone Encounter (Signed)
Patient is using both medications

## 2016-09-03 ENCOUNTER — Other Ambulatory Visit: Payer: Self-pay | Admitting: Family Medicine

## 2016-09-03 DIAGNOSIS — Z8719 Personal history of other diseases of the digestive system: Secondary | ICD-10-CM

## 2016-09-07 ENCOUNTER — Other Ambulatory Visit (INDEPENDENT_AMBULATORY_CARE_PROVIDER_SITE_OTHER): Payer: Medicare Other

## 2016-09-07 DIAGNOSIS — Z8719 Personal history of other diseases of the digestive system: Secondary | ICD-10-CM | POA: Diagnosis not present

## 2016-09-07 LAB — FECAL OCCULT BLOOD, IMMUNOCHEMICAL: FECAL OCCULT BLD: POSITIVE — AB

## 2016-10-30 ENCOUNTER — Other Ambulatory Visit: Payer: Self-pay | Admitting: Family Medicine

## 2016-10-30 NOTE — Telephone Encounter (Signed)
Last filled by historical provider.  

## 2016-11-01 ENCOUNTER — Other Ambulatory Visit: Payer: Self-pay | Admitting: Nurse Practitioner

## 2016-11-01 NOTE — Telephone Encounter (Signed)
Sent to pharmacy 

## 2016-11-29 ENCOUNTER — Ambulatory Visit (INDEPENDENT_AMBULATORY_CARE_PROVIDER_SITE_OTHER): Payer: Medicare Other | Admitting: Family Medicine

## 2016-11-29 ENCOUNTER — Encounter: Payer: Self-pay | Admitting: Family Medicine

## 2016-11-29 ENCOUNTER — Other Ambulatory Visit: Payer: Self-pay | Admitting: Family Medicine

## 2016-11-29 VITALS — BP 112/80 | HR 87 | Temp 97.8°F | Wt 144.2 lb

## 2016-11-29 DIAGNOSIS — R195 Other fecal abnormalities: Secondary | ICD-10-CM

## 2016-11-29 DIAGNOSIS — R079 Chest pain, unspecified: Secondary | ICD-10-CM

## 2016-11-29 DIAGNOSIS — I2510 Atherosclerotic heart disease of native coronary artery without angina pectoris: Secondary | ICD-10-CM

## 2016-11-29 DIAGNOSIS — Y92009 Unspecified place in unspecified non-institutional (private) residence as the place of occurrence of the external cause: Secondary | ICD-10-CM

## 2016-11-29 DIAGNOSIS — W19XXXA Unspecified fall, initial encounter: Secondary | ICD-10-CM | POA: Diagnosis not present

## 2016-11-29 DIAGNOSIS — Y92099 Unspecified place in other non-institutional residence as the place of occurrence of the external cause: Secondary | ICD-10-CM

## 2016-11-29 LAB — CBC
HCT: 41.7 % (ref 36.0–46.0)
Hemoglobin: 14 g/dL (ref 12.0–15.0)
MCHC: 33.5 g/dL (ref 30.0–36.0)
MCV: 89.8 fl (ref 78.0–100.0)
Platelets: 265 10*3/uL (ref 150.0–400.0)
RBC: 4.65 Mil/uL (ref 3.87–5.11)
RDW: 13.5 % (ref 11.5–15.5)
WBC: 9.3 10*3/uL (ref 4.0–10.5)

## 2016-11-29 LAB — COMPREHENSIVE METABOLIC PANEL
ALBUMIN: 4.7 g/dL (ref 3.5–5.2)
ALT: 14 U/L (ref 0–35)
AST: 20 U/L (ref 0–37)
Alkaline Phosphatase: 85 U/L (ref 39–117)
BUN: 17 mg/dL (ref 6–23)
CO2: 29 mEq/L (ref 19–32)
CREATININE: 0.87 mg/dL (ref 0.40–1.20)
Calcium: 10.4 mg/dL (ref 8.4–10.5)
Chloride: 101 mEq/L (ref 96–112)
GFR: 66.83 mL/min (ref >60.00)
GLUCOSE: 107 mg/dL — AB (ref 70–99)
Potassium: 4.4 mEq/L (ref 3.5–5.1)
SODIUM: 138 meq/L (ref 135–145)
TOTAL PROTEIN: 7.3 g/dL (ref 6.0–8.3)
Total Bilirubin: 0.4 mg/dL (ref 0.2–1.2)

## 2016-11-29 MED ORDER — NITROGLYCERIN 0.4 MG/HR TD PT24: MEDICATED_PATCH | TRANSDERMAL | 3 refills | Status: DC

## 2016-11-29 MED ORDER — NITROGLYCERIN 0.4 MG SL SUBL: SUBLINGUAL_TABLET | SUBLINGUAL | 1 refills | Status: DC

## 2016-11-29 NOTE — Progress Notes (Signed)
Tommi Rumps, MD Phone: [phone]  Cindy Fitzpatrick is a 81 y.o. female who presents today for follow-up.  Chest pain: Patient notes she had an episode of chest pain this morning starting at 6 AM. Last for about 30 minutes. Was centralized chest pressure. Did not radiate. There was no shortness of breath. There was no diaphoresis. It occurred while she was sleeping. She took nitroglycerin that was expired and had no improvement. She then took a nitroglycerin that was not expired and had resolution in her pain. Has not had any recurrence since then. No shortness of breath with this. Asymptomatic at this time.  Patient notes continued intermittent dark stools. She had a positive FOBT at her last visit and declined GI evaluation. She notes her stools are more brown now than they were. Alternate with green and dark stools. No gross blood in her stool.  Patient notes she fell last week when she had her toe on her walker. Her walker went forward and she fell backwards onto her right back. Did knock the air out of her lungs. She notes no breathing issues. She did hit the side of her head lightly though there was no loss of consciousness. She's had no headaches. She notes some bruising and the pain has been improving.  PMH: nonsmoker.   ROS see history of present illness  Objective  Physical Exam Vitals:   11/29/16 1403  BP: 112/80  Pulse: 87  Temp: 97.8 F (36.6 C)    BP Readings from Last 3 Encounters:  11/29/16 112/80  08/25/16 98/62  05/21/16 108/64   Wt Readings from Last 3 Encounters:  11/29/16 144 lb 3.2 oz (65.4 kg)  08/25/16 145 lb 12.8 oz (66.1 kg)  05/21/16 146 lb 3.2 oz (66.3 kg)    Physical Exam  Constitutional: No distress.  Cardiovascular: Normal rate, regular rhythm and normal heart sounds.   Pulmonary/Chest: Effort normal and breath sounds normal.  Musculoskeletal:  No midline spine tenderness, no midline spine step-off, no muscular back tenderness, there is  mild tenderness over the right flank just above the iliac crest with some mild bruising in this area  Neurological: She is alert.  CN 2-12 intact, 5/5 strength in bilateral biceps, triceps, grip, quads, hamstrings, plantar and dorsiflexion, sensation to light touch intact in bilateral UE and LE  Skin: Skin is warm and dry. She is not diaphoretic.   EKG: Sinus rhythm, rate 79, right bundle branch block, no change from prior EKG  Assessment/Plan: Please see individual problem list.  CAD in native artery Patient with anginal symptoms earlier today. EKG completed which is similar to her prior EKGs with no apparent ischemic changes. Asymptomatic currently. Resolved with 1 nitroglycerin that was of a good date. Had a long discussion with the patient regarding evaluation for this by cardiology and the potential for adverse outcomes if she were not to be evaluated and have intervention if needed. She noted she understands this and did not want evaluation and wanted to continue as needed nitroglycerin with no intervention. Nitroglycerin will be refilled. I discussed if she has to take 3 tablets of this with no improvement she should be evaluated immediately. She is given return precautions.  Dark stools Continues to have issues with this. Somewhat improved. Did have positive FOBT previously. She again declines GI evaluation of this. We'll check a CBC today.  Fall at home Patient with nontender back exam today. Does have some mild tenderness over right soft tissue flank where there is mild  bruising. There is no rib tenderness. Discussed that given that she has been improving from this she should continue to monitor and if does not continue to improve she should let us know. Continue to use her rolling walker.   Orders Placed This Encounter  Procedures  . CBC  . Comp Met (CMET)  . EKG 12-Lead    Meds ordered this encounter  Medications  . nitroGLYCERIN (NITROSTAT) 0.4 MG SL tablet    Sig: DISSOLVE  ONE TABLET UNDER TONGUE IF NEEDED FOR CHEST PAIN    Dispense:  25 tablet    Refill:  1  . nitroGLYCERIN (NITRODUR - DOSED IN MG/24 HR) 0.4 mg/hr patch    Sig: APPLY ONE PATCH TO SKIN ONCE DAILY, LEAVE ON 12 TO 14 HOURS THEN REMOVE FOR 10 TO 12 HOURS BEFORE APPLYING NEXT PATCH    Dispense:  30 patch    Refill:  [place], MD [place]

## 2016-11-29 NOTE — Assessment & Plan Note (Addendum)
Patient with anginal symptoms earlier today. EKG completed which is similar to her prior EKGs with no apparent ischemic changes. Asymptomatic currently. Resolved with 1 nitroglycerin that was of a good date. Had a long discussion with the patient regarding evaluation for this by cardiology and the potential for adverse outcomes if she were not to be evaluated and have intervention if needed. She noted she understands this and did not want evaluation and wanted to continue as needed nitroglycerin with no intervention. Nitroglycerin will be refilled. I discussed if she has to take 3 tablets of this with no improvement she should be evaluated immediately. She is given return precautions.

## 2016-11-29 NOTE — Assessment & Plan Note (Signed)
Continues to have issues with this. Somewhat improved. Did have positive FOBT previously. She again declines GI evaluation of this. We'll check a CBC today.

## 2016-11-29 NOTE — Patient Instructions (Signed)
Nice to see you. We'll check some lab work today. I refilled your nitroglycerin. Please monitor your chest pain and for blood in your stool. If you develop chest pain please take a nitroglycerin tablet. You can take 1 every 5 minutes for 3 doses. If you get to the third dose and your pain is not improved please call 911. If you develop persistent chest pain, shortness of breath, sweatiness, radiation of the pain, or any new or changing symptoms please seek medical attention immediately.

## 2016-11-29 NOTE — Progress Notes (Signed)
Pre visit review using our clinic review tool, if applicable. No additional management support is needed unless otherwise documented below in the visit note. 

## 2016-11-29 NOTE — Assessment & Plan Note (Signed)
Patient with nontender back exam today. Does have some mild tenderness over right soft tissue flank where there is mild bruising. There is no rib tenderness. Discussed that given that she has been improving from this she should continue to monitor and if does not continue to improve she should let us know. Continue to use her rolling walker.

## 2016-12-09 ENCOUNTER — Other Ambulatory Visit: Payer: Self-pay | Admitting: Family Medicine

## 2016-12-09 NOTE — Telephone Encounter (Signed)
Last OV 11/29/16 last filled 08/06/16 60 3rf

## 2016-12-21 ENCOUNTER — Other Ambulatory Visit: Payer: Self-pay | Admitting: Nurse Practitioner

## 2017-01-31 ENCOUNTER — Encounter: Payer: Self-pay | Admitting: Family Medicine

## 2017-01-31 ENCOUNTER — Ambulatory Visit (INDEPENDENT_AMBULATORY_CARE_PROVIDER_SITE_OTHER): Payer: Medicare Other | Admitting: Family Medicine

## 2017-01-31 ENCOUNTER — Other Ambulatory Visit: Payer: Self-pay | Admitting: Family Medicine

## 2017-01-31 DIAGNOSIS — F3176 Bipolar disorder, in full remission, most recent episode depressed: Secondary | ICD-10-CM | POA: Diagnosis not present

## 2017-01-31 DIAGNOSIS — K429 Umbilical hernia without obstruction or gangrene: Secondary | ICD-10-CM | POA: Diagnosis not present

## 2017-01-31 DIAGNOSIS — R195 Other fecal abnormalities: Secondary | ICD-10-CM | POA: Diagnosis not present

## 2017-01-31 DIAGNOSIS — M069 Rheumatoid arthritis, unspecified: Secondary | ICD-10-CM

## 2017-01-31 NOTE — Progress Notes (Signed)
  Tommi Rumps, MD Phone: [phone]  Cindy Fitzpatrick is a 81 y.o. female who presents today for follow-up.  Umbilical hernia: Patient notes no pain. Has bowel movements daily. Notes this reduces fully. She declined surgery evaluation.  Patient has a history of rheumatoid arthritis. Has not been on any disease modifying agents for this. Previously seen by rheumatology. Reports she was on Gold salts in the distant past. Notes her hands occasionally bother her. Her knees occasionally bother her though have both been replaced. She takes Tylenol for this. She does not want any other evaluation or management of this.  Patient notes stools continued to be intermittently black, brown, and green. She notes very rare lightheadedness. No chest pain or shortness of breath. No palpitations. She declined GI evaluation.  Bipolar disorder: No recent manic episodes. Some anxiety though this has improved since going up on Wellbutrin. Currently taking Zoloft. No depression. No SI.  PMH: nonsmoker.   ROS see history of present illness  Objective  Physical Exam Vitals:   01/31/17 1358  BP: 102/60  Pulse: 81  Temp: 97.5 F (36.4 C)    BP Readings from Last 3 Encounters:  01/31/17 102/60  11/29/16 112/80  08/25/16 98/62   Wt Readings from Last 3 Encounters:  01/31/17 146 lb 3.2 oz (66.3 kg)  11/29/16 144 lb 3.2 oz (65.4 kg)  08/25/16 145 lb 12.8 oz (66.1 kg)    Physical Exam  Constitutional: No distress.  Cardiovascular: Normal rate, regular rhythm and normal heart sounds.   Pulmonary/Chest: Effort normal and breath sounds normal.  Abdominal: Soft. Bowel sounds are normal. She exhibits no distension. There is no tenderness. There is no rebound and no guarding.  Fairly large hernia just to the right of her umbilicus that fully reduces and is nontender  Musculoskeletal: She exhibits no edema.  Bilateral knees with midline scar is noted anteriorly, no swelling, warmth, erythema, or  tenderness, mild enlargement of the index and middle finger MCP joints on the right hand, no joint warmth or erythema in the hands, no joint tenderness in the hands  Neurological: She is alert.  Skin: Skin is warm and dry. She is not diaphoretic.     Assessment/Plan: Please see individual problem list.  Umbilical hernia No signs of obstruction or strangulation. She does not want to see a Psychologist, sport and exercise. She'll monitor. Given hernia return precautions.  Dark stools Continues to have intermittent dark stools. Positive FOBT previously. Hemoglobin was actually increasing at her last visit. She declines GI evaluation for this. Discussed continuing to monitor. Given return precautions.  Bipolar affective disorder ([place]) Consistent mostly with anxiety currently. No recent manic episodes. No depression currently. She'll continue on her current regimen.  Rheumatoid arthritis ([place]) She reports history of this. Not currently on any therapy for this. Previously followed by rheumatology. She does not want evaluation by them or further treatment other than Tylenol for this currently. She'll continue to monitor.   Tommi Rumps, MD [place]

## 2017-01-31 NOTE — Progress Notes (Signed)
Pre visit review using our clinic review tool, if applicable. No additional management support is needed unless otherwise documented below in the visit note. 

## 2017-01-31 NOTE — Assessment & Plan Note (Signed)
Continues to have intermittent dark stools. Positive FOBT previously. Hemoglobin was actually increasing at her last visit. She declines GI evaluation for this. Discussed continuing to monitor. Given return precautions.

## 2017-01-31 NOTE — Assessment & Plan Note (Signed)
No signs of obstruction or strangulation. She does not want to see a Psychologist, sport and exercise. She'll monitor. Given hernia return precautions.

## 2017-01-31 NOTE — Patient Instructions (Signed)
Nice to see you. If you develop pain at the site of your hernia or it does not reduce back in your abdomen you need to seek medical attention immediately. If you develop blood in your stool, lightheadedness, chest pain, or palpitations please seek medical attention immediately as well.  You may continue Tylenol as needed for arthritic pain.

## 2017-01-31 NOTE — Assessment & Plan Note (Signed)
Consistent mostly with anxiety currently. No recent manic episodes. No depression currently. She'll continue on her current regimen.

## 2017-01-31 NOTE — Assessment & Plan Note (Signed)
She reports history of this. Not currently on any therapy for this. Previously followed by rheumatology. She does not want evaluation by them or further treatment other than Tylenol for this currently. She'll continue to monitor.

## 2017-02-11 ENCOUNTER — Other Ambulatory Visit: Payer: Self-pay | Admitting: Family Medicine

## 2017-02-11 NOTE — Telephone Encounter (Signed)
Last OV 01/31/17 ok to fill Zoloft?

## 2017-02-21 ENCOUNTER — Other Ambulatory Visit: Payer: Self-pay | Admitting: Family Medicine

## 2017-02-21 NOTE — Telephone Encounter (Signed)
Last OV 01/31/17 last filled 08/30/16 90 1rf

## 2017-03-30 ENCOUNTER — Other Ambulatory Visit: Payer: Self-pay | Admitting: Family Medicine

## 2017-04-14 ENCOUNTER — Other Ambulatory Visit: Payer: Self-pay | Admitting: Family Medicine

## 2017-04-14 NOTE — Telephone Encounter (Signed)
Last OV 01/31/17 last filled 12/09/16 60 3rf

## 2017-05-11 ENCOUNTER — Other Ambulatory Visit: Payer: Self-pay | Admitting: Nurse Practitioner

## 2017-05-11 NOTE — Telephone Encounter (Signed)
Nitroglycerin patches 0.4 mg per hr Last OV 01/31/2017 Next ov: 06/03/2017 Last refilled 11/29/2016 [facility] Patient

## 2017-05-24 ENCOUNTER — Other Ambulatory Visit: Payer: Self-pay | Admitting: Family Medicine

## 2017-06-03 ENCOUNTER — Other Ambulatory Visit: Payer: Self-pay | Admitting: Nurse Practitioner

## 2017-06-03 ENCOUNTER — Encounter: Payer: Self-pay | Admitting: Family Medicine

## 2017-06-03 ENCOUNTER — Telehealth: Payer: Self-pay | Admitting: Radiology

## 2017-06-03 ENCOUNTER — Other Ambulatory Visit: Payer: Self-pay | Admitting: Family Medicine

## 2017-06-03 ENCOUNTER — Ambulatory Visit (INDEPENDENT_AMBULATORY_CARE_PROVIDER_SITE_OTHER): Payer: Medicare Other | Admitting: Family Medicine

## 2017-06-03 VITALS — BP 130/90 | HR 83 | Temp 97.8°F | Resp 18 | Wt 145.4 lb

## 2017-06-03 DIAGNOSIS — I10 Essential (primary) hypertension: Secondary | ICD-10-CM

## 2017-06-03 DIAGNOSIS — R300 Dysuria: Secondary | ICD-10-CM | POA: Diagnosis not present

## 2017-06-03 DIAGNOSIS — F3176 Bipolar disorder, in full remission, most recent episode depressed: Secondary | ICD-10-CM

## 2017-06-03 DIAGNOSIS — R195 Other fecal abnormalities: Secondary | ICD-10-CM

## 2017-06-03 DIAGNOSIS — G894 Chronic pain syndrome: Secondary | ICD-10-CM

## 2017-06-03 LAB — POCT URINALYSIS DIPSTICK
BILIRUBIN UA: NEGATIVE
GLUCOSE UA: NEGATIVE
KETONES UA: NEGATIVE
NITRITE UA: POSITIVE
Spec Grav, UA: 1.02 (ref 1.010–1.025)
Urobilinogen, UA: 0.2 E.U./dL
pH, UA: 5.5 (ref 5.0–8.0)

## 2017-06-03 LAB — URINALYSIS, MICROSCOPIC ONLY

## 2017-06-03 MED ORDER — CEPHALEXIN 500 MG PO CAPS: 500.0000 mg | ORAL_CAPSULE | Freq: Two times a day (BID) | ORAL | 0 refills | Status: DC

## 2017-06-03 NOTE — Telephone Encounter (Signed)
Opened in error

## 2017-06-03 NOTE — Patient Instructions (Signed)
Nice to see you. You have a UTI. We'll treat with Keflex. Please monitor stools. If they change or you have blood in her stool please be evaluated If you develop fevers or abdominal pain or any new or changing symptoms please seek medical attention.

## 2017-06-03 NOTE — Assessment & Plan Note (Signed)
Stable.  Continue current medications.

## 2017-06-03 NOTE — Progress Notes (Signed)
Tommi Rumps, MD Phone: [phone]  Cindy Fitzpatrick is a 81 y.o. female who presents today for follow-up.  Hypertension: Not checking her blood pressure. She's taking clonidine, diltiazem, losartan, HCTZ. No chest pain or shortness of breath. Does note chronic ankle and foot edema for a number of years. Goes down overnight. No orthopnea. Has not worsened.  Depression/anxiety: She notes intermittently feeling depressed and anxious depending on if anything worrisome occurs. She notes no SI or HI. She is on Zoloft and Wellbutrin. She does not desire to change medication.  She reports recently she has noticed her urine being darker than usual and also some burning with urination. She has no frequency or urgency. She notes no abdominal pain. She does note intermittent sweatiness during the daytime. No night sweats. Sweatiness has been intermittent for a number of years.  She reports her stools are stable. They are hard at times. Occasionally they alternate between green, brown, and black. No tarry stools. No blood in her stool. She is taking iron.  She also notes the achiness in her shoulders and arms has gotten worse. Moves down the radial aspect of her arms bilaterally. Occasional neck pain. No numbness or weakness. She does have advanced multilevel facet arthropathy and degenerative disc disease in her neck.  PMH: nonsmoker.   ROS see history of present illness  Objective  Physical Exam Vitals:   06/03/17 1331  BP: 130/90  Pulse: 83  Resp: 18  Temp: 97.8 F (36.6 C)  SpO2: 96%    BP Readings from Last 3 Encounters:  06/03/17 130/90  01/31/17 102/60  11/29/16 112/80   Wt Readings from Last 3 Encounters:  06/03/17 145 lb 6 oz (65.9 kg)  01/31/17 146 lb 3.2 oz (66.3 kg)  11/29/16 144 lb 3.2 oz (65.4 kg)    Physical Exam  Constitutional: No distress.  Cardiovascular: Normal rate, regular rhythm and normal heart sounds.   Pulmonary/Chest: Effort normal and breath sounds  normal.  Abdominal: Soft. Bowel sounds are normal. She exhibits no distension. There is no tenderness. There is no rebound and no guarding.  Fully reducible abdominal hernia with no tenderness  Musculoskeletal:  Full range of motion bilateral shoulders passively and actively with some discomfort on active internal and external range of motion, negative empty can, negative drop arm  Neurological: She is alert.  Skin: She is not diaphoretic.     Assessment/Plan: Please see individual problem list.  Benign essential HTN At goal. Continue current medications.  Chronic pain syndrome Suspect shoulder and arm pain is possibly coming from her neck given radial distribution and prior advanced degenerative changes on CT scan. Offered additional imaging versus physical therapy versus continuing management with Tylenol. Patient opted to continue with Tylenol. She'll continue to monitor. Given return precautions.  Dark stools Stools remained stable. Hemoglobin had been increasing. Offered GI evaluation. She declined. She wants to continue to monitor. She declines lab work today.  Dysuria Patient reports dysuria and discoloration of urine. UA consistent with UTI. We'll treat with Keflex. We'll send for urine culture microscopy. Given return precautions.  Bipolar affective disorder ([place]) Stable. Continue current medications.   Orders Placed This Encounter  Procedures  . POCT Urinalysis Dipstick    Meds ordered this encounter  Medications  . cephALEXin (KEFLEX) 500 MG capsule    Sig: Take 1 capsule (500 mg total) by mouth 2 (two) times daily.    Dispense:  14 capsule    Refill:  0    Tommi Rumps,  MD [place]

## 2017-06-03 NOTE — Assessment & Plan Note (Signed)
At goal. Continue current medications. 

## 2017-06-03 NOTE — Assessment & Plan Note (Signed)
Suspect shoulder and arm pain is possibly coming from her neck given radial distribution and prior advanced degenerative changes on CT scan. Offered additional imaging versus physical therapy versus continuing management with Tylenol. Patient opted to continue with Tylenol. She'll continue to monitor. Given return precautions.

## 2017-06-03 NOTE — Assessment & Plan Note (Signed)
Stools remained stable. Hemoglobin had been increasing. Offered GI evaluation. She declined. She wants to continue to monitor. She declines lab work today.

## 2017-06-03 NOTE — Assessment & Plan Note (Signed)
Patient reports dysuria and discoloration of urine. UA consistent with UTI. We'll treat with Keflex. We'll send for urine culture microscopy. Given return precautions.

## 2017-06-06 LAB — URINE CULTURE

## 2017-06-30 ENCOUNTER — Other Ambulatory Visit: Payer: Self-pay | Admitting: Family Medicine

## 2017-07-28 ENCOUNTER — Other Ambulatory Visit: Payer: Self-pay | Admitting: Family Medicine

## 2017-07-28 ENCOUNTER — Other Ambulatory Visit: Payer: Self-pay | Admitting: Nurse Practitioner

## 2017-08-12 ENCOUNTER — Other Ambulatory Visit: Payer: Self-pay | Admitting: Family Medicine

## 2017-08-27 ENCOUNTER — Other Ambulatory Visit: Payer: Self-pay | Admitting: Family Medicine

## 2017-09-26 ENCOUNTER — Ambulatory Visit: Payer: Medicare Other | Admitting: Family Medicine

## 2017-11-15 ENCOUNTER — Encounter: Payer: Self-pay | Admitting: Internal Medicine

## 2017-11-15 ENCOUNTER — Ambulatory Visit: Payer: Medicare Other | Admitting: Internal Medicine

## 2017-11-15 ENCOUNTER — Other Ambulatory Visit: Payer: Self-pay | Admitting: Family Medicine

## 2017-11-15 VITALS — BP 122/84 | HR 85 | Temp 97.5°F | Resp 18 | Ht 65.0 in | Wt 145.0 lb

## 2017-11-15 DIAGNOSIS — N3000 Acute cystitis without hematuria: Secondary | ICD-10-CM | POA: Insufficient documentation

## 2017-11-15 DIAGNOSIS — R300 Dysuria: Secondary | ICD-10-CM

## 2017-11-15 DIAGNOSIS — Z23 Encounter for immunization: Secondary | ICD-10-CM

## 2017-11-15 LAB — POCT URINALYSIS DIP (MANUAL ENTRY)
BILIRUBIN UA: NEGATIVE
GLUCOSE UA: NEGATIVE mg/dL
Ketones, POC UA: NEGATIVE mg/dL
Nitrite, UA: NEGATIVE
Protein Ur, POC: 30 mg/dL — AB
SPEC GRAV UA: 1.015 (ref 1.010–1.025)
Urobilinogen, UA: 0.2 E.U./dL
pH, UA: 5.5 (ref 5.0–8.0)

## 2017-11-15 LAB — URINALYSIS, MICROSCOPIC ONLY

## 2017-11-15 MED ORDER — CEPHALEXIN 500 MG PO CAPS: 500.0000 mg | ORAL_CAPSULE | Freq: Two times a day (BID) | ORAL | 0 refills | Status: DC

## 2017-11-15 NOTE — Patient Instructions (Signed)
Try AZO over the counter for burning Follow with primary care doctor as scheduled  Take care   Urinary Tract Infection, Adult A urinary tract infection (UTI) is an infection of any part of the urinary tract. The urinary tract includes the:  Kidneys.  Ureters.  Bladder.  Urethra.  These organs make, store, and get rid of pee (urine) in the body. Follow these instructions at home:  Take over-the-counter and prescription medicines only as told by your doctor.  If you were prescribed an antibiotic medicine, take it as told by your doctor. Do not stop taking the antibiotic even if you start to feel better.  Avoid the following drinks: ? Alcohol. ? Caffeine. ? Tea. ? Carbonated drinks.  Drink enough fluid to keep your pee clear or pale yellow.  Keep all follow-up visits as told by your doctor. This is important.  Make sure to: ? Empty your bladder often and completely. Do not to hold pee for long periods of time. ? Empty your bladder before and after sex. ? Wipe from front to back after a bowel movement if you are female. Use each tissue one time when you wipe. Contact a doctor if:  You have back pain.  You have a fever.  You feel sick to your stomach (nauseous).  You throw up (vomit).  Your symptoms do not get better after 3 days.  Your symptoms go away and then come back. Get help right away if:  You have very bad back pain.  You have very bad lower belly (abdominal) pain.  You are throwing up and cannot keep down any medicines or water. This information is not intended to replace advice given to you by your health care provider. Make sure you discuss any questions you have with your health care provider. Document Released: 03/29/2008 Document Revised: 03/18/2016 Document Reviewed: 09/01/2015 Elsevier Interactive Patient Education  [employer].

## 2017-11-15 NOTE — Progress Notes (Signed)
Chief Complaint  Patient presents with  . Urinary Frequency   Acute visit with son whom she lives  C/o burning with peeing, increased freq at times every 5 minutes, not emptying completely x 4 days. She is not drinking as much water as she should. She reports another episode UTI 05/2017 which was E coli where keflex 500 mg bid helped.  She is wearing a pad and changing every 3 days.     Review of Systems  Respiratory: Negative for shortness of breath.   Cardiovascular: Negative for chest pain.  Gastrointestinal: Positive for abdominal pain.  Genitourinary: Positive for dysuria, frequency and urgency. Negative for hematuria.   Past Medical History:  Diagnosis Date  . Arthritis   . Blood in stool   . Cancer ([place])    skin  . Chicken pox    SHINGLES TWICE  . Colon polyps   . Depression   . Diverticulitis   . GERD (gastroesophageal reflux disease)   . Hay fever   . Heart disease   . Heart murmur   . Hyperlipidemia   . UTI (urinary tract infection)    Past Surgical History:  Procedure Laterality Date  . HERNIA REPAIR    . KNEE SURGERY     Family History  Problem Relation Age of Onset  . Stroke Mother   . Heart attack Father    Social History   Socioeconomic History  . Marital status: Widowed    Spouse name: Not on file  . Number of children: Not on file  . Years of education: Not on file  . Highest education level: Not on file  Social Needs  . Financial resource strain: Not on file  . Food insecurity - worry: Not on file  . Food insecurity - inability: Not on file  . Transportation needs - medical: Not on file  . Transportation needs - non-medical: Not on file  Occupational History  . Not on file  Tobacco Use  . Smoking status: Never Smoker  . Smokeless tobacco: Never Used  Substance and Sexual Activity  . Alcohol use: No    Alcohol/week: 0.0 oz  . Drug use: No  . Sexual activity: No  Other Topics Concern  . Not on file  Social History Narrative  . Not on  file   Current Meds  Medication Sig  . acetaminophen (TYLENOL) 650 MG CR tablet Take 650 mg by mouth every 8 (eight) hours as needed for pain.  . bisacodyl (DULCOLAX) 5 MG EC tablet Take 5 mg by mouth 2 (two) times daily as needed for moderate constipation.  Marland Kitchen buPROPion (WELLBUTRIN SR) 150 MG 12 hr tablet TAKE 1 TABLET BY MOUTH TWO TIMES DAILY  . buPROPion (WELLBUTRIN SR) 150 MG 12 hr tablet TAKE 1 TABLET BY MOUTH TWICE DAILY  . buPROPion (WELLBUTRIN SR) 150 MG 12 hr tablet TAKE 1 TABLET BY MOUTH TWICE DAILY  . Calcium Carb-Cholecalciferol 600-800 MG-UNIT TABS Take 1 tablet by mouth daily.   . cloNIDine (CATAPRES) 0.1 MG tablet TAKE 1 TABLET BY MOUTH TWO TIMES DAILY  . cloNIDine (CATAPRES) 0.1 MG tablet TAKE 1 TABLET BY MOUTH TWO TIMES DAILY  . diltiazem (CARDIZEM) 30 MG tablet TAKE 1 TABLET 3 TIMES DAILY  . diltiazem (CARDIZEM) 30 MG tablet TAKE 1 TABLET BY MOUTH 3 TIMES DAILY  . ferrous sulfate 325 (65 FE) MG tablet Take 1 tablet (325 mg total) by mouth 2 (two) times daily with a meal.  . losartan-hydrochlorothiazide (HYZAAR) 50-12.5 MG tablet  TAKE 1 TABLET EVERY DAY  . Multiple Vitamins-Minerals (CENTRUM SILVER PO) Take 1 tablet by mouth daily.  . nitroGLYCERIN (NITRODUR - DOSED IN MG/24 HR) 0.4 mg/hr patch APPLY ONE PATCH TO SKIN ONCE DAILY, LEAVE ON 12 TO 14 HOURS THEN REMOVE FOR 10 TO 12 HOURS BEFORE APPLYING NEXT PATCH  . nitroGLYCERIN (NITRODUR - DOSED IN MG/24 HR) 0.4 mg/hr patch APPLY ONE PATCH TO SKIN ONCE DAILY, LEAVE ON 12 TO 14 HOURS THEN REMOVE FOR 10 TO 12 HOURS BEFORE APPLYING NEXT PATCH  . nitroGLYCERIN (NITRODUR - DOSED IN MG/24 HR) 0.4 mg/hr patch APPLY ONE PATCH TO SKIN ONCE DAILY, LEAVE ON 12 TO 14 HOURS THEN REMOVE FOR 10 TO 12 HOURS BEFORE APPLYING NEXT PATCH  . nitroGLYCERIN (NITRODUR - DOSED IN MG/24 HR) 0.4 mg/hr patch APPLY 1 PATCH TO SKIN ONCE DAILY, LEAVE ON 12 TO 14 HOURS THEN REMOVEFOR 10 TO 12 HOURS BEFORE APPLYING NEXT PATCH.  . Omega-3 Fatty Acids (FISH OIL)  1200 MG CAPS Take 1 capsule by mouth daily.   Marland Kitchen omeprazole (PRILOSEC) 20 MG capsule Take 20 mg by mouth 2 (two) times daily before a meal.  . Polyethyl Glycol-Propyl Glycol 0.4-0.3 % SOLN Apply 1 drop to eye as needed (dry eyes).   . potassium chloride (K-DUR) 10 MEQ tablet Take 1 tablet (10 mEq total) by mouth daily.  . potassium chloride (K-DUR,KLOR-CON) 10 MEQ tablet TAKE 1 TABLET BY MOUTH DAILY  . potassium chloride (K-DUR,KLOR-CON) 10 MEQ tablet TAKE 1 TABLET BY MOUTH DAILY  . sertraline (ZOLOFT) 100 MG tablet TAKE TWO TABLETS AT BEDTIME   Allergies  Allergen Reactions  . Codeine     Other reaction(s): Headache  . Hydrocodone-Acetaminophen Nausea And Vomiting  . Meperidine     Other reaction(s): Dizziness  . Niacin Er Other (See Comments)    Patient doesn't remember  . Other Nausea Only  . Propoxyphene     Other reaction(s): Dizziness  . Solifenacin Other (See Comments)  . Sulfa Antibiotics Other (See Comments)    Patient doesn' t remember  . Fish-Derived Products Rash    Weakness  . Nabumetone Rash  . Oxycodone-Acetaminophen Rash    Couldn't think or remember anything   Recent Results (from the past 2160 hour(s))  POCT urinalysis dipstick     Status: Abnormal   Collection Time: 11/15/17 10:32 AM  Result Value Ref Range   Color, UA yellow yellow   Clarity, UA cloudy (A) clear   Glucose, UA negative negative mg/dL   Bilirubin, UA negative negative   Ketones, POC UA negative negative mg/dL   Spec Grav, UA 1.015 1.010 - 1.025   Blood, UA moderate (A) negative   pH, UA 5.5 5.0 - 8.0   Protein Ur, POC =30 (A) negative mg/dL   Urobilinogen, UA 0.2 0.2 or 1.0 E.U./dL   Nitrite, UA Negative Negative   Leukocytes, UA Large (3+) (A) Negative   Objective  Body mass index is 24.13 kg/m. Wt Readings from Last 3 Encounters:  11/15/17 145 lb (65.8 kg)  06/03/17 145 lb 6 oz (65.9 kg)  01/31/17 146 lb 3.2 oz (66.3 kg)   Temp Readings from Last 3 Encounters:  11/15/17 (!)  97.5 F (36.4 C) (Oral)  06/03/17 97.8 F (36.6 C) (Oral)  01/31/17 97.5 F (36.4 C) (Oral)   BP Readings from Last 3 Encounters:  11/15/17 122/84  06/03/17 130/90  01/31/17 102/60   Pulse Readings from Last 3 Encounters:  11/15/17 85  06/03/17 83  01/31/17 81   O2 sat room air 99%  Physical Exam  Constitutional: She is oriented to person, place, and time and well-developed, well-nourished, and in no distress. Vital signs are normal.  HENT:  Head: Normocephalic and atraumatic.  Eyes: Conjunctivae are normal. Pupils are equal, round, and reactive to light.  Cardiovascular: Normal rate and regular rhythm.  Murmur heard. Pulmonary/Chest: Effort normal and breath sounds normal.  Abdominal: Soft. Bowel sounds are normal. There is no tenderness.    Neurological: She is alert and oriented to person, place, and time. Gait normal.  Skin: Skin is warm, dry and intact.  Psychiatric: Mood, memory, affect and judgment normal.  Nursing note and vitals reviewed.   Assessment   1. UTI likely  Plan  1. UA and culture today  Refilled Keflex 500 mg bid x 7 days if feeling better can stop day 5  Try OTC AZO for dysuria  RTC as sch with PCP   Given prevnar today  Provider: Dr. Olivia Mackie McLean-Scocuzza-[facility] Medicine

## 2017-11-17 LAB — URINE CULTURE
MICRO NUMBER:: 90090559
SPECIMEN QUALITY:: ADEQUATE

## 2017-11-18 ENCOUNTER — Other Ambulatory Visit: Payer: Self-pay | Admitting: Internal Medicine

## 2017-11-18 ENCOUNTER — Ambulatory Visit: Payer: Self-pay | Admitting: *Deleted

## 2017-11-18 DIAGNOSIS — N3001 Acute cystitis with hematuria: Secondary | ICD-10-CM

## 2017-11-18 MED ORDER — CIPROFLOXACIN HCL 500 MG PO TABS: 500.0000 mg | ORAL_TABLET | Freq: Two times a day (BID) | ORAL | 0 refills | Status: DC

## 2017-11-18 NOTE — Progress Notes (Signed)
Sent stop keflex  [place]

## 2017-11-18 NOTE — Telephone Encounter (Signed)
She called in saying she was on Cephalexin 500 mg for a urinary tract infection.  She complained of having chills, sweating and being dizzy but not today.  (She was difficult to triage due to inconsistencies in her information).  She took 2 of the pills on Tuesday and Wednesday.  Thursday she only took 1 pill and today she has not taken any. She is not having burning or frequency with urination now.  I was finally able to determine from her that she has stopped the antibiotic due to chills, sweating and dizziness she thinks is coming from the medication. She mentioned she lives at [facility].  She didn't want to get the nurse there involved.  I asked the pt why.   She said because she doesn't have any money and it costs to have the nurse come see her.  It also costs her to go to doctor's appt and she doesn't have the money.  After talking with her I contacted her son Cybill Uriegas via phone.  I was concerned about her.   I let him know who I was and he said he just got off of the phone with someone in the office about her antibiotic.  They are changing her to a different antibiotic.   He has been trying to call her but couldn't get through probably because she was talking to me.  He is going to check on her now also. No further action taken since he just talked with the office and the antibiotic is being changed and he is going to check on her now.   Reason for Disposition . Caller has NON-URGENT medication question about med that PCP prescribed and triager unable to answer question  Answer Assessment - Initial Assessment Questions 1. SYMPTOMS: "Do you have any symptoms?"     I'm no longer burning and having frequency. 2. SEVERITY: If symptoms are present, ask "Are they mild, moderate or severe?"     Yesterday I felt sweaty, chills and was dizzy over the weekend.  Protocols used: MEDICATION QUESTION CALL-A-AH

## 2017-11-30 ENCOUNTER — Encounter: Payer: Self-pay | Admitting: Family Medicine

## 2017-11-30 ENCOUNTER — Ambulatory Visit: Payer: Medicare Other | Admitting: Family Medicine

## 2017-11-30 ENCOUNTER — Other Ambulatory Visit: Payer: Self-pay

## 2017-11-30 VITALS — BP 118/70 | HR 81 | Temp 97.4°F | Wt 144.8 lb

## 2017-11-30 DIAGNOSIS — F3176 Bipolar disorder, in full remission, most recent episode depressed: Secondary | ICD-10-CM | POA: Diagnosis not present

## 2017-11-30 DIAGNOSIS — E785 Hyperlipidemia, unspecified: Secondary | ICD-10-CM | POA: Diagnosis not present

## 2017-11-30 DIAGNOSIS — K429 Umbilical hernia without obstruction or gangrene: Secondary | ICD-10-CM | POA: Diagnosis not present

## 2017-11-30 DIAGNOSIS — N3000 Acute cystitis without hematuria: Secondary | ICD-10-CM

## 2017-11-30 DIAGNOSIS — R7309 Other abnormal glucose: Secondary | ICD-10-CM

## 2017-11-30 DIAGNOSIS — T148XXA Other injury of unspecified body region, initial encounter: Secondary | ICD-10-CM | POA: Diagnosis not present

## 2017-11-30 DIAGNOSIS — I10 Essential (primary) hypertension: Secondary | ICD-10-CM | POA: Diagnosis not present

## 2017-11-30 LAB — COMPREHENSIVE METABOLIC PANEL
ALBUMIN: 4.2 g/dL (ref 3.5–5.2)
ALK PHOS: 65 U/L (ref 39–117)
ALT: 13 U/L (ref 0–35)
AST: 17 U/L (ref 0–37)
BUN: 20 mg/dL (ref 6–23)
CALCIUM: 9.4 mg/dL (ref 8.4–10.5)
CO2: 32 mEq/L (ref 19–32)
Chloride: 102 mEq/L (ref 96–112)
Creatinine, Ser: 0.91 mg/dL (ref 0.40–1.20)
GFR: 63.29 mL/min (ref >60.00)
GLUCOSE: 111 mg/dL — AB (ref 70–99)
POTASSIUM: 4.5 meq/L (ref 3.5–5.1)
Sodium: 138 mEq/L (ref 135–145)
TOTAL PROTEIN: 6.6 g/dL (ref 6.0–8.3)
Total Bilirubin: 0.4 mg/dL (ref 0.2–1.2)

## 2017-11-30 LAB — HEMOGLOBIN A1C: HEMOGLOBIN A1C: 5.6 % (ref 4.6–6.5)

## 2017-11-30 MED ORDER — CLONIDINE HCL 0.1 MG PO TABS: 0.1000 mg | ORAL_TABLET | Freq: Two times a day (BID) | ORAL | 0 refills | Status: DC

## 2017-11-30 NOTE — Assessment & Plan Note (Signed)
Possibly mechanical related to her glasses.  Appears benign.  Offered referral to dermatology though she declined.  She will see her eye doctor to determine whether glasses are playing a role.  She will monitor.

## 2017-11-30 NOTE — Assessment & Plan Note (Signed)
Well-controlled.  Check lab work. 

## 2017-11-30 NOTE — Patient Instructions (Signed)
Nice to see you. We will check lab work today and contact you with the results. Please monitor the blister area glasses.  Please see if he can get the glasses changed.  If you would like to see dermatology please let us know.

## 2017-11-30 NOTE — Assessment & Plan Note (Signed)
Asymptomatic.  Continue current regimen. 

## 2017-11-30 NOTE — Assessment & Plan Note (Signed)
Patient notes she would never take cholesterol medicine again.  Discussed given that she would not undergo treatment we will defer testing.

## 2017-11-30 NOTE — Progress Notes (Signed)
  Tommi Rumps, MD Phone: [phone]  Cindy Fitzpatrick is a 82 y.o. female who presents today for follow-up.  Hypertension: Not checking at home.  Taking clonidine, losartan, HCTZ.  No chest pain, shortness of breath, orthopnea, or PND.  No change in her edema.  Rare lightheadedness if any.  Bipolar disorder: No recent issues with depression or mania.  She has been stable on Wellbutrin and Zoloft for several years.  Anxiety is stable and no more than usual.  Recently treated for UTI.  She finished the course of Keflex.  She did not take the ciprofloxacin.  She notes she had frequency, dysuria, and urgency with no hematuria.  Symptoms have resolved.  She has developed a blister near her right nose after getting her current pair of glasses.  Her abdominal hernia has been stable.  It always reduces.  No pain.  Bowel movements most days.  Social History   Tobacco Use  Smoking Status Never Smoker  Smokeless Tobacco Never Used     ROS see history of present illness  Objective  Physical Exam Vitals:   11/30/17 1417  BP: 118/70  Pulse: 81  Temp: (!) 97.4 F (36.3 C)  SpO2: 98%    BP Readings from Last 3 Encounters:  11/30/17 118/70  11/15/17 122/84  06/03/17 130/90   Wt Readings from Last 3 Encounters:  11/30/17 144 lb 12.8 oz (65.7 kg)  11/15/17 145 lb (65.8 kg)  06/03/17 145 lb 6 oz (65.9 kg)    Physical Exam  Constitutional: No distress.  HENT:  Head:    Cardiovascular: Normal rate and regular rhythm.  Murmur (2/6 systolic) heard. Pulmonary/Chest: Effort normal and breath sounds normal.  Abdominal: Soft. She exhibits no distension. There is no tenderness.  Ventral hernia noted, fully reducible, nontender  Musculoskeletal: She exhibits no edema.  Neurological: She is alert. Gait normal.  Skin: Skin is warm and dry. She is not diaphoretic.     Assessment/Plan: Please see individual problem list.  HLD (hyperlipidemia) Patient notes she would never take  cholesterol medicine again.  Discussed given that she would not undergo treatment we will defer testing.  Umbilical hernia Stable.  She declines referral.  Given hernia return precautions.  Acute cystitis without hematuria Resolved with Keflex.  Benign essential HTN Well-controlled.  Check lab work.  Bipolar affective disorder ([place]) Asymptomatic.  Continue current regimen.  Blister Possibly mechanical related to her glasses.  Appears benign.  Offered referral to dermatology though she declined.  She will see her eye doctor to determine whether glasses are playing a role.  She will monitor.   Orders Placed This Encounter  Procedures  . Comp Met (CMET)  . HgB A1c    No orders of the defined types were placed in this encounter.    Tommi Rumps, MD [place]

## 2017-11-30 NOTE — Assessment & Plan Note (Signed)
Stable.  She declines referral.  Given hernia return precautions.

## 2017-11-30 NOTE — Assessment & Plan Note (Signed)
Resolved with Keflex.

## 2017-12-01 ENCOUNTER — Other Ambulatory Visit: Payer: Self-pay | Admitting: Family Medicine

## 2017-12-01 ENCOUNTER — Telehealth: Payer: Self-pay

## 2017-12-01 MED ORDER — BUPROPION HCL ER (SR) 150 MG PO TB12: 150.0000 mg | ORAL_TABLET | Freq: Two times a day (BID) | ORAL | 3 refills | Status: DC

## 2017-12-01 NOTE — Telephone Encounter (Signed)
-----   Message from [name], MD sent at 11/30/2017  6:55 PM EST ----- Please let the patient know her lab work is acceptable.  Thanks.

## 2017-12-02 ENCOUNTER — Other Ambulatory Visit: Payer: Self-pay

## 2017-12-02 MED ORDER — BUPROPION HCL ER (SR) 150 MG PO TB12: 150.0000 mg | ORAL_TABLET | Freq: Two times a day (BID) | ORAL | 0 refills | Status: DC

## 2017-12-08 ENCOUNTER — Other Ambulatory Visit: Payer: Self-pay | Admitting: Family Medicine

## 2017-12-23 ENCOUNTER — Other Ambulatory Visit: Payer: Self-pay | Admitting: Family Medicine

## 2018-01-04 ENCOUNTER — Other Ambulatory Visit: Payer: Self-pay | Admitting: Family Medicine

## 2018-02-03 ENCOUNTER — Other Ambulatory Visit: Payer: Self-pay | Admitting: Family Medicine

## 2018-02-07 ENCOUNTER — Other Ambulatory Visit: Payer: Self-pay | Admitting: Family Medicine

## 2018-02-16 ENCOUNTER — Emergency Department: Payer: Medicare Other

## 2018-02-16 ENCOUNTER — Observation Stay
Admission: EM | Admit: 2018-02-16 | Discharge: 2018-02-20 | Disposition: A | Discharge status: Skilled Nursing Facility | Payer: Medicare Other | Attending: Internal Medicine | Admitting: Internal Medicine

## 2018-02-16 ENCOUNTER — Encounter: Payer: Self-pay | Admitting: *Deleted

## 2018-02-16 DIAGNOSIS — W2203XA Walked into furniture, initial encounter: Secondary | ICD-10-CM | POA: Diagnosis not present

## 2018-02-16 DIAGNOSIS — K589 Irritable bowel syndrome without diarrhea: Secondary | ICD-10-CM | POA: Insufficient documentation

## 2018-02-16 DIAGNOSIS — M069 Rheumatoid arthritis, unspecified: Secondary | ICD-10-CM | POA: Diagnosis not present

## 2018-02-16 DIAGNOSIS — R52 Pain, unspecified: Secondary | ICD-10-CM

## 2018-02-16 DIAGNOSIS — Z888 Allergy status to other drugs, medicaments and biological substances status: Secondary | ICD-10-CM | POA: Diagnosis not present

## 2018-02-16 DIAGNOSIS — I10 Essential (primary) hypertension: Secondary | ICD-10-CM | POA: Insufficient documentation

## 2018-02-16 DIAGNOSIS — Z7982 Long term (current) use of aspirin: Secondary | ICD-10-CM | POA: Diagnosis not present

## 2018-02-16 DIAGNOSIS — N179 Acute kidney failure, unspecified: Secondary | ICD-10-CM | POA: Diagnosis not present

## 2018-02-16 DIAGNOSIS — Z885 Allergy status to narcotic agent status: Secondary | ICD-10-CM | POA: Insufficient documentation

## 2018-02-16 DIAGNOSIS — M8580 Other specified disorders of bone density and structure, unspecified site: Secondary | ICD-10-CM | POA: Diagnosis not present

## 2018-02-16 DIAGNOSIS — Z86010 Personal history of colonic polyps: Secondary | ICD-10-CM | POA: Diagnosis not present

## 2018-02-16 DIAGNOSIS — S8012XA Contusion of left lower leg, initial encounter: Secondary | ICD-10-CM | POA: Diagnosis not present

## 2018-02-16 DIAGNOSIS — F319 Bipolar disorder, unspecified: Secondary | ICD-10-CM | POA: Insufficient documentation

## 2018-02-16 DIAGNOSIS — Z882 Allergy status to sulfonamides status: Secondary | ICD-10-CM | POA: Diagnosis not present

## 2018-02-16 DIAGNOSIS — Z8619 Personal history of other infectious and parasitic diseases: Secondary | ICD-10-CM | POA: Insufficient documentation

## 2018-02-16 DIAGNOSIS — F419 Anxiety disorder, unspecified: Secondary | ICD-10-CM | POA: Diagnosis not present

## 2018-02-16 DIAGNOSIS — Z79899 Other long term (current) drug therapy: Secondary | ICD-10-CM | POA: Diagnosis not present

## 2018-02-16 DIAGNOSIS — K219 Gastro-esophageal reflux disease without esophagitis: Secondary | ICD-10-CM | POA: Insufficient documentation

## 2018-02-16 HISTORY — DX: Asymptomatic varicose veins of bilateral lower extremities: I83.93

## 2018-02-16 MED ORDER — MORPHINE SULFATE (PF) 4 MG/ML IV SOLN
4.0000 mg | Freq: Once | INTRAVENOUS | Status: AC
  Administered 2018-02-16: 4 mg via INTRAMUSCULAR
  Filled 2018-02-16: qty 1

## 2018-02-16 NOTE — ED Provider Notes (Signed)
[facility] Emergency Department Provider Note ____________________________________________   First MD Initiated Contact with Patient 02/16/18 2204     (approximate)  I have reviewed the triage vital signs and the nursing notes.   HISTORY  Chief Complaint Leg Pain    HPI Cindy Fitzpatrick is a 82 y.o. female with PMH as noted below who presents with left leg injury, acute onset a few hours ago, occurring when she struck her leg on the edge of her recliner, and associated with gradually worsening swelling to the lower leg and spreading up towards the knee.  Patient denies any other injuries.   Past Medical History:  Diagnosis Date  . Arthritis   . Blood in stool   . Cancer ([place])    skin  . Chicken pox    SHINGLES TWICE  . Colon polyps   . Depression   . Diverticulitis   . GERD (gastroesophageal reflux disease)   . Hay fever   . Heart disease   . Heart murmur   . Hyperlipidemia   . UTI (urinary tract infection)   . Varicose veins of both lower extremities     Patient Active Problem List   Diagnosis Date Noted  . Blister 11/30/2017  . Acute cystitis without hematuria 11/15/2017  . Dark stools 08/26/2016  . Diverticulitis of small intestine without perforation or abscess with bleeding   . Ischemic chest pain   . History of coronary artery stent placement   . Heart murmur 04/05/2016  . Umbilical hernia 99/24/2683  . Osteopenia 01/21/2016  . Fall at home 12/25/2015  . Chronic pain syndrome 02/13/2015  . Fatigue 02/13/2015  . Obesity (BMI 30-39.9) 12/01/2014  . Bipolar affective disorder ([place]) 03/25/2014  . Rheumatoid arthritis ([place]) 03/25/2014  . CAD in native artery 03/25/2014  . Acid reflux 03/25/2014  . Benign essential HTN 03/25/2014  . Adaptive colitis 03/25/2014  . HLD (hyperlipidemia) 03/25/2014    Past Surgical History:  Procedure Laterality Date  . HERNIA REPAIR    . KNEE SURGERY      Prior to Admission medications     Medication Sig Start Date End Date Taking? Authorizing Provider  acetaminophen (TYLENOL) 650 MG CR tablet Take 650 mg by mouth every 8 (eight) hours as needed for pain.    [provider]  aspirin EC 81 MG tablet Take 81 mg by mouth daily.    [provider]  bisacodyl (DULCOLAX) 5 MG EC tablet Take 5 mg by mouth 2 (two) times daily as needed for moderate constipation.    [provider]  buPROPion [facility] SR) 150 MG 12 hr tablet TAKE 1 TABLET BY MOUTH TWICE DAILY 12/23/17   [name], MD  Calcium Carb-Cholecalciferol 600-800 MG-UNIT TABS Take 1 tablet by mouth daily.     [provider]  cloNIDine (CATAPRES) 0.1 MG tablet TAKE 1 TABLET BY MOUTH TWICE DAILY 01/05/18   [name], MD  diltiazem (CARDIZEM) 30 MG tablet TAKE 1 TABLET THREE TIMES DAILY 12/23/17   [name], MD  ferrous sulfate 325 (65 FE) MG tablet Take 1 tablet (325 mg total) by mouth 2 (two) times daily with a meal. 04/23/16   Vaughan Basta, MD  losartan-hydrochlorothiazide [facility]) 50-12.5 MG tablet TAKE 1 TABLET EVERY DAY 08/12/17   [name], MD  Multiple Vitamins-Minerals (CENTRUM SILVER PO) Take 1 tablet by mouth daily.    [provider]  nitroGLYCERIN (NITRODUR - DOSED IN MG/24 HR) 0.4 mg/hr  patch APPLY ONE PATCH TO SKIN ONCE DAILY, LEAVE ON 12 TO 14 HOURS THEN REMOVE FOR 10 TO 12 HOURS BEFORE APPLYING NEXT PATCH 05/12/17   Cook, Jayce G, DO  nitroGLYCERIN (NITRODUR - DOSED IN MG/24 HR) 0.4 mg/hr patch APPLY ONE PATCH TO SKIN ONCE DAILY, LEAVE ON 12 TO 14 HOURS THEN REMOVE FOR 10 TO 12 HOURS BEFORE APPLYING NEXT PATCH 07/29/17   [name], MD  nitroGLYCERIN (NITRODUR - DOSED IN MG/24 HR) 0.4 mg/hr patch APPLY 1 PATCH TO SKIN ONCE DAILY, LEAVE ON 12 TO 14 HOURS THEN REMOVEFOR 10 TO 12 HOURS BEFORE APPLYING NEXT PATCH. 08/12/17   [name], MD  nitroGLYCERIN (NITRODUR - DOSED IN MG/24 HR) 0.4 mg/hr patch APPLY 1 PATCH DAILY.  LEAVE ON 12-14 HOURS AND REMOVE FOR 10-12 HOURS BEFORE APPLYING NEXT PATCH. 02/13/18   [name], MD  nitroGLYCERIN (NITRODUR - DOSED IN MG/24 HR) 0.4 mg/hr patch APPLY 1 PATCH DAILY LEAVE ON 12 TO 14 HOURS AND REMOVE FOR 10 TO 12 HOURS BEFORE APPLYING NEXT PATCH 02/13/18   [name], MD  Omega-3 Fatty Acids (FISH OIL) 1200 MG CAPS Take 1 capsule by mouth daily.     [provider]  omeprazole (PRILOSEC) 20 MG capsule Take 20 mg by mouth 2 (two) times daily before a meal.    [provider]  Polyethyl Glycol-Propyl Glycol 0.4-0.3 % SOLN Apply 1 drop to eye as needed (dry eyes).     [provider]  potassium chloride (K-DUR) 10 MEQ tablet Take 1 tablet (10 mEq total) by mouth daily. 08/06/16   [name], MD  potassium chloride (K-DUR,KLOR-CON) 10 MEQ tablet TAKE 1 TABLET BY MOUTH DAILY 06/03/17   [name], MD  sertraline (ZOLOFT) 100 MG tablet TAKE 2 TABLETS BY MOUTH AT BEDTIME 02/13/18   [name], MD    Allergies Codeine; Hydrocodone-acetaminophen; Meperidine; Niacin er; Other; Propoxyphene; Solifenacin; Sulfa antibiotics; Fish-derived products; Nabumetone; and Oxycodone-acetaminophen  Family History  Problem Relation Age of Onset  . Stroke Mother   . Heart attack Father     Social History Social History   Tobacco Use  . Smoking status: Never Smoker  . Smokeless tobacco: Never Used  Substance Use Topics  . Alcohol use: No    Alcohol/week: 0.0 oz  . Drug use: No    Review of Systems  Constitutional: No fever. Eyes: No redness. ENT: No neck pain. Cardiovascular: Denies chest pain. Gastrointestinal: No abdominal pain Musculoskeletal: Positive for left leg injury. Skin: Negative for rash. Neurological: Negative for focal weakness or numbness.   ____________________________________________   PHYSICAL EXAM:  VITAL SIGNS: ED Triage Vitals  Enc Vitals Group     BP 02/16/18 2102 (!) 185/91     Pulse Rate  02/16/18 2102 99     Resp 02/16/18 2102 (!) 22     Temp 02/16/18 2102 98.9 F (37.2 C)     Temp Source 02/16/18 2102 Oral     SpO2 02/16/18 2102 100 %     Weight 02/16/18 2103 144 lb (65.3 kg)     Height --      Head Circumference --      Peak Flow --      Pain Score 02/16/18 2102 5     Pain Loc --      Pain Edu? --      Excl. in [place]? --     Constitutional: Alert and oriented.  Relatively comfortable appearing and  in no acute distress. Eyes: Conjunctivae are normal.  Head: Atraumatic. Nose: No congestion/rhinnorhea. Mouth/Throat: Mucous membranes are moist.   Neck: Normal range of motion.  Cardiovascular: Normal rate, regular rhythm. Good peripheral circulation. Respiratory: Normal respiratory effort.  Gastrointestinal: No distention.  Musculoskeletal: No lower extremity edema.  Extremities warm and well perfused.  Left lower extremity with area of swelling to the medial/anterior lower leg approximately 10 cm inferior to the knee.  2+ DP pulses.  Normal motor strength and fine touch sensation distally.  FROM at ankle and knee.  Normal color to the distal lower leg.   Neurologic:  Normal speech and language. No gross focal neurologic deficits are appreciated.  Skin:  Skin is warm and dry. No rash noted. Psychiatric: Mood and affect are normal. Speech and behavior are normal.  ____________________________________________   LABS (all labs ordered are listed, but only abnormal results are displayed)  Labs Reviewed - No data to display ____________________________________________  EKG   ____________________________________________  RADIOLOGY  X-ray left hip/fib: No acute fracture [place] LLE: Pending  ____________________________________________   PROCEDURES  Procedure(s) performed: No  Procedures  Critical Care performed: No ____________________________________________   INITIAL IMPRESSION / ASSESSMENT AND PLAN / ED COURSE  Pertinent labs & imaging results that  were available during my care of the patient were reviewed by me and considered in my medical decision making (see chart for details).  82 year old female with PMH as noted below presents with left lower extremity injury after she turned and struck it on the edge of her recliner.  Patient has had significant swelling developed to the lower leg somewhat distal to the knee at the medial/anterior aspect.  No other injuries.  On exam, the left lower extremity is neuro/vascular intact.  She has 2+ DP pulse and intact sensation and motor distally.  The compartments are soft.  Presentation is consistent with hematoma likely due to some  subcutaneous or intramuscular small vessel with bleeding.  There is no evidence of compartment syndrome.  I will obtain an x-ray to rule out fracture, and a DVT study to rule out acute DVT.  If these are negative we will plan to observe for a few hours to make sure that the symptoms are not worsening and the patient does not have compartment syndrome.  If the symptoms are improving, anticipate discharge home.  ----------------------------------------- 11:38 PM on 02/16/2018 -----------------------------------------  I am signing the patient out to the oncoming physician Dr. Mable Paris.  Ultrasound is pending.  If it is negative, we will observe the patient for an additional 1 to 2 hours to verify that the swelling is improving and that there continues to be no evidence of compartment syndrome.  Likely discharge home if improving.  ____________________________________________   FINAL CLINICAL IMPRESSION(S) / ED DIAGNOSES  Final diagnoses:  Hematoma of left lower extremity, initial encounter      NEW MEDICATIONS STARTED DURING THIS VISIT:  New Prescriptions   No medications on file     Note:  This document was prepared using Dragon voice recognition software and may include unintentional dictation errors.    Arta Silence, MD 02/16/18 [id number]

## 2018-02-16 NOTE — ED Notes (Signed)
Pt is still in [place]

## 2018-02-16 NOTE — ED Triage Notes (Signed)
Pt lives in independent living at the [place] of [place].  Pt states that her left leg came off the recliner and that there was severe pain and when she looked there was swelling and bruising noted.  I can see swelling to left shin.  Pt has hx of varicose vein.  Pt describes severe pain with this. She declined fentanyl for ems and thus had no pain medication pta

## 2018-02-16 NOTE — ED Notes (Signed)
Increased swelling and bruising at this time.  Lower leg tight and swollen with bruising to shin and up into knee area (even behind knee)

## 2018-02-17 ENCOUNTER — Encounter: Payer: Self-pay | Admitting: Internal Medicine

## 2018-02-17 ENCOUNTER — Encounter
Admission: RE | Admit: 2018-02-17 | Discharge: 2018-02-17 | Disposition: A | Discharge status: Home / Self Care | Payer: Medicare Other | Source: Ambulatory Visit | Attending: Internal Medicine | Admitting: Internal Medicine

## 2018-02-17 DIAGNOSIS — S8012XA Contusion of left lower leg, initial encounter: Secondary | ICD-10-CM | POA: Diagnosis present

## 2018-02-17 LAB — CBC WITH DIFFERENTIAL/PLATELET
Basophils Absolute: 0.1 K/uL (ref 0–0.1)
Basophils Relative: 1 %
Eosinophils Absolute: 0.2 K/uL (ref 0–0.7)
Eosinophils Relative: 2 %
HCT: 34.6 % — ABNORMAL LOW (ref 35.0–47.0)
Hemoglobin: 11.9 g/dL — ABNORMAL LOW (ref 12.0–16.0)
Lymphocytes Relative: 23 %
Lymphs Abs: 2.2 K/uL (ref 1.0–3.6)
MCH: 30.8 pg (ref 26.0–34.0)
MCHC: 34.4 g/dL (ref 32.0–36.0)
MCV: 89.5 fL (ref 80.0–100.0)
Monocytes Absolute: 1 K/uL — ABNORMAL HIGH (ref 0.2–0.9)
Monocytes Relative: 10 %
Neutro Abs: 6.3 K/uL (ref 1.4–6.5)
Neutrophils Relative %: 64 %
Platelets: 202 K/uL (ref 150–440)
RBC: 3.87 MIL/uL (ref 3.80–5.20)
RDW: 13.4 % (ref 11.5–14.5)
WBC: 9.8 K/uL (ref 3.6–11.0)

## 2018-02-17 LAB — BASIC METABOLIC PANEL
Anion gap: 8 (ref 5–15)
BUN: 30 mg/dL — ABNORMAL HIGH (ref 6–20)
CO2: 28 mmol/L (ref 22–32)
CREATININE: 1.15 mg/dL — AB (ref 0.44–1.00)
Calcium: 9.5 mg/dL (ref 8.9–10.3)
Chloride: 103 mmol/L (ref 101–111)
GFR calc Af Amer: 51 mL/min — ABNORMAL LOW (ref >60)
GFR, EST NON AFRICAN AMERICAN: 44 mL/min — AB (ref >60)
GLUCOSE: 134 mg/dL — AB (ref 65–99)
Potassium: 3.8 mmol/L (ref 3.5–5.1)
Sodium: 139 mmol/L (ref 135–145)

## 2018-02-17 LAB — COMPREHENSIVE METABOLIC PANEL WITH GFR
ALT: 13 U/L — ABNORMAL LOW (ref 14–54)
AST: 19 U/L (ref 15–41)
Albumin: 3.5 g/dL (ref 3.5–5.0)
Alkaline Phosphatase: 52 U/L (ref 38–126)
Anion gap: 7 (ref 5–15)
BUN: 23 mg/dL — ABNORMAL HIGH (ref 6–20)
CO2: 27 mmol/L (ref 22–32)
Calcium: 9.1 mg/dL (ref 8.9–10.3)
Chloride: 107 mmol/L (ref 101–111)
Creatinine, Ser: 0.84 mg/dL (ref 0.44–1.00)
GFR calc Af Amer: >60 mL/min (ref >60)
GFR calc non Af Amer: >60 mL/min (ref >60)
Glucose, Bld: 129 mg/dL — ABNORMAL HIGH (ref 65–99)
Potassium: 3.5 mmol/L (ref 3.5–5.1)
Sodium: 141 mmol/L (ref 135–145)
Total Bilirubin: 0.5 mg/dL (ref 0.3–1.2)
Total Protein: 5.5 g/dL — ABNORMAL LOW (ref 6.5–8.1)

## 2018-02-17 LAB — CBC
HCT: 36.5 % (ref 35.0–47.0)
Hemoglobin: 12.2 g/dL (ref 12.0–16.0)
MCH: 30.1 pg (ref 26.0–34.0)
MCHC: 33.6 g/dL (ref 32.0–36.0)
MCV: 89.8 fL (ref 80.0–100.0)
PLATELETS: 196 10*3/uL (ref 150–440)
RBC: 4.06 MIL/uL (ref 3.80–5.20)
RDW: 13.6 % (ref 11.5–14.5)
WBC: 8.2 10*3/uL (ref 3.6–11.0)

## 2018-02-17 LAB — PROTIME-INR
INR: 0.99
Prothrombin Time: 13 seconds (ref 11.4–15.2)

## 2018-02-17 LAB — CK: CK TOTAL: 58 U/L (ref 38–234)

## 2018-02-17 MED ORDER — ASPIRIN EC 81 MG PO TBEC
81.0000 mg | DELAYED_RELEASE_TABLET | Freq: Every day | ORAL | Status: DC
  Administered 2018-02-17 – 2018-02-20 (×4): 81 mg via ORAL
  Filled 2018-02-17 (×4): qty 1

## 2018-02-17 MED ORDER — ONDANSETRON HCL 4 MG/2ML IJ SOLN: 4.0000 mg | Freq: Four times a day (QID) | INTRAMUSCULAR | Status: DC | PRN

## 2018-02-17 MED ORDER — SENNOSIDES-DOCUSATE SODIUM 8.6-50 MG PO TABS: 1.0000 | ORAL_TABLET | Freq: Every evening | ORAL | Status: DC | PRN

## 2018-02-17 MED ORDER — MORPHINE SULFATE (PF) 4 MG/ML IV SOLN
INTRAVENOUS | Status: AC
  Filled 2018-02-17: qty 1

## 2018-02-17 MED ORDER — HYDROMORPHONE HCL 1 MG/ML IJ SOLN: 0.5000 mg | INTRAMUSCULAR | Status: DC | PRN

## 2018-02-17 MED ORDER — CLONIDINE HCL 0.1 MG PO TABS
0.1000 mg | ORAL_TABLET | Freq: Two times a day (BID) | ORAL | Status: DC
  Administered 2018-02-17 (×2): 0.1 mg via ORAL
  Filled 2018-02-17 (×3): qty 1

## 2018-02-17 MED ORDER — KETOROLAC TROMETHAMINE 30 MG/ML IJ SOLN
15.0000 mg | Freq: Three times a day (TID) | INTRAMUSCULAR | Status: DC | PRN
  Administered 2018-02-19: 15 mg via INTRAVENOUS
  Filled 2018-02-17: qty 1

## 2018-02-17 MED ORDER — HYDROMORPHONE HCL 1 MG/ML IJ SOLN: 1.0000 mg | INTRAMUSCULAR | Status: DC | PRN

## 2018-02-17 MED ORDER — SODIUM CHLORIDE 0.9 % IV SOLN
INTRAVENOUS | Status: DC
  Administered 2018-02-17 – 2018-02-18 (×2): via INTRAVENOUS

## 2018-02-17 MED ORDER — PANTOPRAZOLE SODIUM 40 MG PO TBEC
40.0000 mg | DELAYED_RELEASE_TABLET | Freq: Every day | ORAL | Status: DC
  Administered 2018-02-17 – 2018-02-20 (×4): 40 mg via ORAL
  Filled 2018-02-17 (×4): qty 1

## 2018-02-17 MED ORDER — SERTRALINE HCL 50 MG PO TABS
200.0000 mg | ORAL_TABLET | Freq: Every day | ORAL | Status: DC
  Administered 2018-02-17 – 2018-02-19 (×3): 200 mg via ORAL
  Filled 2018-02-17 (×3): qty 4

## 2018-02-17 MED ORDER — ONDANSETRON HCL 4 MG/2ML IJ SOLN
4.0000 mg | Freq: Once | INTRAMUSCULAR | Status: AC
  Administered 2018-02-17: 4 mg via INTRAVENOUS
  Filled 2018-02-17: qty 2

## 2018-02-17 MED ORDER — FERROUS SULFATE 325 (65 FE) MG PO TABS
325.0000 mg | ORAL_TABLET | Freq: Two times a day (BID) | ORAL | Status: DC
  Administered 2018-02-17 – 2018-02-20 (×7): 325 mg via ORAL
  Filled 2018-02-17 (×7): qty 1

## 2018-02-17 MED ORDER — ADULT MULTIVITAMIN W/MINERALS CH
1.0000 | ORAL_TABLET | Freq: Every day | ORAL | Status: DC
  Administered 2018-02-17 – 2018-02-20 (×4): 1 via ORAL
  Filled 2018-02-17 (×4): qty 1

## 2018-02-17 MED ORDER — NITROGLYCERIN 0.4 MG/HR TD PT24
0.4000 mg | MEDICATED_PATCH | Freq: Every day | TRANSDERMAL | Status: DC
  Administered 2018-02-17 – 2018-02-20 (×3): 0.4 mg via TRANSDERMAL
  Filled 2018-02-17 (×4): qty 1

## 2018-02-17 MED ORDER — DILTIAZEM HCL 60 MG PO TABS
30.0000 mg | ORAL_TABLET | Freq: Three times a day (TID) | ORAL | Status: DC
  Administered 2018-02-17 – 2018-02-20 (×8): 30 mg via ORAL
  Filled 2018-02-17 (×3): qty 1
  Filled 2018-02-17 (×2): qty 0.5
  Filled 2018-02-17 (×2): qty 1
  Filled 2018-02-17 (×5): qty 0.5
  Filled 2018-02-17: qty 1
  Filled 2018-02-17 (×3): qty 0.5
  Filled 2018-02-17 (×2): qty 1
  Filled 2018-02-17 (×2): qty 0.5

## 2018-02-17 MED ORDER — BISACODYL 5 MG PO TBEC: 5.0000 mg | DELAYED_RELEASE_TABLET | Freq: Two times a day (BID) | ORAL | Status: DC | PRN

## 2018-02-17 MED ORDER — KETOROLAC TROMETHAMINE 30 MG/ML IJ SOLN
15.0000 mg | Freq: Once | INTRAMUSCULAR | Status: AC
  Administered 2018-02-17: 15 mg via INTRAVENOUS
  Filled 2018-02-17: qty 1

## 2018-02-17 MED ORDER — ONDANSETRON HCL 4 MG PO TABS: 4.0000 mg | ORAL_TABLET | Freq: Four times a day (QID) | ORAL | Status: DC | PRN

## 2018-02-17 MED ORDER — CALCIUM-VITAMIN D 500-200 MG-UNIT PO TABS
1.0000 | ORAL_TABLET | Freq: Every day | ORAL | Status: DC
  Administered 2018-02-17 – 2018-02-20 (×4): 1 via ORAL
  Filled 2018-02-17 (×4): qty 1

## 2018-02-17 MED ORDER — MORPHINE SULFATE (PF) 4 MG/ML IV SOLN
4.0000 mg | Freq: Once | INTRAVENOUS | Status: AC
  Administered 2018-02-17: 4 mg via INTRAVENOUS

## 2018-02-17 MED ORDER — ACETAMINOPHEN 650 MG RE SUPP: 650.0000 mg | Freq: Four times a day (QID) | RECTAL | Status: DC | PRN

## 2018-02-17 MED ORDER — BUPROPION HCL ER (SR) 150 MG PO TB12
150.0000 mg | ORAL_TABLET | Freq: Two times a day (BID) | ORAL | Status: DC
  Administered 2018-02-17 – 2018-02-20 (×7): 150 mg via ORAL
  Filled 2018-02-17 (×8): qty 1

## 2018-02-17 MED ORDER — MORPHINE SULFATE (PF) 4 MG/ML IV SOLN
4.0000 mg | Freq: Once | INTRAVENOUS | Status: AC
  Administered 2018-02-17: 4 mg via INTRAVENOUS
  Filled 2018-02-17: qty 1

## 2018-02-17 MED ORDER — ACETAMINOPHEN 325 MG PO TABS
650.0000 mg | ORAL_TABLET | Freq: Four times a day (QID) | ORAL | Status: DC | PRN
  Administered 2018-02-18 – 2018-02-19 (×2): 650 mg via ORAL
  Filled 2018-02-17 (×2): qty 2

## 2018-02-17 MED ORDER — LOSARTAN POTASSIUM-HCTZ 50-12.5 MG PO TABS: 1.0000 | ORAL_TABLET | Freq: Every day | ORAL | Status: DC

## 2018-02-17 MED ORDER — HYDROCHLOROTHIAZIDE 12.5 MG PO CAPS
12.5000 mg | ORAL_CAPSULE | Freq: Every day | ORAL | Status: DC
  Administered 2018-02-17: 12.5 mg via ORAL
  Filled 2018-02-17 (×2): qty 1

## 2018-02-17 MED ORDER — LOSARTAN POTASSIUM 50 MG PO TABS
50.0000 mg | ORAL_TABLET | Freq: Every day | ORAL | Status: DC
  Administered 2018-02-17 – 2018-02-20 (×3): 50 mg via ORAL
  Filled 2018-02-17 (×4): qty 1

## 2018-02-17 MED ORDER — POTASSIUM CHLORIDE CRYS ER 20 MEQ PO TBCR
40.0000 meq | EXTENDED_RELEASE_TABLET | Freq: Every day | ORAL | Status: AC
  Filled 2018-02-17: qty 2

## 2018-02-17 NOTE — Consult Note (Signed)
ORTHOPAEDIC CONSULTATION  REQUESTING PHYSICIAN: Nicholes Mango, MD  Chief Complaint: Left leg pain  HPI: Cindy Fitzpatrick is a 82 y.o. female who complains of  Left leg pain after hitting it on the edge of her recliner yesterday. Developed swelling and pain and was admitted through the ER last night for pain control.  US showed a hematoma without signs of compartment swelling. Pain and swelling are better today she feels. She is OOB in chair today and appears comfortable.She is not on anticoagulants at home.  Past Medical History:  Diagnosis Date  . Arthritis   . Blood in stool   . Cancer ([place])    skin  . Chicken pox    SHINGLES TWICE  . Colon polyps   . Depression   . Diverticulitis   . GERD (gastroesophageal reflux disease)   . Hay fever   . Heart disease   . Heart murmur   . Hyperlipidemia   . UTI (urinary tract infection)   . Varicose veins of both lower extremities    Past Surgical History:  Procedure Laterality Date  . HERNIA REPAIR    . KNEE SURGERY     Social History   Socioeconomic History  . Marital status: Widowed    Spouse name: Not on file  . Number of children: Not on file  . Years of education: Not on file  . Highest education level: Not on file  Occupational History  . Not on file  Social Needs  . Financial resource strain: Not on file  . Food insecurity:    Worry: Not on file    Inability: Not on file  . Transportation needs:    Medical: Not on file    Non-medical: Not on file  Tobacco Use  . Smoking status: Never Smoker  . Smokeless tobacco: Never Used  Substance and Sexual Activity  . Alcohol use: No    Alcohol/week: 0.0 oz  . Drug use: No  . Sexual activity: Never  Lifestyle  . Physical activity:    Days per week: Not on file    Minutes per session: Not on file  . Stress: Not on file  Relationships  . Social connections:    Talks on phone: Not on file    Gets together: Not on file    Attends religious service: Not on file   Active member of club or organization: Not on file    Attends meetings of clubs or organizations: Not on file    Relationship status: Not on file  Other Topics Concern  . Not on file  Social History Narrative  . Not on file   Family History  Problem Relation Age of Onset  . Stroke Mother   . Heart attack Father    Allergies  Allergen Reactions  . Codeine     Other reaction(s): Headache  . Hydrocodone-Acetaminophen Nausea And Vomiting  . Meperidine     Other reaction(s): Dizziness  . Niacin Er Other (See Comments)    Patient doesn't remember  . Other Nausea Only  . Propoxyphene     Other reaction(s): Dizziness  . Solifenacin Other (See Comments)  . Sulfa Antibiotics Other (See Comments)    Patient doesn' t remember  . Fish-Derived Products Rash    Weakness  . Nabumetone Rash  . Oxycodone-Acetaminophen Rash    Couldn't think or remember anything   Prior to Admission medications   Medication Sig Start Date End Date Taking? Authorizing Provider  acetaminophen (TYLENOL) 650 MG CR  tablet Take 650 mg by mouth every 8 (eight) hours as needed for pain.   Yes [provider]  aspirin EC 81 MG tablet Take 81 mg by mouth daily.   Yes [provider]  bisacodyl (DULCOLAX) 5 MG EC tablet Take 5 mg by mouth 2 (two) times daily as needed for moderate constipation.   Yes [provider]  buPROPion (WELLBUTRIN SR) 150 MG 12 hr tablet TAKE 1 TABLET BY MOUTH TWICE DAILY 12/23/17  Yes [name], MD  Calcium Carb-Cholecalciferol 600-800 MG-UNIT TABS Take 1 tablet by mouth daily.    Yes [provider]  cloNIDine (CATAPRES) 0.1 MG tablet TAKE 1 TABLET BY MOUTH TWICE DAILY 01/05/18  Yes [name], MD  diltiazem (CARDIZEM) 30 MG tablet TAKE 1 TABLET THREE TIMES DAILY 12/23/17  Yes [name], MD  ferrous sulfate 325 (65 FE) MG tablet Take 1 tablet (325 mg total) by mouth 2 (two) times daily with a meal. 04/23/16  Yes Vaughan Basta, MD   losartan-hydrochlorothiazide (HYZAAR) 50-12.5 MG tablet TAKE 1 TABLET EVERY DAY 08/12/17  Yes [name], MD  Multiple Vitamins-Minerals (CENTRUM SILVER PO) Take 1 tablet by mouth daily.   Yes [provider]  nitroGLYCERIN (NITRODUR - DOSED IN MG/24 HR) 0.4 mg/hr patch APPLY ONE PATCH TO SKIN ONCE DAILY, LEAVE ON 12 TO 14 HOURS THEN REMOVE FOR 10 TO 12 HOURS BEFORE APPLYING NEXT PATCH 07/29/17  Yes [name], MD  Omega-3 Fatty Acids (FISH OIL) 1200 MG CAPS Take 1 capsule by mouth daily.    Yes [provider]  omeprazole (PRILOSEC) 20 MG capsule Take 20 mg by mouth 2 (two) times daily before a meal.   Yes [provider]  Polyethyl Glycol-Propyl Glycol 0.4-0.3 % SOLN Apply 1 drop to eye as needed (dry eyes).    Yes [provider]  potassium chloride (K-DUR,KLOR-CON) 10 MEQ tablet TAKE 1 TABLET BY MOUTH DAILY 06/03/17  Yes [name], MD  sertraline (ZOLOFT) 100 MG tablet TAKE 2 TABLETS BY MOUTH AT BEDTIME 02/13/18  Yes [name], MD   Dg Tibia/fibula Left  Result Date: 02/16/2018 CLINICAL DATA:  Pain and swelling LEFT lower extremity. EXAM: LEFT TIBIA AND FIBULA - 2 VIEW COMPARISON:  None FINDINGS: There is no evidence of fracture or other focal bone lesions. Status post total knee arthroplasty, no periprosthetic lucency. Osteopenia. Pretibial phleboliths. IMPRESSION: No acute osseous process. Osteopenia decreases sensitivity for acute nondisplaced fractures. Electronically Signed   By: Elon Alas M.D.   On: 02/16/2018 22:47   US Venous Img Lower Unilateral Left  Result Date: 02/17/2018 CLINICAL DATA:  Lower leg swelling.  History of vein stripping. EXAM: LEFT LOWER EXTREMITY VENOUS DOPPLER ULTRASOUND TECHNIQUE: Lacour-scale sonography with graded compression, as well as color Doppler and duplex ultrasound were performed to evaluate the lower extremity deep venous systems from the level of the common femoral vein and including  the common femoral, femoral, profunda femoral, popliteal and calf veins including the posterior tibial, peroneal and gastrocnemius veins when visible. The superficial great saphenous vein was also interrogated. Spectral Doppler was utilized to evaluate flow at rest and with distal augmentation maneuvers in the common femoral, femoral and popliteal veins. COMPARISON:  Same day radiographs. FINDINGS: Contralateral Common Femoral Vein: Respiratory phasicity is normal and symmetric with the symptomatic side. No evidence of thrombus. Normal compressibility. Common Femoral Vein: No evidence of thrombus. Normal compressibility, respiratory phasicity and response to augmentation. Saphenofemoral Junction: Not  visualized possibly from vein stripping. Profunda Femoral Vein: No evidence of thrombus. Normal compressibility and flow on color Doppler imaging. Femoral Vein: No evidence of thrombus. Normal compressibility, respiratory phasicity and response to augmentation. Popliteal Vein: No evidence of thrombus. Normal compressibility, respiratory phasicity and response to augmentation. Calf Veins: Nonvisualized peroneal and posterior tibial veins. Superficial Great Saphenous Vein: No evidence of thrombus. Normal compressibility. Venous Reflux:  None. Other Findings: Hypoechoic avascular 16.7 x 4.7 x 8.5 cm subcutaneous collection compatible with a soft tissue hematoma along posteromedial calf. IMPRESSION: Subcutaneous hypoechoic avascular abnormality along the the posteromedial calf measuring 16.7 x 4.7 x 8.5 cm compatible with an organizing soft tissue hematoma. No DVT. Electronically Signed   By: Ashley Royalty M.D.   On: 02/17/2018 00:22    Positive ROS: All other systems have been reviewed and were otherwise negative with the exception of those mentioned in the HPI and as above.  Physical Exam: General: Alert, no acute distress Cardiovascular: No pedal edema Respiratory: No cyanosis, no use of accessory musculature GI:  No organomegaly, abdomen is soft and non-tender Skin: No lesions in the area of chief complaint Neurologic: Sensation intact distally Psychiatric: Patient is competent for consent with normal mood and affect Lymphatic: No axillary or cervical lymphadenopathy  MUSCULOSKELETAL: Left calf swollen and ecchymotic. Skin intact. No compartment swelling. Normal pulses and movement of foot and ankle. Homan's negative.  Mild bruising behind left knee.No other injury noted.   Assessment: Hematoma left calf  Plan: Ace and Ice and elevation WBAT with walker No need for intervention at this time. Continue to observe and RTC 1 week.    Park Breed, MD [phone]   02/17/2018 6:41 PM

## 2018-02-17 NOTE — ED Provider Notes (Signed)
Patient's ultrasound is back showing a 16 x 8 x 5 cm hematoma.  She is required 3 doses of morphine at this point in addition to Toradol and has significant pain.  CKs fortunately reassuring and she is neurovascularly intact with soft compartments and I do not suspect compartment syndrome at this time.  Regardless given her severe pain and high IV pain requirement she does require inpatient admission for serial evaluations, pain control, and possible orthopedic evaluation for drainage of the hematoma.  I discussed with the patient family who verbalized understanding and agree with the plan.  I then discussed with the hospitalist who has graciously agreed to admit the patient to his service.   Darel Hong, MD 02/17/18 [phone]

## 2018-02-17 NOTE — Care Management Note (Signed)
Case Management Note  Patient Details  Name: Cindy Fitzpatrick MRN: [id number] Date of Birth: 04/09/1936  Subjective/Objective: Admitted to [facility] with the diagnosis of hematoma of left lower leg under observation status. Son is [name] [phone])  Lives at [facility] since [facility]. Last seen Dr Biagio Quint 11/2017. Prescriptions are filled at [facility]. No home Health. No skilled nursing. No home oxygen. Uses a rolling walker and cane. Call system in place at [facility]. States frequent falls. Appetite is better.                   Action/Plan: Will continue for discharge plans   Expected Discharge Date:                  Expected Discharge Plan:     In-House Referral:     Discharge planning Services     Post Acute Care Choice:    Choice offered to:     DME Arranged:    DME Agency:     HH Arranged:    HH Agency:     Status of Service:     If discussed at [employer] of Stay Meetings, dates discussed:    Additional Comments:  Shelbie Ammons, RN MSN CCM Care Management [phone] 02/17/2018, 9:31 AM

## 2018-02-17 NOTE — Care Management Obs Status (Signed)
[place] NOTIFICATION   Patient Details  Name: Cindy Fitzpatrick MRN: [id number] Date of Birth: 05-23-1936   Medicare Observation Status Notification Given:  Yes    Shelbie Ammons, RN 02/17/2018, 9:28 AM

## 2018-02-17 NOTE — H&P (Signed)
[facility] [place] NAME: Cindy Fitzpatrick    MR#:  [id number]  DATE OF BIRTH:  06/30/1936  DATE OF ADMISSION:  02/16/2018  PRIMARY CARE PHYSICIAN: [name], MD   REQUESTING/REFERRING PHYSICIAN: Arta Silence, MD  CHIEF COMPLAINT:   Chief Complaint  Patient presents with  . Leg Pain    HISTORY OF PRESENT ILLNESS:  Cindy Fitzpatrick  is a 82 y.o. female with a known history of HTN, HLD, GERD, IBS, RA, DJD/OA, anx/dep/BPD, Hx diverticulitis, Hx shingles who p/w 1d Hx LLE trauma and pain. Pt received IV Morphine shortly prior to my Hx/examination; she is AAOx3 but states she is feeling "loopy" and is having difficulty answering questions. She also appears very anxious, and goes off on tangents throughout our conversation. Based on what I am able to glean, the pt was sitting in her recliner on Thursday (02/16/2018) evening. Her left leg slid off the recliner leg rest and her medial/posterior calf knocked up against the side of the recliner. She endorses immediate onset of pain, with hematoma and progressive swelling over the course of the evening. She endorses pain with leg movement and attempted weight bearing. She characterizes the pain as a tightness/pulling sensation along the posteromedial aspect of the L calf. She states that a nurse working [place] [facility] [place] was called, and based on the pain, progressive swelling and ambulatory failure, EMS was called and pt was brought to the hospital by ambulance. The pt states she ambulates w/ a walker at baseline, and endorses unsteady gait and falls since her 71s. She states she has seen multiple specialists regarding her ambulation, including Rheumatology and Neurology, but states her ambulation has been progressively deteriorating nonetheless. Pt is very anxious, but is otherwise w/o complaint. Denies F/C/N/V/D/AP, CP/SOB, palpitations, diaphoresis, night sweats, rigors, cough, hemoptysis,  wheezing, blurred vision, vertigo, LH/LOC, urinary symptoms. Pt is on ASA81, but is not on full-dose therapeutic anticoagulation.  PAST MEDICAL HISTORY:   Past Medical History:  Diagnosis Date  . Arthritis   . Blood in stool   . Cancer ([place])    skin  . Chicken pox    SHINGLES TWICE  . Colon polyps   . Depression   . Diverticulitis   . GERD (gastroesophageal reflux disease)   . Hay fever   . Heart disease   . Heart murmur   . Hyperlipidemia   . UTI (urinary tract infection)   . Varicose veins of both lower extremities     PAST SURGICAL HISTORY:   Past Surgical History:  Procedure Laterality Date  . HERNIA REPAIR    . KNEE SURGERY      SOCIAL HISTORY:   Social History   Tobacco Use  . Smoking status: Never Smoker  . Smokeless tobacco: Never Used  Substance Use Topics  . Alcohol use: No    Alcohol/week: 0.0 oz    FAMILY HISTORY:   Family History  Problem Relation Age of Onset  . Stroke Mother   . Heart attack Father     DRUG ALLERGIES:   Allergies  Allergen Reactions  . Codeine     Other reaction(s): Headache  . Hydrocodone-Acetaminophen Nausea And Vomiting  . Meperidine     Other reaction(s): Dizziness  . Niacin Er Other (See Comments)    Patient doesn't remember  . Other Nausea Only  . Propoxyphene     Other reaction(s): Dizziness  . Solifenacin Other (See Comments)  . Sulfa Antibiotics Other (  See Comments)    Patient doesn' t remember  . Fish-Derived Products Rash    Weakness  . Nabumetone Rash  . Oxycodone-Acetaminophen Rash    Couldn't think or remember anything    REVIEW OF SYSTEMS:   Review of Systems  Constitutional: Negative for chills, diaphoresis, fever, malaise/fatigue and weight loss.  HENT: Negative for congestion, hearing loss, sinus pain, sore throat and tinnitus.   Eyes: Negative for blurred vision, double vision and photophobia.  Respiratory: Negative for cough, hemoptysis, sputum production, shortness of breath and  wheezing.   Cardiovascular: Positive for leg swelling (+) LLE swelling (2/2 trauma). Negative for chest pain, palpitations, orthopnea, claudication and PND.  Gastrointestinal: Negative for abdominal pain, blood in stool, constipation, diarrhea, heartburn, melena, nausea and vomiting.  Genitourinary: Negative for dysuria, frequency, hematuria and urgency.  Musculoskeletal: Positive for falls. Negative for back pain, joint pain, myalgias and neck pain.  Skin: Negative for itching and rash.  Neurological: Negative for dizziness, tingling, tremors, sensory change, speech change, focal weakness, seizures, loss of consciousness, weakness and headaches.    MEDICATIONS AT HOME:   Prior to Admission medications   Medication Sig Start Date End Date Taking? Authorizing Provider  acetaminophen (TYLENOL) 650 MG CR tablet Take 650 mg by mouth every 8 (eight) hours as needed for pain.    [provider]  aspirin EC 81 MG tablet Take 81 mg by mouth daily.    [provider]  bisacodyl (DULCOLAX) 5 MG EC tablet Take 5 mg by mouth 2 (two) times daily as needed for moderate constipation.    [provider]  buPROPion [facility] SR) 150 MG 12 hr tablet TAKE 1 TABLET BY MOUTH TWICE DAILY 12/23/17   [name], MD  Calcium Carb-Cholecalciferol 600-800 MG-UNIT TABS Take 1 tablet by mouth daily.     [provider]  cloNIDine (CATAPRES) 0.1 MG tablet TAKE 1 TABLET BY MOUTH TWICE DAILY 01/05/18   [name], MD  diltiazem (CARDIZEM) 30 MG tablet TAKE 1 TABLET THREE TIMES DAILY 12/23/17   [name], MD  ferrous sulfate 325 (65 FE) MG tablet Take 1 tablet (325 mg total) by mouth 2 (two) times daily with a meal. 04/23/16   Vaughan Basta, MD  losartan-hydrochlorothiazide [facility]) 50-12.5 MG tablet TAKE 1 TABLET EVERY DAY 08/12/17   [name], MD  Multiple Vitamins-Minerals (CENTRUM SILVER PO) Take 1 tablet by mouth daily.    [provider]    nitroGLYCERIN (NITRODUR - DOSED IN MG/24 HR) 0.4 mg/hr patch APPLY ONE PATCH TO SKIN ONCE DAILY, LEAVE ON 12 TO 14 HOURS THEN REMOVE FOR 10 TO 12 HOURS BEFORE APPLYING NEXT PATCH 05/12/17   Cook, Jayce G, DO  nitroGLYCERIN (NITRODUR - DOSED IN MG/24 HR) 0.4 mg/hr patch APPLY ONE PATCH TO SKIN ONCE DAILY, LEAVE ON 12 TO 14 HOURS THEN REMOVE FOR 10 TO 12 HOURS BEFORE APPLYING NEXT PATCH 07/29/17   [name], MD  nitroGLYCERIN (NITRODUR - DOSED IN MG/24 HR) 0.4 mg/hr patch APPLY 1 PATCH TO SKIN ONCE DAILY, LEAVE ON 12 TO 14 HOURS THEN REMOVEFOR 10 TO 12 HOURS BEFORE APPLYING NEXT PATCH. 08/12/17   [name], MD  nitroGLYCERIN (NITRODUR - DOSED IN MG/24 HR) 0.4 mg/hr patch APPLY 1 PATCH DAILY. LEAVE ON 12-14 HOURS AND REMOVE FOR 10-12 HOURS BEFORE APPLYING NEXT PATCH. 02/13/18   [name], MD  nitroGLYCERIN (NITRODUR - DOSED IN MG/24 HR) 0.4 mg/hr patch APPLY 1 PATCH DAILY LEAVE  ON 12 TO 14 HOURS AND REMOVE FOR 10 TO 12 HOURS BEFORE APPLYING NEXT PATCH 02/13/18   [name], MD  Omega-3 Fatty Acids (FISH OIL) 1200 MG CAPS Take 1 capsule by mouth daily.     [provider]  omeprazole (PRILOSEC) 20 MG capsule Take 20 mg by mouth 2 (two) times daily before a meal.    [provider]  Polyethyl Glycol-Propyl Glycol 0.4-0.3 % SOLN Apply 1 drop to eye as needed (dry eyes).     [provider]  potassium chloride (K-DUR) 10 MEQ tablet Take 1 tablet (10 mEq total) by mouth daily. 08/06/16   [name], MD  potassium chloride (K-DUR,KLOR-CON) 10 MEQ tablet TAKE 1 TABLET BY MOUTH DAILY 06/03/17   [name], MD  sertraline (ZOLOFT) 100 MG tablet TAKE 2 TABLETS BY MOUTH AT BEDTIME 02/13/18   [name], MD      VITAL SIGNS:  Blood pressure 112/71, pulse 88, temperature 98.4 F (36.9 C), temperature source Oral, resp. rate 16, weight 65.3 kg (144 lb), SpO2 100 %.  PHYSICAL EXAMINATION:  Physical Exam  Constitutional: She is  oriented to person, place, and time. She appears well-developed and well-nourished. She is active and cooperative. She is easily aroused.  Non-toxic appearance. She does not have a sickly appearance. She does not appear ill. No distress.  HENT:  Head: Normocephalic and atraumatic.  Eyes: Conjunctivae and EOM are normal. No scleral icterus.  Neck: Neck supple. No JVD present. No thyromegaly present.  Cardiovascular: Normal rate, regular rhythm, S1 normal and S2 normal.  No extrasystoles are present. Exam reveals no gallop, no S3, no S4, no distant heart sounds and no friction rub.  Murmur heard.  Systolic murmur is present with a grade of 3/6. Pulmonary/Chest: Effort normal and breath sounds normal. No stridor. No respiratory distress. She has no wheezes. She has no rhonchi. She has no rales.  Abdominal: Soft. Bowel sounds are normal. She exhibits no distension. There is no tenderness. There is no rebound and no guarding.  Musculoskeletal: She exhibits edema (+) L posteriomedial calf hematoma/swelling, (+) TTP, tenderness and deformity.       Left lower leg: She exhibits tenderness and swelling.  Lymphadenopathy:    She has no cervical adenopathy.  Neurological: She is alert, oriented to person, place, and time and easily aroused.  Skin: Skin is warm and dry. No rash noted. She is not diaphoretic. No erythema.  Psychiatric: Judgment and thought content normal. Her mood appears anxious. Her affect is not angry, not blunt, not labile and not inappropriate. Her speech is delayed and tangential. Her speech is not rapid and/or pressured and not slurred. She is not agitated, not aggressive, not hyperactive, not slowed, not withdrawn, not actively hallucinating and not combative. Thought content is not paranoid and not delusional. She does not express impulsivity or inappropriate judgment. She does not exhibit a depressed mood. She is communicative. She is attentive.    LABORATORY PANEL:   CBC Recent  Labs  Lab 02/17/18 0148  WBC 9.8  HGB 11.9*  HCT 34.6*  PLT 202   ------------------------------------------------------------------------------------------------------------------  Chemistries  Recent Labs  Lab 02/17/18 0148  NA 141  K 3.5  CL 107  CO2 27  GLUCOSE 129*  BUN 23*  CREATININE 0.84  CALCIUM 9.1  AST 19  ALT 13*  ALKPHOS 52  BILITOT 0.5   ------------------------------------------------------------------------------------------------------------------  Cardiac Enzymes No results for input(s): TROPONINI in the last 168 hours. ------------------------------------------------------------------------------------------------------------------  RADIOLOGY:  Dg Tibia/fibula Left  Result Date: 02/16/2018 CLINICAL DATA:  Pain and swelling LEFT lower extremity. EXAM: LEFT TIBIA AND FIBULA - 2 VIEW COMPARISON:  None FINDINGS: There is no evidence of fracture or other focal bone lesions. Status post total knee arthroplasty, no periprosthetic lucency. Osteopenia. Pretibial phleboliths. IMPRESSION: No acute osseous process. Osteopenia decreases sensitivity for acute nondisplaced fractures. Electronically Signed   By: Elon Alas M.D.   On: 02/16/2018 22:47   US Venous Img Lower Unilateral Left  Result Date: 02/17/2018 CLINICAL DATA:  Lower leg swelling.  History of vein stripping. EXAM: LEFT LOWER EXTREMITY VENOUS DOPPLER ULTRASOUND TECHNIQUE: Ellers-scale sonography with graded compression, as well as color Doppler and duplex ultrasound were performed to evaluate the lower extremity deep venous systems from the level of the common femoral vein and including the common femoral, femoral, profunda femoral, popliteal and calf veins including the posterior tibial, peroneal and gastrocnemius veins when visible. The superficial great saphenous vein was also interrogated. Spectral Doppler was utilized to evaluate flow at rest and with distal augmentation maneuvers in the common  femoral, femoral and popliteal veins. COMPARISON:  Same day radiographs. FINDINGS: Contralateral Common Femoral Vein: Respiratory phasicity is normal and symmetric with the symptomatic side. No evidence of thrombus. Normal compressibility. Common Femoral Vein: No evidence of thrombus. Normal compressibility, respiratory phasicity and response to augmentation. Saphenofemoral Junction: Not visualized possibly from vein stripping. Profunda Femoral Vein: No evidence of thrombus. Normal compressibility and flow on color Doppler imaging. Femoral Vein: No evidence of thrombus. Normal compressibility, respiratory phasicity and response to augmentation. Popliteal Vein: No evidence of thrombus. Normal compressibility, respiratory phasicity and response to augmentation. Calf Veins: Nonvisualized peroneal and posterior tibial veins. Superficial Great Saphenous Vein: No evidence of thrombus. Normal compressibility. Venous Reflux:  None. Other Findings: Hypoechoic avascular 16.7 x 4.7 x 8.5 cm subcutaneous collection compatible with a soft tissue hematoma along posteromedial calf. IMPRESSION: Subcutaneous hypoechoic avascular abnormality along the the posteromedial calf measuring 16.7 x 4.7 x 8.5 cm compatible with an organizing soft tissue hematoma. No DVT. Electronically Signed   By: Ashley Royalty M.D.   On: 02/17/2018 00:22   IMPRESSION AND PLAN:   A/P: 3F L calf trauma w/ hematoma, intractable pain, ambulatory failure.  1.) LLE trauma/hematoma/pain: Pt p/w 1d Hx LLE trauma, hematoma. Per narrative, mechanical injury w/o fall, no other injury. Admit for intractable pain, ambulatory failure. On ASA, not on full-dose therapeutic anticoagulation. LLE XR (-) acute osseous process. LLE U/S (+) "Subcutaneous hypoechoic avascular abnormality along the the posteromedial calf measuring 16.7 x 4.7 x 8.5 cm compatible with an organizing soft tissue hematoma." INR WNL. Hgb 11.9, CPK 58. Do not suspect compartment syndrome. Pain ctrl,  ambulation as tolerated.  2.) Hyperglycemia: Glucose 129. HbA1c 5.6% as of 11/30/2017.  3.) HTN: c/w Clonidine, Cardizem, Hyzaar, NitroDur.  4.) GERD: Protonix as formulary substitution for home Prilosec.  5.) Anx/dep/BPD: c/w Wellbutrin, Zoloft.  6.) FEN/GI: Cardiac diet, Protonix.  7.) DVT PPx: RLE SCD, no pharmacological PPx (2/2 hematoma).  8.) Code status: Full code.  9.) Disposition: Observation, pt expected to stay < 2 midnights.   All the records are reviewed and case discussed with ED provider. Management plans discussed with the patient, family and they are in agreement.  CODE STATUS: Full code.  TOTAL TIME TAKING CARE OF THIS PATIENT: 75 minutes.    Arta Silence M.D on 02/17/2018 at 3:11 AM  Between 7am to 6pm - Pager - [phone]  After 6pm go  to [email or web address] - [employer] [facility]  Office  [phone]  CC: Primary care physician; [name], MD   Note: This dictation was prepared with Dragon dictation along with smaller phrase technology. Any transcriptional errors that result from this process are unintentional.

## 2018-02-17 NOTE — NC FL2 (Addendum)
[place] LEVEL OF CARE SCREENING TOOL     IDENTIFICATION  Patient Name: Cindy Fitzpatrick Birthdate: March 28, 1936 Sex: female Admission Date (Current Location): 02/16/2018  [place] and [place] Number:  Engineering geologist and Address:  [facility], [address], [place], [place] [address]      Provider Number: [id number]  Attending Physician Name and Address:  Nicholes Mango, MD  Relative Name and Phone Number:       Current Level of Care: Hospital Recommended Level of Care: [place] Prior Approval Number:    Date Approved/Denied:   PASRR Number:  [id number] E expires on 03/19/18    Discharge Plan: SNF    Current Diagnoses: Patient Active Problem List   Diagnosis Date Noted  . Traumatic hematoma of left lower leg 02/17/2018  . Blister 11/30/2017  . Acute cystitis without hematuria 11/15/2017  . Dark stools 08/26/2016  . Diverticulitis of small intestine without perforation or abscess with bleeding   . Ischemic chest pain   . History of coronary artery stent placement   . Heart murmur 04/05/2016  . Umbilical hernia 73/22/0254  . Osteopenia 01/21/2016  . Fall at home 12/25/2015  . Chronic pain syndrome 02/13/2015  . Fatigue 02/13/2015  . Obesity (BMI 30-39.9) 12/01/2014  . Bipolar affective disorder ([place]) 03/25/2014  . Rheumatoid arthritis ([place]) 03/25/2014  . CAD in native artery 03/25/2014  . Acid reflux 03/25/2014  . Benign essential HTN 03/25/2014  . Adaptive colitis 03/25/2014  . HLD (hyperlipidemia) 03/25/2014    Orientation RESPIRATION BLADDER Height & Weight     Self, Time, Situation, Place  Normal Continent Weight: 141 lb (64 kg) Height:     BEHAVIORAL SYMPTOMS/MOOD NEUROLOGICAL BOWEL NUTRITION STATUS      Continent Diet(Diet: Heart Healthy )  AMBULATORY STATUS COMMUNICATION OF NEEDS Skin   Extensive Assist Verbally Normal                       Personal Care Assistance Level of Assistance   Bathing, Feeding, Dressing Bathing Assistance: Limited assistance Feeding assistance: Independent Dressing Assistance: Limited assistance     Functional Limitations Info  Sight, Hearing, Speech Sight Info: Impaired Hearing Info: Impaired Speech Info: Adequate    SPECIAL CARE FACTORS FREQUENCY  PT (By licensed PT), OT (By licensed OT)     PT Frequency: (5) OT Frequency: (5)            Contractures      Additional Factors Info  Code Status, Allergies Code Status Info: (Full Code. ) Allergies Info: (Codeine, Hydrocodone-acetaminophen, Meperidine, Niacin Er, Other, Propoxyphene, Solifenacin, Sulfa Antibiotics, Fish-derived Products, Nabumetone, Oxycodone-acetaminophen)           Current Medications (02/17/2018):  This is the current hospital active medication list Current Facility-Administered Medications  Medication Dose Route Frequency Provider Last Rate Last Dose  . acetaminophen (TYLENOL) tablet 650 mg  650 mg Oral Q6H PRN Arta Silence, MD       Or  . acetaminophen (TYLENOL) suppository 650 mg  650 mg Rectal Q6H PRN Arta Silence, MD      . aspirin EC tablet 81 mg  81 mg Oral Daily Arta Silence, MD   81 mg at 02/17/18 1042  . bisacodyl (DULCOLAX) EC tablet 5 mg  5 mg Oral BID PRN Arta Silence, MD      . buPROPion (WELLBUTRIN SR) 12 hr tablet 150 mg  150 mg Oral BID Arta Silence, MD   150 mg  at 02/17/18 1041  . calcium-vitamin D 500-200 MG-UNIT per tablet 1 tablet  1 tablet Oral Daily Arta Silence, MD   1 tablet at 02/17/18 1041  . cloNIDine (CATAPRES) tablet 0.1 mg  0.1 mg Oral BID Arta Silence, MD   0.1 mg at 02/17/18 1041  . diltiazem (CARDIZEM) tablet 30 mg  30 mg Oral TID Arta Silence, MD   30 mg at 02/17/18 1040  . ferrous sulfate tablet 325 mg  325 mg Oral BID WC Arta Silence, MD   325 mg at 02/17/18 1042  . losartan (COZAAR) tablet 50 mg  50 mg Oral Daily Arta Silence, MD   50 mg at 02/17/18  1042   And  . hydrochlorothiazide (MICROZIDE) capsule 12.5 mg  12.5 mg Oral Daily Arta Silence, MD   12.5 mg at 02/17/18 1042  . HYDROmorphone (DILAUDID) injection 0.5 mg  0.5 mg Intravenous Q3H PRN Arta Silence, MD       Or  . HYDROmorphone (DILAUDID) injection 1 mg  1 mg Intravenous Q3H PRN Arta Silence, MD      . ketorolac (TORADOL) 30 MG/ML injection 15 mg  15 mg Intravenous Q8H PRN Arta Silence, MD      . multivitamin with minerals tablet 1 tablet  1 tablet Oral Daily Arta Silence, MD   1 tablet at 02/17/18 1042  . nitroGLYCERIN (NITRODUR - Dosed in mg/24 hr) patch 0.4 mg  0.4 mg Transdermal Daily Arta Silence, MD   0.4 mg at 02/17/18 1043  . ondansetron (ZOFRAN) tablet 4 mg  4 mg Oral Q6H PRN Arta Silence, MD       Or  . ondansetron (ZOFRAN) injection 4 mg  4 mg Intravenous Q6H PRN Arta Silence, MD      . pantoprazole (PROTONIX) EC tablet 40 mg  40 mg Oral Daily Arta Silence, MD   40 mg at 02/17/18 1044  . potassium chloride SA (K-DUR,KLOR-CON) CR tablet 40 mEq  40 mEq Oral Daily Arta Silence, MD      . senna-docusate (Senokot-S) tablet 1 tablet  1 tablet Oral QHS PRN Arta Silence, MD      . sertraline (ZOLOFT) tablet 200 mg  200 mg Oral QHS Arta Silence, MD         Discharge Medications: Please see discharge summary for a list of discharge medications.  Relevant Imaging Results:  Relevant Lab Results:   Additional Information (SSN: [id number])  Angad Nabers, Veronia Beets, LCSW

## 2018-02-17 NOTE — Progress Notes (Signed)
[facility] NAME: Cindy Fitzpatrick    MR#:  [id number]  DATE OF BIRTH:  [birth year]  SUBJECTIVE:  CHIEF COMPLAINT: Patient is reporting left leg pain from the hematoma which is bothering her but ambulating fine  REVIEW OF SYSTEMS:  CONSTITUTIONAL: No fever, fatigue or weakness.  EYES: No blurred or double vision.  EARS, NOSE, AND THROAT: No tinnitus or ear pain.  RESPIRATORY: No cough, shortness of breath, wheezing or hemoptysis.  CARDIOVASCULAR: No chest pain, orthopnea, edema.  GASTROINTESTINAL: No nausea, vomiting, diarrhea or abdominal pain.  GENITOURINARY: No dysuria, hematuria.  ENDOCRINE: No polyuria, nocturia,  HEMATOLOGY: No anemia, easy bruising or bleeding SKIN: No rash or lesion. MUSCULOSKELETAL: Left calf area swelling from hematoma NEUROLOGIC: No tingling, numbness, weakness.  PSYCHIATRY: No anxiety or depression.   DRUG ALLERGIES:   Allergies  Allergen Reactions  . Codeine     Other reaction(s): Headache  . Hydrocodone-Acetaminophen Nausea And Vomiting  . Meperidine     Other reaction(s): Dizziness  . Niacin Er Other (See Comments)    Patient doesn't remember  . Other Nausea Only  . Propoxyphene     Other reaction(s): Dizziness  . Solifenacin Other (See Comments)  . Sulfa Antibiotics Other (See Comments)    Patient doesn' t remember  . Fish-Derived Products Rash    Weakness  . Nabumetone Rash  . Oxycodone-Acetaminophen Rash    Couldn't think or remember anything    VITALS:  Blood pressure (!) 141/70, pulse 89, temperature 98.3 F (36.8 C), temperature source Oral, resp. rate 16, weight 64 kg (141 lb), SpO2 96 %.  PHYSICAL EXAMINATION:  GENERAL:  82 y.o.-year-old patient lying in the bed with no acute distress.  EYES: Pupils equal, round, reactive to light and accommodation. No scleral icterus. Extraocular muscles intact.  HEENT: Head atraumatic, normocephalic. Oropharynx and nasopharynx clear.   NECK:  Supple, no jugular venous distention. No thyroid enlargement, no tenderness.  LUNGS: Normal breath sounds bilaterally, no wheezing, rales,rhonchi or crepitation. No use of accessory muscles of respiration.  CARDIOVASCULAR: S1, S2 normal. No murmurs, rubs, or gallops.  ABDOMEN: Soft, nontender, nondistended. Bowel sounds present. No organomegaly or mass.  EXTREMITIES: Left calf area is edematous with hematoma and tender to touch ,no pedal edema, cyanosis, or clubbing.  NEUROLOGIC: Cranial nerves II through XII are intact. Muscle strength 5/5 in all extremities. Sensation intact. Gait not checked.  PSYCHIATRIC: The patient is alert and oriented x 3.  SKIN: No obvious rash, lesion, or ulcer.    LABORATORY PANEL:   CBC Recent Labs  Lab 02/17/18 1423  WBC 8.2  HGB 12.2  HCT 36.5  PLT 196   ------------------------------------------------------------------------------------------------------------------  Chemistries  Recent Labs  Lab 02/17/18 0148 02/17/18 1423  NA 141 139  K 3.5 3.8  CL 107 103  CO2 27 28  GLUCOSE 129* 134*  BUN 23* 30*  CREATININE 0.84 1.15*  CALCIUM 9.1 9.5  AST 19  --   ALT 13*  --   ALKPHOS 52  --   BILITOT 0.5  --    ------------------------------------------------------------------------------------------------------------------  Cardiac Enzymes No results for input(s): TROPONINI in the last 168 hours. ------------------------------------------------------------------------------------------------------------------  RADIOLOGY:  Dg Tibia/fibula Left  Result Date: 02/16/2018 CLINICAL DATA:  Pain and swelling LEFT lower extremity. EXAM: LEFT TIBIA AND FIBULA - 2 VIEW COMPARISON:  None FINDINGS: There is no evidence of fracture or other focal bone lesions. Status post total knee arthroplasty, no periprosthetic lucency. Osteopenia. Pretibial  phleboliths. IMPRESSION: No acute osseous process. Osteopenia decreases sensitivity for acute  nondisplaced fractures. Electronically Signed   By: Elon Alas M.D.   On: 02/16/2018 22:47   US Venous Img Lower Unilateral Left  Result Date: 02/17/2018 CLINICAL DATA:  Lower leg swelling.  History of vein stripping. EXAM: LEFT LOWER EXTREMITY VENOUS DOPPLER ULTRASOUND TECHNIQUE: Eaves-scale sonography with graded compression, as well as color Doppler and duplex ultrasound were performed to evaluate the lower extremity deep venous systems from the level of the common femoral vein and including the common femoral, femoral, profunda femoral, popliteal and calf veins including the posterior tibial, peroneal and gastrocnemius veins when visible. The superficial great saphenous vein was also interrogated. Spectral Doppler was utilized to evaluate flow at rest and with distal augmentation maneuvers in the common femoral, femoral and popliteal veins. COMPARISON:  Same day radiographs. FINDINGS: Contralateral Common Femoral Vein: Respiratory phasicity is normal and symmetric with the symptomatic side. No evidence of thrombus. Normal compressibility. Common Femoral Vein: No evidence of thrombus. Normal compressibility, respiratory phasicity and response to augmentation. Saphenofemoral Junction: Not visualized possibly from vein stripping. Profunda Femoral Vein: No evidence of thrombus. Normal compressibility and flow on color Doppler imaging. Femoral Vein: No evidence of thrombus. Normal compressibility, respiratory phasicity and response to augmentation. Popliteal Vein: No evidence of thrombus. Normal compressibility, respiratory phasicity and response to augmentation. Calf Veins: Nonvisualized peroneal and posterior tibial veins. Superficial Great Saphenous Vein: No evidence of thrombus. Normal compressibility. Venous Reflux:  None. Other Findings: Hypoechoic avascular 16.7 x 4.7 x 8.5 cm subcutaneous collection compatible with a soft tissue hematoma along posteromedial calf. IMPRESSION: Subcutaneous hypoechoic  avascular abnormality along the the posteromedial calf measuring 16.7 x 4.7 x 8.5 cm compatible with an organizing soft tissue hematoma. No DVT. Electronically Signed   By: Ashley Royalty M.D.   On: 02/17/2018 00:22    EKG:   Orders placed or performed in visit on 11/29/16  . EKG 12-Lead    ASSESSMENT AND PLAN:   1.) LLE mechanical trauma with hematoma/pain: Clinically stable continue close monitoring Pain management as needed Consult placed orthopedics, for possible aspiration of the hematoma if it gets worse . LLE U/S (+) "Subcutaneous hypoechoic avascular abnormality along the the posteromedial calf measuring 16.7 x 4.7 x 8.5 cm compatible with an organizing soft tissue hematoma."  INR WNL. Hgb 11.9, CPK 58.    2.) Hyperglycemia: Probably stress-induced  hemoglobin A1c 5.6 in February 2019  3.) HTN: c/w Clonidine, Cardizem, Hyzaar, NitroDur.  4.)  Mild AKI provide IV fluids and avoid nephrotoxins-check BMP in a.m.  5.) Anx/dep/BPD: c/w Wellbutrin, Zoloft.        All the records are reviewed and case discussed with Care Management/Social Workerr. Management plans discussed with the patient, family and they are in agreement.  CODE STATUS: fc   TOTAL TIME TAKING CARE OF THIS PATIENT: 23minutes.   POSSIBLE D/C IN 1  DAYS, DEPENDING ON CLINICAL CONDITION.  Note: This dictation was prepared with Dragon dictation along with smaller phrase technology. Any transcriptional errors that result from this process are unintentional.   Nicholes Mango M.D on 02/17/2018 at 3:56 PM  Between 7am to 6pm - Pager - [phone] After 6pm go to [email or web address] - password [email or web address] [facility]  [facility]  Office  [phone]  CC: Primary care physician; [name], MD

## 2018-02-17 NOTE — Progress Notes (Signed)
Chaplain responded to and OR for prayer. Chaplain practiced listening and pastoral presence. Pt talked about how special her late husband was and how he played a role in the church. "He was a special man" . Church and God has played a very special place in her life.Change in things she is familiar with is difficult. She talked how [facility] has changed since [facility] took over.  Chaplain ended with prayer and follow up.    02/17/18 1000  Clinical Encounter Type  Visited With Health care provider  Visit Type Spiritual support  Referral From Nurse  Spiritual Encounters  Spiritual Needs Prayer;Emotional

## 2018-02-17 NOTE — Clinical Social Work Note (Signed)
Clinical Social Work Assessment  Patient Details  Name: Cindy Fitzpatrick MRN: [id number] Date of Birth: 1936/10/20  Date of referral:  02/17/18               Reason for consult:  Facility Placement                Permission sought to share information with:    Permission granted to share information::     Name::      [place]::   [facility]   Relationship::     Contact Information:     Housing/Transportation Living arrangements for the past 2 months:  [place] of Information:  Patient, Facility Patient Interpreter Needed:  None Criminal Activity/Legal Involvement Pertinent to Current Situation/Hospitalization:  No - Comment as needed Significant Relationships:  Adult Children Lives with:  Self Do you feel safe going back to the place where you live?  Yes Need for family participation in patient care:  Yes (Comment)  Care giving concerns:  Patient lives alone at [facility] independent [facility].    Social Worker assessment / plan:  Holiday representative ([place]) reviewed chart and noted that patient is from [facility]. CSW met with patient alone at bedside to discuss D/C plan. Patient was alert and oriented X4 and was laying in the bed. CSW introduced self and explained role of CSW department. Patient reported that she does not want to go to [facility] and she wants to go back to independent living. Patient reported that her HPOA is her son Cindy Fitzpatrick. [facility] admissions coordinator at [facility] is aware of above.   Employment status:  Retired Nurse, adult PT Recommendations:  Not assessed at this time Information / Referral to community resources:  [place]  Patient/Family's Response to care:  Patient prefers to go home.   Patient/Family's Understanding of and Emotional Response to Diagnosis, Current Treatment, and Prognosis:  Patient was very pleasant  and thanked CSW for visit.   Emotional Assessment Appearance:  Appears stated age Attitude/Demeanor/Rapport:    Affect (typically observed):  Accepting, Adaptable, Pleasant Orientation:  Oriented to Self, Oriented to Place, Oriented to  Time, Oriented to Situation Alcohol / Substance use:  Not Applicable Psych involvement (Current and /or in the community):  No (Comment)  Discharge Needs  Concerns to be addressed:  Discharge Planning Concerns Readmission within the last 30 days:  No Current discharge risk:  Dependent with Mobility Barriers to Discharge:  Continued Medical Work up   [employer], Veronia Beets, LCSW 02/17/2018, 2:20 PM

## 2018-02-17 NOTE — Progress Notes (Signed)
Chaplain responded to OR request for prayer and spoke with nurse Clarene Critchley. Patient would like a visit later in the morning. Referral to unit chaplain.

## 2018-02-18 LAB — BASIC METABOLIC PANEL
Anion gap: 4 — ABNORMAL LOW (ref 5–15)
BUN: 31 mg/dL — ABNORMAL HIGH (ref 6–20)
CHLORIDE: 105 mmol/L (ref 101–111)
CO2: 28 mmol/L (ref 22–32)
CREATININE: 0.97 mg/dL (ref 0.44–1.00)
Calcium: 8.7 mg/dL — ABNORMAL LOW (ref 8.9–10.3)
GFR calc Af Amer: >60 mL/min (ref >60)
GFR calc non Af Amer: 54 mL/min — ABNORMAL LOW (ref >60)
GLUCOSE: 128 mg/dL — AB (ref 65–99)
Potassium: 4 mmol/L (ref 3.5–5.1)
Sodium: 137 mmol/L (ref 135–145)

## 2018-02-18 LAB — CBC
HCT: 30 % — ABNORMAL LOW (ref 35.0–47.0)
HEMOGLOBIN: 10.3 g/dL — AB (ref 12.0–16.0)
MCH: 31 pg (ref 26.0–34.0)
MCHC: 34.4 g/dL (ref 32.0–36.0)
MCV: 89.9 fL (ref 80.0–100.0)
Platelets: 145 10*3/uL — ABNORMAL LOW (ref 150–440)
RBC: 3.34 MIL/uL — ABNORMAL LOW (ref 3.80–5.20)
RDW: 13.8 % (ref 11.5–14.5)
WBC: 7.7 10*3/uL (ref 3.6–11.0)

## 2018-02-18 MED ORDER — OXYCODONE HCL 5 MG PO TABS: 5.0000 mg | ORAL_TABLET | Freq: Three times a day (TID) | ORAL | 0 refills | Status: DC | PRN

## 2018-02-18 NOTE — Evaluation (Signed)
Physical Therapy Evaluation Patient Details Name: Cindy Fitzpatrick MRN: [id number] DOB: 03-Aug-1936 Today's Date: 02/18/2018   History of Present Illness  pt is a 82 y.o F with Dx of LLE traumatic hematoma that occured when her foot fell out of her reclier and hit the side of the recliner. known history of HTN, HLD, GERD, IBS, RA, DJD/OA, anx/dep/BPD, Hx diverticulitis, Hx shingles who p/w 1d Hx LLE trauma and pain. an [place] was performed noting no sign of a DVT and only indicating a hematome.  Clinical Impression  Pt was laying in bed with family present when starting session. She currently is A&O x 4 and currently reports 0/10 pain, but reports she hasn't moved so it may increase. Pt exhibits functional strength in the RLE with mild pain noted in the R knee. LLE strength limited to 3/5 secondary to pain response during testing. She required min assist with bed mobility and mod assist with stand and walking ~ 5 ft to recliner required verbal cues for foot placement, but exhibited significant fear of falling. She was able to perform standing weight shifting exercise to promote LLE weight acceptance and LLE strengthening once seated in the recliner. She was able to perform exercise without as much visual pain response when distracted/ talking. She would benefit from physical therapy to reduce LLE pain, improve mobility and strength, promote independent bed mobility, increase amb with RW and return pt to PLOF. Pt is a resident of [facility] planning to return upon discharge and would benefit from continued physical therapy VIA SNF and transition to [facility] as able.     Follow Up Recommendations SNF    Equipment Recommendations  None recommended by PT(pt reports having a RW)    Recommendations for Other Services       Precautions / Restrictions Precautions Precautions: Fall Restrictions Weight Bearing Restrictions: Yes Other Position/Activity Restrictions: WBAT      Mobility  Bed Mobility Overal  bed mobility: Needs Assistance Bed Mobility: Supine to Sit     Supine to sit: Min assist     General bed mobility comments: intermittent verbal cuing required for hand placement and manual assist with moving BIL LE  Transfers Overall transfer level: Needs assistance Equipment used: 1 person hand held assist Transfers: Sit to/from Stand Sit to Stand: Mod assist(with verbal cues on rocking foward and foot placment)            Ambulation/Gait Ambulation/Gait assistance: Mod assist Ambulation Distance (Feet): 5 Feet Assistive device: 1 person hand held assist Gait Pattern/deviations: Trunk flexed;Antalgic;Shuffle     General Gait Details: pt exhibited significant fear of falling. required verbal cues for foot placement to move to recliner  Stairs            Wheelchair Mobility    Modified Rankin (Stroke Patients Only)       Balance                                             Pertinent Vitals/Pain Pain Assessment: 0-10 Pain Score: 0-No pain(reported it went to 6/10 with activity) Pain Location: L posteromedial calf Pain Descriptors / Indicators: Aching;Sore Pain Intervention(s): Limited activity within patient's tolerance;Monitored during session    [place] expects to be discharged to:: Assisted living([facility]) Living Arrangements: Alone             Home Equipment: Walker - 2 wheels  Additional Comments: reported hx fo 3 falls in 6 months    Prior Function Level of Independence: Independent with assistive device(s)         Comments: per pt and family she was Independent with her use of RW     Hand Dominance   Dominant Hand: Right    Extremity/Trunk Assessment   Upper Extremity Assessment Upper Extremity Assessment: Overall WFL for tasks assessed    Lower Extremity Assessment Lower Extremity Assessment: RLE deficits/detail;LLE deficits/detail RLE Deficits / Details: 4-/5 with pain noted in the R  knee during testing  LLE Deficits / Details: 3/5 throughout LLE due to pain during assessment in all motions       Communication   Communication: No difficulties  Cognition Arousal/Alertness: Awake/alert Behavior During Therapy: WFL for tasks assessed/performed Overall Cognitive Status: Within Functional Limits for tasks assessed                                        General Comments      Exercises Other Exercises Other Exercises: standing weight shifting 3 x 5 leaing to the L holding 5 sec(sitting and resting between sets) Other Exercises: ankle pumps 2 x 10 Other Exercises: LAQ 1 x 10 LLE Only Other Exercises: seated marching 1 x 10 LLE only   Assessment/Plan    PT Assessment Patient needs continued PT services  PT Problem List Decreased strength;Decreased activity tolerance;Decreased balance;Decreased range of motion;Decreased mobility;Pain;Decreased knowledge of precautions;Decreased safety awareness       PT Treatment Interventions Gait training;Stair training;Therapeutic activities;Therapeutic exercise;Neuromuscular re-education;Patient/family education;Functional mobility training;Balance training    PT Goals (Current goals can be found in the Care Plan section)  Acute Rehab PT Goals Patient Stated Goal: to get the pain out of the leg PT Goal Formulation: With patient/family Time For Goal Achievement: 03/04/18 Potential to Achieve Goals: Good    Frequency Min 2X/week   Barriers to discharge        Co-evaluation               AM-PAC PT "6 Clicks" Daily Activity  Outcome Measure Difficulty turning over in bed (including adjusting bedclothes, sheets and blankets)?: Unable Difficulty moving from lying on back to sitting on the side of the bed? : Unable Difficulty sitting down on and standing up from a chair with arms (e.g., wheelchair, bedside commode, etc,.)?: Unable Help needed moving to and from a bed to chair (including a wheelchair)?:  A Lot Help needed walking in hospital room?: A Lot Help needed climbing 3-5 steps with a railing? : Total 6 Click Score: 8    End of Session Equipment Utilized During Treatment: Gait belt Activity Tolerance: Patient tolerated treatment well;Patient limited by fatigue;Patient limited by lethargy;Patient limited by pain Patient left: in chair;with call bell/phone within reach;with chair alarm set;with family/visitor present Nurse Communication: Mobility status(how session went) PT Visit Diagnosis: Unsteadiness on feet (R26.81);Muscle weakness (generalized) (M62.81);History of falling (Z91.81);Pain;Repeated falls (R29.6) Pain - Right/Left: Left Pain - part of body: Leg    Time: 1120-1200 PT Time Calculation (min) (ACUTE ONLY): 40 min   Charges:   PT Evaluation $PT Eval Moderate Complexity: 1 Mod PT Treatments $Therapeutic Exercise: 8-22 mins $Therapeutic Activity: 8-22 mins   PT G Codes:        Starr Lake PT, DPT, LAT, ATC  02/18/18  12:59 PM       Starr Lake 02/18/2018,  12:53 PM

## 2018-02-18 NOTE — Progress Notes (Signed)
Pts BP 109/48, MD Vianne Bulls notified, per MD hold all BP meds.

## 2018-02-18 NOTE — Progress Notes (Signed)
[place] at [place] NAME: Cindy Fitzpatrick    MR#:  [id number]  DATE OF BIRTH:  1936/09/04  SUBJECTIVE: Hematoma of the left leg because of hitting the left leg to the side of recliner.  Complains of pain but she says she cannot take any narcotics can take only Tylenol for pain control.  Very soft so we are holding her multiple antihypertensives.  Patient used bedside commode.  CHIEF COMPLAINT:   REVIEW OF SYSTEMS:  CONSTITUTIONAL: No fever, fatigue or weakness.  EYES: No blurred or double vision.  EARS, NOSE, AND THROAT: No tinnitus or ear pain.  RESPIRATORY: No cough, shortness of breath, wheezing or hemoptysis.  CARDIOVASCULAR: No chest pain, orthopnea, edema.  GASTROINTESTINAL: No nausea, vomiting, diarrhea or abdominal pain.  GENITOURINARY: No dysuria, hematuria.  ENDOCRINE: No polyuria, nocturia,  HEMATOLOGY: No anemia, easy bruising or bleeding SKIN: No rash or lesion. MUSCULOSKELETAL: ACE wrap  present of the left leg.   NEUROLOGIC: No tingling, numbness, weakness.  PSYCHIATRY: No anxiety or depression.   DRUG ALLERGIES:   Allergies  Allergen Reactions  . Codeine     Other reaction(s): Headache  . Hydrocodone-Acetaminophen Nausea And Vomiting  . Meperidine     Other reaction(s): Dizziness  . Niacin Er Other (See Comments)    Patient doesn't remember  . Other Nausea Only  . Propoxyphene     Other reaction(s): Dizziness  . Solifenacin Other (See Comments)  . Sulfa Antibiotics Other (See Comments)    Patient doesn' t remember  . Fish-Derived Products Rash    Weakness  . Nabumetone Rash  . Oxycodone-Acetaminophen Rash    Couldn't think or remember anything    VITALS:  Blood pressure (!) 109/48, pulse 72, temperature 97.8 F (36.6 C), temperature source Oral, resp. rate 18, weight 64 kg (141 lb), SpO2 100 %.  PHYSICAL EXAMINATION:  GENERAL:  82 y.o.-year-old patient lying in the bed with no acute distress.  EYES:  Pupils equal, round, reactive to light and accommodation. No scleral icterus. Extraocular muscles intact.  HEENT: Head atraumatic, normocephalic. Oropharynx and nasopharynx clear.  NECK:  Supple, no jugular venous distention. No thyroid enlargement, no tenderness.  LUNGS: Normal breath sounds bilaterally, no wheezing, rales,rhonchi or crepitation. No use of accessory muscles of respiration.  CARDIOVASCULAR: S1, S2 normal. No murmurs, rubs, or gallops.  ABDOMEN: Soft, nontender, nondistended. Bowel sounds present. No organomegaly or mass.  EXTREMITIES ACE wrap   Of the left leg. NEUROLOGIC: Cranial nerves II through XII are intact. Muscle strength 5/5 in all extremities. Sensation intact. Gait not checked.  PSYCHIATRIC: The patient is alert and oriented x 3.  SKIN: No obvious rash, lesion, or ulcer.    LABORATORY PANEL:   CBC Recent Labs  Lab 02/18/18 0328  WBC 7.7  HGB 10.3*  HCT 30.0*  PLT 145*   ------------------------------------------------------------------------------------------------------------------  Chemistries  Recent Labs  Lab 02/17/18 0148  02/18/18 0328  NA 141   < > 137  K 3.5   < > 4.0  CL 107   < > 105  CO2 27   < > 28  GLUCOSE 129*   < > 128*  BUN 23*   < > 31*  CREATININE 0.84   < > 0.97  CALCIUM 9.1   < > 8.7*  AST 19  --   --   ALT 13*  --   --   ALKPHOS 52  --   --   BILITOT 0.5  --   --    < > =  values in this interval not displayed.   ------------------------------------------------------------------------------------------------------------------  Cardiac Enzymes No results for input(s): TROPONINI in the last 168 hours. ------------------------------------------------------------------------------------------------------------------  RADIOLOGY:  Dg Tibia/fibula Left  Result Date: 02/16/2018 CLINICAL DATA:  Pain and swelling LEFT lower extremity. EXAM: LEFT TIBIA AND FIBULA - 2 VIEW COMPARISON:  None FINDINGS: There is no evidence of  fracture or other focal bone lesions. Status post total knee arthroplasty, no periprosthetic lucency. Osteopenia. Pretibial phleboliths. IMPRESSION: No acute osseous process. Osteopenia decreases sensitivity for acute nondisplaced fractures. Electronically Signed   By: Elon Alas M.D.   On: 02/16/2018 22:47   US Venous Img Lower Unilateral Left  Result Date: 02/17/2018 CLINICAL DATA:  Lower leg swelling.  History of vein stripping. EXAM: LEFT LOWER EXTREMITY VENOUS DOPPLER ULTRASOUND TECHNIQUE: Skora-scale sonography with graded compression, as well as color Doppler and duplex ultrasound were performed to evaluate the lower extremity deep venous systems from the level of the common femoral vein and including the common femoral, femoral, profunda femoral, popliteal and calf veins including the posterior tibial, peroneal and gastrocnemius veins when visible. The superficial great saphenous vein was also interrogated. Spectral Doppler was utilized to evaluate flow at rest and with distal augmentation maneuvers in the common femoral, femoral and popliteal veins. COMPARISON:  Same day radiographs. FINDINGS: Contralateral Common Femoral Vein: Respiratory phasicity is normal and symmetric with the symptomatic side. No evidence of thrombus. Normal compressibility. Common Femoral Vein: No evidence of thrombus. Normal compressibility, respiratory phasicity and response to augmentation. Saphenofemoral Junction: Not visualized possibly from vein stripping. Profunda Femoral Vein: No evidence of thrombus. Normal compressibility and flow on color Doppler imaging. Femoral Vein: No evidence of thrombus. Normal compressibility, respiratory phasicity and response to augmentation. Popliteal Vein: No evidence of thrombus. Normal compressibility, respiratory phasicity and response to augmentation. Calf Veins: Nonvisualized peroneal and posterior tibial veins. Superficial Great Saphenous Vein: No evidence of thrombus. Normal  compressibility. Venous Reflux:  None. Other Findings: Hypoechoic avascular 16.7 x 4.7 x 8.5 cm subcutaneous collection compatible with a soft tissue hematoma along posteromedial calf. IMPRESSION: Subcutaneous hypoechoic avascular abnormality along the the posteromedial calf measuring 16.7 x 4.7 x 8.5 cm compatible with an organizing soft tissue hematoma. No DVT. Electronically Signed   By: Ashley Royalty M.D.   On: 02/17/2018 00:22    EKG:   Orders placed or performed in visit on 11/29/16  . EKG 12-Lead    ASSESSMENT AND PLAN:   1.) LLE mechanical trauma with hematoma/pain: Medically stable, seen by orthopedic Dr. Earnestine Leys.  Recommended Ace wrap, ice, elevation, weightbearing as tolerated.  Follow-up with ortho as an outpatient.  Physical therapy evaluation today.  Likely discharge home tomorrow, complains of severe left leg pain.  See how she does with therapy today.   2.) Hyperglycemia: Probably stress-induced  hemoglobin A1c 5.6 in February 2019  3.) HTN: Very soft so hold antihypertensives today. 4.)  Mild AKI ; improved with IV fluids, DC IV fluids. 5.) Anx/dep/BPD: c/w Wellbutrin, Zoloft.        All the records are reviewed and case discussed with Care Management/Social Workerr. Management plans discussed with the patient, family and they are in agreement.  CODE STATUS: fc   TOTAL TIME TAKING CARE OF THIS PATIENT: 72minutes.   POSSIBLE D/C IN 1  DAYS, DEPENDING ON CLINICAL CONDITION.  Note: This dictation was prepared with Dragon dictation along with smaller phrase technology. Any transcriptional errors that result from this process are unintentional.   Epifanio Lesches M.D on 02/18/2018  at 10:24 AM  Between 7am to 6pm - Pager - [phone] After 6pm go to [email or web address] - password [email or web address] [facility]  [facility]  Office  [phone]  CC: Primary care physician; [name], MD

## 2018-02-19 MED ORDER — ALPRAZOLAM 0.25 MG PO TABS
0.2500 mg | ORAL_TABLET | Freq: Once | ORAL | Status: DC
  Filled 2018-02-19: qty 1

## 2018-02-19 NOTE — Care Management (Signed)
Tc to son, Pascale Maves. He wants to see his mother get up and walk more and be ore independent prior to leaving the hospital or he feels she will need to go to rehab. I explained to him that she is alert and oriented and that we could not make her go if she declined. He said he would talk with her further. Discussed home health PT in the home. He is agreeable. Offered a list of agencies. He prefers [facility]. Will follow up after next PT session.

## 2018-02-19 NOTE — Clinical Social Work Note (Signed)
The CSW spoke with the patient at length about discharge to SNF. The patient continues to adamantly refuse SNF, and she is not currently agreeable to home health. However, the CSW contacted her son, Leory Plowman, who is in the process of talking to her about going home with home health. The CSW advised Leory Plowman that the patient is alert and oriented; therefore, we cannot force her to discharge to a facility as she has the right to refuse services. The patient's son understands and thanked the CSW for assistance. The CSW is signing off. Please consult should the patient decide to discharge to a SNF.  Santiago Bumpers, MSW, Latanya Presser [phone]

## 2018-02-19 NOTE — Progress Notes (Signed)
[place] at [place] NAME: Cindy Fitzpatrick    MR#:  [id number]  DATE OF BIRTH:  1936/08/20  SUBJECTIVE: She has severe anxiety.  Left leg pain better.  But did not walk much with therapy yesterday.  Son concerned about her because she lives alone.  Patient son wanted her to get therapy and walk a little bit more before she goes back to her apartment..  CHIEF COMPLAINT:   REVIEW OF SYSTEMS:  CONSTITUTIONAL: No fever, fatigue or weakness.  EYES: No blurred or double vision.  EARS, NOSE, AND THROAT: No tinnitus or ear pain.  RESPIRATORY: No cough, shortness of breath, wheezing or hemoptysis.  CARDIOVASCULAR: No chest pain, orthopnea, edema.  GASTROINTESTINAL: No nausea, vomiting, diarrhea or abdominal pain.  GENITOURINARY: No dysuria, hematuria.  ENDOCRINE: No polyuria, nocturia,  HEMATOLOGY: No anemia, easy bruising or bleeding SKIN: No rash or lesion. MUSCULOSKELETAL: ACE wrap  present of the left leg.   NEUROLOGIC: No tingling, numbness, weakness.  PSYCHIATRY: Severe anxiety.   DRUG ALLERGIES:   Allergies  Allergen Reactions  . Codeine     Other reaction(s): Headache  . Hydrocodone-Acetaminophen Nausea And Vomiting  . Meperidine     Other reaction(s): Dizziness  . Niacin Er Other (See Comments)    Patient doesn't remember  . Other Nausea Only  . Propoxyphene     Other reaction(s): Dizziness  . Solifenacin Other (See Comments)  . Sulfa Antibiotics Other (See Comments)    Patient doesn' t remember  . Fish-Derived Products Rash    Weakness  . Nabumetone Rash  . Oxycodone-Acetaminophen Rash    Couldn't think or remember anything    VITALS:  Blood pressure (!) 151/71, pulse 70, temperature 98.5 F (36.9 C), temperature source Oral, resp. rate 16, weight 64 kg (141 lb), SpO2 100 %.  PHYSICAL EXAMINATION:  GENERAL:  82 y.o.-year-old patient lying in the bed with no acute distress.  Very anxious EYES: Pupils equal, round,  reactive to light and accommodation. No scleral icterus. Extraocular muscles intact.  HEENT: Head atraumatic, normocephalic. Oropharynx and nasopharynx clear.  NECK:  Supple, no jugular venous distention. No thyroid enlargement, no tenderness.  LUNGS: Normal breath sounds bilaterally, no wheezing, rales,rhonchi or crepitation. No use of accessory muscles of respiration.  CARDIOVASCULAR: S1, S2 normal. No murmurs, rubs, or gallops.  ABDOMEN: Soft, nontender, nondistended. Bowel sounds present. No organomegaly or mass.  EXTREMITIES ACE wrap   Of the left leg. NEUROLOGIC: Cranial nerves II through XII are intact. Muscle strength 5/5 in all extremities. Sensation intact. Gait not checked.  PSYCHIATRIC: The patient is alert and oriented x 3.  SKIN: No obvious rash, lesion, or ulcer.    LABORATORY PANEL:   CBC Recent Labs  Lab 02/18/18 0328  WBC 7.7  HGB 10.3*  HCT 30.0*  PLT 145*   ------------------------------------------------------------------------------------------------------------------  Chemistries  Recent Labs  Lab 02/17/18 0148  02/18/18 0328  NA 141   < > 137  K 3.5   < > 4.0  CL 107   < > 105  CO2 27   < > 28  GLUCOSE 129*   < > 128*  BUN 23*   < > 31*  CREATININE 0.84   < > 0.97  CALCIUM 9.1   < > 8.7*  AST 19  --   --   ALT 13*  --   --   ALKPHOS 52  --   --   BILITOT 0.5  --   --    < > =  values in this interval not displayed.   ------------------------------------------------------------------------------------------------------------------  Cardiac Enzymes No results for input(s): TROPONINI in the last 168 hours. ------------------------------------------------------------------------------------------------------------------  RADIOLOGY:  No results found.  EKG:   Orders placed or performed in visit on 11/29/16  . EKG 12-Lead    ASSESSMENT AND PLAN:   1.) LLE mechanical trauma with hematoma/pain: Medically stable, seen by orthopedic Dr.  Earnestine Leys.  Recommended Ace wrap, ice, elevation, weightbearing as tolerated.  Follow-up with ortho as an outpatient.  Patient seen by physical therapy that recommended SNF placement before she goes back to [place].  Spoke with patient's son, he is also concerned about patient going back to her apartment and living alone.  Will get social worker consult.  2.) Hyperglycemia: Probably stress-induced  hemoglobin A1c 5.6 in February 2019  3.) HTN: Controlled.   4.)  Mild AKI ; improved with IV fluids, DC IV fluids. 5.) Anx/dep/BPD: c/w Wellbutrin, Zoloft.,  Use Xanax small dose.  Spoke with patient's son who mentioned that patient may have used remotely long time ago and he does not remember that you she is using any.        All the records are reviewed and case discussed with Care Management/Social Workerr. Management plans discussed with the patient, family and they are in agreement.  CODE STATUS: fc   TOTAL TIME TAKING CARE OF THIS PATIENT: 35minutes.   POSSIBLE D/C IN 1  DAYS, DEPENDING ON CLINICAL CONDITION.  Note: This dictation was prepared with Dragon dictation along with smaller phrase technology. Any transcriptional errors that result from this process are unintentional.   Epifanio Lesches M.D on 02/19/2018 at 8:34 AM  Between 7am to 6pm - Pager - [phone] After 6pm go to [email or web address] - password [email or web address] [facility]  [facility]  Office  [phone]  CC: Primary care physician; [name], MD

## 2018-02-20 ENCOUNTER — Other Ambulatory Visit: Payer: Self-pay

## 2018-02-20 NOTE — Progress Notes (Signed)
Report called to [place] at [place]. Room air. Son was made aware of transport. EMS to take to [facility]. No further concers at this time.

## 2018-02-20 NOTE — Progress Notes (Signed)
Appears to be sleeping on rounds. resp easy. Call bell in reach. 

## 2018-02-20 NOTE — Progress Notes (Signed)
Medically stable for discharge

## 2018-02-20 NOTE — Progress Notes (Signed)
Patient and her son [name] are agreeable to go to [place]. Patient is medically stable for D/C to [facility] today. Per [facility] admissions coordinator at [facility] patient can come today to room 346 and [facility] SNF authorization has been received. RN will call report at [phone] and arrange EMS for transport. Clinical Education officer, museum (CSW) sent D/C orders to [facility] via [employer]. Patient is aware of above. Patient's son [name] is aware of above. Please reconsult if future social work needs arise. CSW signing off.   [employer], LCSW [phone]

## 2018-02-20 NOTE — Progress Notes (Signed)
Assisted up to bsc to void. Back to bed with call bell in reach.

## 2018-02-20 NOTE — Progress Notes (Signed)
Appears to be sleeping. No overnight events. Call bell in reach.

## 2018-02-20 NOTE — Progress Notes (Signed)
Chaplain followed up from a previous visit. Chaplain is being discharged today. Pt has som anxiety about going back to institution because of previous encounter with PT. Pt upset with the direction of home facilities. Her life centered around her family and church. When she lost her husband she seem to lost to have lost her power. She talked about her family history. Pt son came to discharge Pt. Chaplain prayed Pt.    02/20/18 1100  Clinical Encounter Type  Visited With Patient  Visit Type Follow-up;Spiritual support  Referral From [place] Needs Prayer;Emotional

## 2018-02-20 NOTE — Clinical Social Work Placement (Signed)
   CLINICAL SOCIAL WORK PLACEMENT  NOTE  Date:  02/20/2018  Patient Details  Name: Cindy Fitzpatrick MRN: [id number] Date of Birth: October 11, 1936  Clinical Social Work is seeking post-discharge placement for this patient at the [place] level of care (*CSW will initial, date and re-position this form in  chart as items are completed):  Yes   Patient/family provided with [place] Work Department's list of facilities offering this level of care within the geographic area requested by the patient (or if unable, by the patient's family).  Yes   Patient/family informed of their freedom to choose among providers that offer the needed level of care, that participate in Medicare, Medicaid or managed care program needed by the patient, have an available bed and are willing to accept the patient.  Yes   Patient/family informed of 's ownership interest in [facility] and [facility], as well as of the fact that they are under no obligation to receive care at these facilities.  PASRR submitted to EDS on 02/17/18     PASRR number received on 02/17/18     Existing PASRR number confirmed on       FL2 transmitted to all facilities in geographic area requested by pt/family on 02/20/18     FL2 transmitted to all facilities within larger geographic area on       Patient informed that his/her managed care company has contracts with or will negotiate with certain facilities, including the following:        Yes   Patient/family informed of bed offers received.  Patient chooses bed at [facility] )     Physician recommends and patient chooses bed at      Patient to be transferred to [facility] ) on 02/20/18.  Patient to be transferred to facility by [facility] )     Patient family notified on 02/20/18 of transfer.  Name of family member notified:  (Patient's son [name] is aware of D/C today. )     PHYSICIAN       Additional  Comment:    _______________________________________________ Anitra Doxtater, Veronia Beets, LCSW 02/20/2018, 1:06 PM

## 2018-02-20 NOTE — Discharge Summary (Signed)
Cindy Fitzpatrick, is a 82 y.o. female  DOB Mar 05, 1936  MRN [id number].  Admission date:  02/16/2018  Admitting Physician  Amelia Jo, MD  Discharge Date:  02/20/2018   Primary MD  [name], MD  Recommendations for primary care physician for things to follow:   Follow-up with PCP in 1 week Follow-up with Dr. Earnestine Leys in 3 to 4 weeks.   Admission Diagnosis  Intractable pain [R52] Hematoma of left lower extremity, initial encounter [S80.12XA]   Discharge Diagnosis  Intractable pain [R52] Hematoma of left lower extremity, initial encounter [S80.12XA]   Active Problems:   Traumatic hematoma of left lower leg      Past Medical History:  Diagnosis Date  . Arthritis   . Blood in stool   . Cancer ([place])    skin  . Chicken pox    SHINGLES TWICE  . Colon polyps   . Depression   . Diverticulitis   . GERD (gastroesophageal reflux disease)   . Hay fever   . Heart disease   . Heart murmur   . Hyperlipidemia   . UTI (urinary tract infection)   . Varicose veins of both lower extremities     Past Surgical History:  Procedure Laterality Date  . HERNIA REPAIR    . KNEE SURGERY         History of present illness and  Hospital Course:     Kindly see H&P for history of present illness and admission details, please review complete Labs, Consult reports and Test reports for all details in brief  HPI  from the history and physical done on the day of admission 82 year old female patient with severe anxiety, hypertension, hyperlipidemia brought in because of left leg hematoma.  Patient hit the left leg the side of recliner at home so she is admitted for left leg hematoma, pain control.  Hospital Course  Left leg hematoma: Seen by orthopedic doctor Dr. Earnestine Leys, x-ray of the left leg did not show any  fracture, DVT.  Also recommended Ace wrap, rest, elevation.  Patient cannot tolerate any narcotics.  She can take Tylenol for pain control.  Physical therapy recommended SNF placement before she goes back to her apartment.  Patient son requested her to go to SNF because she lives alone and is concerned that with her mobility .  Patient continues to refuse to go to rehab because of a previous bad experience with [facility]. We continue to recommend her to go to rehab. 2.  Hypertension: BP was soft earlier this hospitalization but now improved, continue losartan, Cardizem.  3.  Mild AKI: Improved with fluids. 4.  Anxiety, depression, bipolar: Continue Wellbutrin, Zoloft, small dose of Xanax.   Discharge Condition: Stable  Follow UP  Contact information for after-discharge care    Destination    [facility] .   Service:  Skilled Nursing Contact information: [address] [place] [phone]                Discharge Instructions  and  Discharge Medications      Allergies as of 02/20/2018      Reactions   Codeine    Other reaction(s): Headache   Hydrocodone-acetaminophen Nausea And Vomiting   Meperidine    Other reaction(s): Dizziness   Niacin Er Other (See Comments)   Patient doesn't remember   Other Nausea Only   Propoxyphene    Other reaction(s): Dizziness   Solifenacin Other (See Comments)  Sulfa Antibiotics Other (See Comments)   Patient doesn' t remember   Fish-derived Products Rash   Weakness   Nabumetone Rash   Oxycodone-acetaminophen Rash   Couldn't think or remember anything      Medication List    TAKE these medications   acetaminophen 650 MG CR tablet Commonly known as:  TYLENOL Take 650 mg by mouth every 8 (eight) hours as needed for pain.   aspirin EC 81 MG tablet Take 81 mg by mouth daily.   bisacodyl 5 MG EC tablet Commonly known as:  DULCOLAX Take 5 mg by mouth 2 (two) times daily as needed for  moderate constipation.   buPROPion 150 MG 12 hr tablet Commonly known as:  WELLBUTRIN SR TAKE 1 TABLET BY MOUTH TWICE DAILY   Calcium Carb-Cholecalciferol 600-800 MG-UNIT Tabs Take 1 tablet by mouth daily.   CENTRUM SILVER PO Take 1 tablet by mouth daily.   cloNIDine 0.1 MG tablet Commonly known as:  CATAPRES TAKE 1 TABLET BY MOUTH TWICE DAILY   diltiazem 30 MG tablet Commonly known as:  CARDIZEM TAKE 1 TABLET THREE TIMES DAILY   ferrous sulfate 325 (65 FE) MG tablet Take 1 tablet (325 mg total) by mouth 2 (two) times daily with a meal.   Fish Oil 1200 MG Caps Take 1 capsule by mouth daily.   losartan-hydrochlorothiazide 50-12.5 MG tablet Commonly known as:  HYZAAR TAKE 1 TABLET EVERY DAY   nitroGLYCERIN 0.4 mg/hr patch Commonly known as:  NITRODUR - Dosed in mg/24 hr APPLY ONE PATCH TO SKIN ONCE DAILY, LEAVE ON 12 TO 14 HOURS THEN REMOVE FOR 10 TO 12 HOURS BEFORE APPLYING NEXT PATCH   omeprazole 20 MG capsule Commonly known as:  PRILOSEC Take 20 mg by mouth 2 (two) times daily before a meal.   Polyethyl Glycol-Propyl Glycol 0.4-0.3 % Soln Apply 1 drop to eye as needed (dry eyes).   potassium chloride 10 MEQ tablet Commonly known as:  K-DUR,KLOR-CON TAKE 1 TABLET BY MOUTH DAILY   sertraline 100 MG tablet Commonly known as:  ZOLOFT TAKE 2 TABLETS BY MOUTH AT BEDTIME         Diet and Activity recommendation: See Discharge Instructions above   Consults obtained -orthopedic consult   Major procedures and Radiology Reports - PLEASE review detailed and final reports for all details, in brief -      Dg Tibia/fibula Left  Result Date: 02/16/2018 CLINICAL DATA:  Pain and swelling LEFT lower extremity. EXAM: LEFT TIBIA AND FIBULA - 2 VIEW COMPARISON:  None FINDINGS: There is no evidence of fracture or other focal bone lesions. Status post total knee arthroplasty, no periprosthetic lucency. Osteopenia. Pretibial phleboliths. IMPRESSION: No acute osseous  process. Osteopenia decreases sensitivity for acute nondisplaced fractures. Electronically Signed   By: Elon Alas M.D.   On: 02/16/2018 22:47   US Venous Img Lower Unilateral Left  Result Date: 02/17/2018 CLINICAL DATA:  Lower leg swelling.  History of vein stripping. EXAM: LEFT LOWER EXTREMITY VENOUS DOPPLER ULTRASOUND TECHNIQUE: Gerhart-scale sonography with graded compression, as well as color Doppler and duplex ultrasound were performed to evaluate the lower extremity deep venous systems from the level of the common femoral vein and including the common femoral, femoral, profunda femoral, popliteal and calf veins including the posterior tibial, peroneal and gastrocnemius veins when visible. The superficial great saphenous vein was also interrogated. Spectral Doppler was utilized to evaluate flow at rest and with distal augmentation maneuvers in the common femoral, femoral and popliteal veins. COMPARISON:  Same day radiographs. FINDINGS: Contralateral Common Femoral Vein: Respiratory phasicity is normal and symmetric with the symptomatic side. No evidence of thrombus. Normal compressibility. Common Femoral Vein: No evidence of thrombus. Normal compressibility, respiratory phasicity and response to augmentation. Saphenofemoral Junction: Not visualized possibly from vein stripping. Profunda Femoral Vein: No evidence of thrombus. Normal compressibility and flow on color Doppler imaging. Femoral Vein: No evidence of thrombus. Normal compressibility, respiratory phasicity and response to augmentation. Popliteal Vein: No evidence of thrombus. Normal compressibility, respiratory phasicity and response to augmentation. Calf Veins: Nonvisualized peroneal and posterior tibial veins. Superficial Great Saphenous Vein: No evidence of thrombus. Normal compressibility. Venous Reflux:  None. Other Findings: Hypoechoic avascular 16.7 x 4.7 x 8.5 cm subcutaneous collection compatible with a soft tissue hematoma along  posteromedial calf. IMPRESSION: Subcutaneous hypoechoic avascular abnormality along the the posteromedial calf measuring 16.7 x 4.7 x 8.5 cm compatible with an organizing soft tissue hematoma. No DVT. Electronically Signed   By: Ashley Royalty M.D.   On: 02/17/2018 00:22    Micro Results    No results found for this or any previous visit (from the past 240 hour(s)).     Today   Subjective:   Shweta Aman today stable for discharge  Objective:   Blood pressure (!) 158/62, pulse 82, temperature 98.6 F (37 C), temperature source Oral, resp. rate 18, weight 64 kg (141 lb), SpO2 99 %.   Intake/Output Summary (Last 24 hours) at 02/20/2018 0921 Last data filed at 02/19/2018 2030 Gross per 24 hour  Intake 480 ml  Output -  Net 480 ml    Exam Awake Alert, Oriented x 3, No new F.N deficits, Normal affect [place].AT,PERRAL Supple Neck,No JVD, No cervical lymphadenopathy appriciated.  Symmetrical Chest wall movement, Good air movement bilaterally, CTAB RRR,No Gallops,Rubs or new Murmurs, No Parasternal Heave +ve B.Sounds, Abd Soft, Non tender, No organomegaly appriciated, No rebound -guarding or rigidity. Ace wrap to the left leg.   Data Review   CBC w Diff:  Lab Results  Component Value Date   WBC 7.7 02/18/2018   HGB 10.3 (L) 02/18/2018   HGB 12.4 06/07/2014   HCT 30.0 (L) 02/18/2018   HCT 36.3 04/22/2014   PLT 145 (L) 02/18/2018   PLT 176 04/22/2014   LYMPHOPCT 23 02/17/2018   LYMPHOPCT 14.4 04/22/2014   MONOPCT 10 02/17/2018   MONOPCT 9.1 04/22/2014   EOSPCT 2 02/17/2018   EOSPCT 7.8 04/22/2014   BASOPCT 1 02/17/2018   BASOPCT 0.7 04/22/2014    CMP:  Lab Results  Component Value Date   NA 137 02/18/2018   NA 139 08/08/2013   K 4.0 02/18/2018   K 3.3 (L) 08/08/2013   CL 105 02/18/2018   CL 106 08/08/2013   CO2 28 02/18/2018   CO2 27 08/08/2013   BUN 31 (H) 02/18/2018   BUN 13 08/08/2013   CREATININE 0.97 02/18/2018   CREATININE 0.83 04/22/2014   PROT 5.5 (L)  02/17/2018   PROT 6.7 04/22/2014   ALBUMIN 3.5 02/17/2018   ALBUMIN 3.8 04/22/2014   BILITOT 0.5 02/17/2018   BILITOT 0.3 04/22/2014   ALKPHOS 52 02/17/2018   ALKPHOS 68 04/22/2014   AST 19 02/17/2018   AST 17 04/22/2014   ALT 13 (L) 02/17/2018   ALT 24 04/22/2014  .   Total Time in preparing paper work, data evaluation and todays exam - 3 minutes  Epifanio Lesches M.D on 02/20/2018 at 9:21 AM    Note: This dictation was prepared with Viviann Spare  dictation along with smaller phrase technology. Any transcriptional errors that result from this process are unintentional.

## 2018-02-22 ENCOUNTER — Other Ambulatory Visit: Payer: Self-pay | Admitting: Family Medicine

## 2018-02-27 ENCOUNTER — Non-Acute Institutional Stay (SKILLED_NURSING_FACILITY): Payer: Medicare Other | Admitting: Gerontology

## 2018-02-27 ENCOUNTER — Other Ambulatory Visit
Admission: RE | Admit: 2018-02-27 | Discharge: 2018-02-27 | Disposition: A | Discharge status: Home / Self Care | Payer: Medicare Other | Source: Ambulatory Visit | Attending: Gerontology | Admitting: Gerontology

## 2018-02-27 DIAGNOSIS — K429 Umbilical hernia without obstruction or gangrene: Secondary | ICD-10-CM

## 2018-02-27 DIAGNOSIS — K5909 Other constipation: Secondary | ICD-10-CM

## 2018-02-27 DIAGNOSIS — F419 Anxiety disorder, unspecified: Secondary | ICD-10-CM | POA: Diagnosis not present

## 2018-02-27 DIAGNOSIS — R413 Other amnesia: Secondary | ICD-10-CM | POA: Diagnosis present

## 2018-02-27 DIAGNOSIS — I10 Essential (primary) hypertension: Secondary | ICD-10-CM | POA: Diagnosis not present

## 2018-02-27 DIAGNOSIS — S8012XD Contusion of left lower leg, subsequent encounter: Secondary | ICD-10-CM | POA: Diagnosis not present

## 2018-02-27 DIAGNOSIS — I4891 Unspecified atrial fibrillation: Secondary | ICD-10-CM | POA: Diagnosis present

## 2018-02-27 LAB — COMPREHENSIVE METABOLIC PANEL
ALT: 22 U/L (ref 14–54)
ANION GAP: 8 (ref 5–15)
AST: 19 U/L (ref 15–41)
Albumin: 3.5 g/dL (ref 3.5–5.0)
Alkaline Phosphatase: 70 U/L (ref 38–126)
BUN: 15 mg/dL (ref 6–20)
CHLORIDE: 104 mmol/L (ref 101–111)
CO2: 26 mmol/L (ref 22–32)
Calcium: 9.5 mg/dL (ref 8.9–10.3)
Creatinine, Ser: 0.81 mg/dL (ref 0.44–1.00)
GFR calc non Af Amer: >60 mL/min (ref >60)
Glucose, Bld: 102 mg/dL — ABNORMAL HIGH (ref 65–99)
POTASSIUM: 3.6 mmol/L (ref 3.5–5.1)
SODIUM: 138 mmol/L (ref 135–145)
Total Bilirubin: 0.7 mg/dL (ref 0.3–1.2)
Total Protein: 6.2 g/dL — ABNORMAL LOW (ref 6.5–8.1)

## 2018-02-27 LAB — URINALYSIS, COMPLETE (UACMP) WITH MICROSCOPIC
Bacteria, UA: NONE SEEN
Bilirubin Urine: NEGATIVE
GLUCOSE, UA: NEGATIVE mg/dL
HGB URINE DIPSTICK: NEGATIVE
KETONES UR: NEGATIVE mg/dL
LEUKOCYTES UA: NEGATIVE
NITRITE: NEGATIVE
PROTEIN: NEGATIVE mg/dL
Specific Gravity, Urine: 1.01 (ref 1.005–1.030)
pH: 7 (ref 5.0–8.0)

## 2018-03-01 LAB — URINE CULTURE: Culture: NO GROWTH

## 2018-04-17 ENCOUNTER — Other Ambulatory Visit: Payer: Self-pay | Admitting: Family Medicine

## 2018-04-17 NOTE — Telephone Encounter (Signed)
Last OV 11/30/17 last filled Clonidine 01/05/18 60 3rf Nitroglycerin  07/29/17 30 0rf

## 2018-04-19 DIAGNOSIS — F419 Anxiety disorder, unspecified: Secondary | ICD-10-CM | POA: Insufficient documentation

## 2018-04-19 DIAGNOSIS — K5909 Other constipation: Secondary | ICD-10-CM | POA: Insufficient documentation

## 2018-04-19 NOTE — Progress Notes (Signed)
Location:      Place of Service:  SNF (31) Provider:  Toni Arthurs, NP-C  [name], MD  Patient Care Team: [name], MD as PCP - General [facility])  Extended Emergency Contact Information Primary Emergency Contact: [name] Address: [address]          [place], [place] [address] [name] of [place] Phone: [phone] Work Phone: [phone] Relation: Son Secondary Emergency Contact: [name] Address: [address]          [place], [place] [address] [name] of [place] Phone: [phone] Work Phone: [phone] Relation: Son  Code Status: DNR Goals of care: Advanced Directive information Advanced Directives 02/17/2018  Does Patient Have a Medical Advance Directive? Yes  Type of Advance Directive Living will;Healthcare Power of [place];Out of facility DNR (pink MOST or yellow form)  Does patient want to make changes to medical advance directive? No - Patient declined  Copy of [place] in Chart? -  Would patient like information on creating a medical advance directive? -     Chief Complaint  Patient presents with  . Acute Visit    near syncope/ pain    HPI:  Pt is a 82 y.o. female seen today for an acute visit for new onset feeling shakiness, lightheadedness and nausea.  Patient complaining of increased abdominal pain in the area of her hernia.  She is concerned about perforation or obstruction.  Patient appears to be highly anxious and having difficulty focusing.  Abdomen is soft, mildly tender to palpation.  Heart and lungs clear.  Patient is very concerned about differences in medication regimen compared to her home regimen.  I spent extensive amount of time going over medication list with the patient and making adjustments per her home regimen.  We discussed patient's general anxiety, as well as anxiety over her right knee.  Educated patient on abdominal findings and symptoms of concern related  to her hernia-of which she did not demonstrate.  (I.e.  S/S of perforation, obstruction, etc.) nursing reports seeing blood in the patient's brief.  Patient states she feels it was bleeding from her internal hemorrhoids.  Patient states she feels she is constipated.  Nursing notes seen patient performing self disimpaction maneuvers.  Blood seen in brief is described as light/scant bleeding.  Patient denies abdominal pain/rectal pain, bleeding from the rectum or any other negative symptoms at this time.  Vital signs stable.  No other complaints.    Past Medical History:  Diagnosis Date  . Arthritis   . Blood in stool   . Cancer ([place])    skin  . Chicken pox    SHINGLES TWICE  . Colon polyps   . Depression   . Diverticulitis   . GERD (gastroesophageal reflux disease)   . Hay fever   . Heart disease   . Heart murmur   . Hyperlipidemia   . UTI (urinary tract infection)   . Varicose veins of both lower extremities    Past Surgical History:  Procedure Laterality Date  . HERNIA REPAIR    . KNEE SURGERY      Allergies  Allergen Reactions  . Codeine     Other reaction(s): Headache  . Hydrocodone-Acetaminophen Nausea And Vomiting  . Meperidine     Other reaction(s): Dizziness  . Niacin Er Other (See Comments)    Patient doesn't remember  . Other Nausea Only  . Propoxyphene     Other reaction(s): Dizziness  .  Solifenacin Other (See Comments)  . Sulfa Antibiotics Other (See Comments)    Patient doesn' t remember  . Fish-Derived Products Rash    Weakness  . Nabumetone Rash  . Oxycodone-Acetaminophen Rash    Couldn't think or remember anything    Allergies as of 02/27/2018      Reactions   Codeine    Other reaction(s): Headache   Hydrocodone-acetaminophen Nausea And Vomiting   Meperidine    Other reaction(s): Dizziness   Niacin Er Other (See Comments)   Patient doesn't remember   Other Nausea Only   Propoxyphene    Other reaction(s): Dizziness   Solifenacin Other (See  Comments)   Sulfa Antibiotics Other (See Comments)   Patient doesn' t remember   Fish-derived Products Rash   Weakness   Nabumetone Rash   Oxycodone-acetaminophen Rash   Couldn't think or remember anything      Medication List        Accurate as of 02/27/18 11:59 PM. Always use your most recent med list.          acetaminophen 650 MG CR tablet Commonly known as:  TYLENOL Take 650 mg by mouth every 8 (eight) hours as needed for pain.   aspirin EC 81 MG tablet Take 81 mg by mouth daily.   bisacodyl 5 MG EC tablet Commonly known as:  DULCOLAX Take 5 mg by mouth 2 (two) times daily as needed for moderate constipation.   buPROPion 150 MG 12 hr tablet Commonly known as:  WELLBUTRIN SR TAKE ONE TABLET TWICE DAILY   Calcium Carb-Cholecalciferol 600-800 MG-UNIT Tabs Take 1 tablet by mouth daily.   CENTRUM SILVER PO Take 1 tablet by mouth daily.   cloNIDine 0.1 MG tablet Commonly known as:  CATAPRES TAKE 1 TABLET BY MOUTH TWICE DAILY   diltiazem 30 MG tablet Commonly known as:  CARDIZEM TAKE 1 TABLET THREE TIMES DAILY   ferrous sulfate 325 (65 FE) MG tablet Take 1 tablet (325 mg total) by mouth 2 (two) times daily with a meal.   Fish Oil 1200 MG Caps Take 1 capsule by mouth daily.   losartan-hydrochlorothiazide 50-12.5 MG tablet Commonly known as:  HYZAAR TAKE ONE TABLET BY MOUTH EVERY DAY   nitroGLYCERIN 0.4 mg/hr patch Commonly known as:  NITRODUR - Dosed in mg/24 hr APPLY ONE PATCH TO SKIN ONCE DAILY, LEAVE ON 12 TO 14 HOURS THEN REMOVE FOR 10 TO 12 HOURS BEFORE APPLYING NEXT PATCH   omeprazole 20 MG capsule Commonly known as:  PRILOSEC Take 20 mg by mouth 2 (two) times daily before a meal.   Polyethyl Glycol-Propyl Glycol 0.4-0.3 % Soln Apply 1 drop to eye as needed (dry eyes).   potassium chloride 10 MEQ tablet Commonly known as:  K-DUR,KLOR-CON TAKE 1 TABLET BY MOUTH DAILY   potassium chloride 10 MEQ tablet Commonly known as:  K-DUR TAKE 1 TABLET BY  MOUTH DAILY   sertraline 100 MG tablet Commonly known as:  ZOLOFT TAKE 2 TABLETS BY MOUTH AT BEDTIME       Review of Systems  Constitutional: Negative for activity change, appetite change, chills, diaphoresis and fever.  HENT: Negative for congestion, mouth sores, nosebleeds, postnasal drip, sneezing, sore throat, trouble swallowing and voice change.   Respiratory: Negative for apnea, cough, choking, chest tightness, shortness of breath and wheezing.   Cardiovascular: Negative for chest pain, palpitations and leg swelling.  Gastrointestinal: Positive for abdominal pain, anal bleeding and constipation. Negative for abdominal distention, diarrhea and nausea.  Genitourinary: Negative  for difficulty urinating, dysuria, frequency and urgency.  Musculoskeletal: Positive for gait problem. Negative for back pain and myalgias. Arthralgias: typical arthritis.  Skin: Positive for wound. Negative for color change, pallor and rash.  Neurological: Positive for dizziness, syncope (near syncope), weakness and light-headedness. Negative for tremors, speech difficulty, numbness and headaches.  Psychiatric/Behavioral: Negative for agitation and behavioral problems.  All other systems reviewed and are negative.   Immunization History  Administered Date(s) Administered  . Influenza, High Dose Seasonal PF 08/25/2016  . Influenza-Unspecified 07/13/2017  . Pneumococcal Conjugate-13 11/15/2017  . Pneumococcal Polysaccharide-23 03/25/2014, 07/25/2014  . Td 06/20/2015   Pertinent  Health Maintenance Due  Topic Date Due  . INFLUENZA VACCINE  05/25/2018  . DEXA SCAN  Completed   Fall Risk  01/31/2017 12/18/2015 11/27/2014  Falls in the past year? No Yes Yes  Number falls in past yr: - 2 or more 2 or more  Injury with Fall? - Yes -  Risk Factor Category  - High Fall Risk -  Risk for fall due to : - History of fall(s);Impaired balance/gait;Impaired mobility Impaired balance/gait;History of fall(s)  Follow up -  Follow up appointment -   Functional Status Survey:    Vitals:   02/24/18 1800  BP: 118/69  Pulse: 83  Resp: 18  Temp: 97.6 F (36.4 C)  SpO2: 98%   There is no height or weight on file to calculate BMI. Physical Exam  Constitutional: She is oriented to person, place, and time. Vital signs are normal. She appears well-developed and well-nourished. She is active and cooperative. She does not appear ill. No distress.  HENT:  Head: Normocephalic and atraumatic.  Mouth/Throat: Uvula is midline, oropharynx is clear and moist and mucous membranes are normal. Mucous membranes are not pale, not dry and not cyanotic.  Eyes: Pupils are equal, round, and reactive to light. Conjunctivae, EOM and lids are normal.  Neck: Trachea normal, normal range of motion and full passive range of motion without pain. Neck supple. No JVD present. No tracheal deviation, no edema and no erythema present. No thyromegaly present.  Cardiovascular: Normal rate, regular rhythm, normal heart sounds, intact distal pulses and normal pulses. Exam reveals no gallop, no distant heart sounds and no friction rub.  No murmur heard. Pulses:      Dorsalis pedis pulses are 2+ on the right side, and 2+ on the left side.  No edema  Pulmonary/Chest: Effort normal and breath sounds normal. No accessory muscle usage. No respiratory distress. She has no decreased breath sounds. She has no wheezes. She has no rhonchi. She has no rales. She exhibits no tenderness.  Abdominal: Soft. Normal appearance and bowel sounds are normal. She exhibits distension (gas). She exhibits no ascites. There is tenderness.  Musculoskeletal: Normal range of motion. She exhibits no edema or tenderness.  Expected osteoarthritis, stiffness; Bilateral Calves soft, supple. Negative Homan's Sign. B- pedal pulses equal  Neurological: She is alert and oriented to person, place, and time. She has normal strength.  Skin: Skin is warm, dry and intact. She is not  diaphoretic. No cyanosis. No pallor. Nails show no clubbing.  Psychiatric: Her speech is normal. Thought content normal. Her mood appears anxious. Her affect is labile. She is hyperactive. Cognition and memory are normal. She expresses impulsivity. She is inattentive.  Nursing note and vitals reviewed.   Labs reviewed: Recent Labs    02/17/18 1423 02/18/18 0328 02/27/18 1040  NA 139 137 138  K 3.8 4.0 3.6  CL 103  105 104  CO2 _0 GLUCOSE 134* 128* 102*  BUN 30* 31* 15  CREATININE 1.15* 0.97 0.81  CALCIUM 9.5 8.7* 9.5   Recent Labs    11/30/17 1436 02/17/18 0148 02/27/18 1040  AST _1 ALT 13 13* 22  ALKPHOS 65 52 70  BILITOT 0.4 0.5 0.7  PROT 6.6 5.5* 6.2*  ALBUMIN 4.2 3.5 3.5   Recent Labs    02/17/18 0148 02/17/18 1423 02/18/18 0328  WBC 9.8 8.2 7.7  NEUTROABS 6.3  --   --   HGB 11.9* 12.2 10.3*  HCT 34.6* 36.5 30.0*  MCV 89.5 89.8 89.9  PLT 202 196 145*   Lab Results  Component Value Date   TSH 1.44 11/27/2014   Lab Results  Component Value Date   HGBA1C 5.6 11/30/2017   Lab Results  Component Value Date   CHOL 325 (H) 11/27/2014   HDL 47.80 11/27/2014   LDLDIRECT 226.0 11/27/2014   TRIG 289.0 (H) 11/27/2014   CHOLHDL 7 11/27/2014    Significant Diagnostic Results in last 30 days:  No results found.  Assessment/Plan Onisha was seen today for acute visit.  Diagnoses and all orders for this visit:  Umbilical hernia without obstruction and without gangrene  Traumatic hematoma of left lower leg, subsequent encounter  Chronic constipation  Anxiety disorder, unspecified type  Benign essential HTN    Adjust medication regimen per home schedule  DC 3 times daily bisacodyl  Change calcium carbonate to twice daily  Change clonidine dose times from 8 AM and 8 PM to 12 PM and 8 PM  Schedule Colace 100 mg p.o. 3 times daily  Add Maalox 30 mL p.o. every 4 hours as needed reflux  DC p.m. dose of Nitropatch  DC senna  S  Add Tums 200 mg tablets 1-2 p.o. every 4 hours as needed reflux  Decrease Wellbutrin SR to 100 mg twice daily  Hold daily aspirin  Labs  Abdominal x-rays  Continue PT/OT  Continue exercises as taught by PT/OT  Continue gentle compression hematoma as needed for support  Ice as needed to hematoma for pain  Continue Tylenol CR 650 mg p.o. every 8 hours as needed pain    Family/ staff Communication:   Total Time:  Documentation:  Face to Face: 45 minutes  Family/Phone:   Labs/tests ordered: CBC, met C, complete view abdomen x-ray, UA, C&S  Medication list reviewed and assessed for continued appropriateness.  Vikki Ports, NP-C Geriatrics [facility] [phone],  [place] [address] Cell Phone (Mon-Fri 8am-5pm):  [phone] On Call:  [phone] & follow prompts after 5pm & weekends Office Phone:  [phone] Office Fax:  [phone]

## 2018-04-19 NOTE — Telephone Encounter (Signed)
Please confirm whether or not she is using the nitroglycerin patch.  This has not been refilled since last October.  Thanks.

## 2018-04-22 ENCOUNTER — Other Ambulatory Visit: Payer: Self-pay | Admitting: Family Medicine

## 2018-04-24 NOTE — Telephone Encounter (Signed)
Patients son states she is still using this

## 2018-05-22 ENCOUNTER — Inpatient Hospital Stay
Admission: EM | Admit: 2018-05-22 | Discharge: 2018-05-24 | DRG: 494 | Disposition: A | Discharge status: Skilled Nursing Facility | Payer: Medicare Other | Attending: Internal Medicine | Admitting: Internal Medicine

## 2018-05-22 ENCOUNTER — Observation Stay: Payer: Medicare Other | Admitting: Certified Registered"

## 2018-05-22 ENCOUNTER — Emergency Department: Payer: Medicare Other

## 2018-05-22 ENCOUNTER — Observation Stay: Payer: Medicare Other

## 2018-05-22 ENCOUNTER — Other Ambulatory Visit: Payer: Self-pay

## 2018-05-22 ENCOUNTER — Encounter
Admission: EM | Disposition: A | Discharge status: Skilled Nursing Facility | Payer: Self-pay | Source: Home / Self Care | Attending: Internal Medicine

## 2018-05-22 DIAGNOSIS — Z8249 Family history of ischemic heart disease and other diseases of the circulatory system: Secondary | ICD-10-CM

## 2018-05-22 DIAGNOSIS — I2510 Atherosclerotic heart disease of native coronary artery without angina pectoris: Secondary | ICD-10-CM | POA: Diagnosis present

## 2018-05-22 DIAGNOSIS — Z86010 Personal history of colonic polyps: Secondary | ICD-10-CM | POA: Diagnosis not present

## 2018-05-22 DIAGNOSIS — Z955 Presence of coronary angioplasty implant and graft: Secondary | ICD-10-CM

## 2018-05-22 DIAGNOSIS — Z882 Allergy status to sulfonamides status: Secondary | ICD-10-CM

## 2018-05-22 DIAGNOSIS — Z888 Allergy status to other drugs, medicaments and biological substances status: Secondary | ICD-10-CM

## 2018-05-22 DIAGNOSIS — F319 Bipolar disorder, unspecified: Secondary | ICD-10-CM | POA: Diagnosis present

## 2018-05-22 DIAGNOSIS — W19XXXA Unspecified fall, initial encounter: Secondary | ICD-10-CM | POA: Diagnosis present

## 2018-05-22 DIAGNOSIS — Z885 Allergy status to narcotic agent status: Secondary | ICD-10-CM

## 2018-05-22 DIAGNOSIS — I10 Essential (primary) hypertension: Secondary | ICD-10-CM | POA: Diagnosis present

## 2018-05-22 DIAGNOSIS — Z85828 Personal history of other malignant neoplasm of skin: Secondary | ICD-10-CM | POA: Diagnosis not present

## 2018-05-22 DIAGNOSIS — S82899A Other fracture of unspecified lower leg, initial encounter for closed fracture: Secondary | ICD-10-CM | POA: Diagnosis present

## 2018-05-22 DIAGNOSIS — F419 Anxiety disorder, unspecified: Secondary | ICD-10-CM | POA: Diagnosis present

## 2018-05-22 DIAGNOSIS — Z91013 Allergy to seafood: Secondary | ICD-10-CM | POA: Diagnosis not present

## 2018-05-22 DIAGNOSIS — Z66 Do not resuscitate: Secondary | ICD-10-CM | POA: Diagnosis present

## 2018-05-22 DIAGNOSIS — J301 Allergic rhinitis due to pollen: Secondary | ICD-10-CM | POA: Diagnosis present

## 2018-05-22 DIAGNOSIS — S82851A Displaced trimalleolar fracture of right lower leg, initial encounter for closed fracture: Principal | ICD-10-CM | POA: Diagnosis present

## 2018-05-22 DIAGNOSIS — Z79899 Other long term (current) drug therapy: Secondary | ICD-10-CM

## 2018-05-22 DIAGNOSIS — E785 Hyperlipidemia, unspecified: Secondary | ICD-10-CM | POA: Diagnosis present

## 2018-05-22 DIAGNOSIS — G894 Chronic pain syndrome: Secondary | ICD-10-CM | POA: Diagnosis present

## 2018-05-22 DIAGNOSIS — K219 Gastro-esophageal reflux disease without esophagitis: Secondary | ICD-10-CM | POA: Diagnosis present

## 2018-05-22 DIAGNOSIS — Z419 Encounter for procedure for purposes other than remedying health state, unspecified: Secondary | ICD-10-CM

## 2018-05-22 HISTORY — PX: ORIF ANKLE FRACTURE: SHX5408

## 2018-05-22 LAB — CBC
HEMATOCRIT: 38.2 % (ref 35.0–47.0)
Hemoglobin: 13.1 g/dL (ref 12.0–16.0)
MCH: 29.9 pg (ref 26.0–34.0)
MCHC: 34.4 g/dL (ref 32.0–36.0)
MCV: 87 fL (ref 80.0–100.0)
PLATELETS: 179 10*3/uL (ref 150–440)
RBC: 4.39 MIL/uL (ref 3.80–5.20)
RDW: 13.7 % (ref 11.5–14.5)
WBC: 9.9 10*3/uL (ref 3.6–11.0)

## 2018-05-22 LAB — COMPREHENSIVE METABOLIC PANEL
ALT: 18 U/L (ref 0–44)
AST: 35 U/L (ref 15–41)
Albumin: 4 g/dL (ref 3.5–5.0)
Alkaline Phosphatase: 58 U/L (ref 38–126)
Anion gap: 8 (ref 5–15)
BUN: 17 mg/dL (ref 8–23)
CALCIUM: 9.3 mg/dL (ref 8.9–10.3)
CHLORIDE: 104 mmol/L (ref 98–111)
CO2: 27 mmol/L (ref 22–32)
CREATININE: 0.84 mg/dL (ref 0.44–1.00)
GFR calc Af Amer: >60 mL/min (ref >60)
Glucose, Bld: 119 mg/dL — ABNORMAL HIGH (ref 70–99)
Potassium: 4 mmol/L (ref 3.5–5.1)
Sodium: 139 mmol/L (ref 135–145)
Total Bilirubin: 0.8 mg/dL (ref 0.3–1.2)
Total Protein: 6.4 g/dL — ABNORMAL LOW (ref 6.5–8.1)

## 2018-05-22 LAB — PROTIME-INR
INR: 0.95
Prothrombin Time: 12.6 seconds (ref 11.4–15.2)

## 2018-05-22 LAB — MRSA PCR SCREENING: MRSA by PCR: NEGATIVE

## 2018-05-22 LAB — TSH: TSH: 4.268 u[IU]/mL (ref 0.350–4.500)

## 2018-05-22 SURGERY — OPEN REDUCTION INTERNAL FIXATION (ORIF) ANKLE FRACTURE
Anesthesia: General | Site: Ankle | Laterality: Right | Wound class: "Clean "

## 2018-05-22 MED ORDER — PHENYLEPHRINE HCL 10 MG/ML IJ SOLN
INTRAMUSCULAR | Status: DC | PRN
  Administered 2018-05-22 (×3): 50 ug via INTRAVENOUS
  Administered 2018-05-22: 100 ug via INTRAVENOUS

## 2018-05-22 MED ORDER — FENTANYL CITRATE (PF) 100 MCG/2ML IJ SOLN
INTRAMUSCULAR | Status: AC
  Filled 2018-05-22: qty 2

## 2018-05-22 MED ORDER — METOCLOPRAMIDE HCL 5 MG/ML IJ SOLN: 5.0000 mg | Freq: Three times a day (TID) | INTRAMUSCULAR | Status: DC | PRN

## 2018-05-22 MED ORDER — DOCUSATE SODIUM 100 MG PO CAPS
100.0000 mg | ORAL_CAPSULE | Freq: Two times a day (BID) | ORAL | Status: DC
Start: 2018-05-22 — End: 2018-05-25
  Administered 2018-05-22 – 2018-05-24 (×5): 100 mg via ORAL
  Filled 2018-05-22 (×5): qty 1

## 2018-05-22 MED ORDER — NEOMYCIN-POLYMYXIN B GU 40-200000 IR SOLN
Status: AC
Start: 2018-05-22 — End: ?
  Filled 2018-05-22: qty 4

## 2018-05-22 MED ORDER — ACETAMINOPHEN 500 MG PO TABS: 1000.0000 mg | ORAL_TABLET | Freq: Every day | ORAL | Status: DC

## 2018-05-22 MED ORDER — OXYCODONE HCL 5 MG PO TABS: 2.5000 mg | ORAL_TABLET | ORAL | Status: DC | PRN

## 2018-05-22 MED ORDER — ACETAMINOPHEN 10 MG/ML IV SOLN
INTRAVENOUS | Status: AC
  Filled 2018-05-22: qty 100

## 2018-05-22 MED ORDER — METOCLOPRAMIDE HCL 10 MG PO TABS: 5.0000 mg | ORAL_TABLET | Freq: Three times a day (TID) | ORAL | Status: DC | PRN

## 2018-05-22 MED ORDER — METHOCARBAMOL 1000 MG/10ML IJ SOLN
500.0000 mg | Freq: Four times a day (QID) | INTRAVENOUS | Status: DC | PRN
  Filled 2018-05-22: qty 5

## 2018-05-22 MED ORDER — MORPHINE SULFATE (PF) 2 MG/ML IV SOLN
INTRAVENOUS | Status: AC
  Filled 2018-05-22: qty 1

## 2018-05-22 MED ORDER — SENNOSIDES-DOCUSATE SODIUM 8.6-50 MG PO TABS: 1.0000 | ORAL_TABLET | Freq: Every evening | ORAL | Status: DC | PRN

## 2018-05-22 MED ORDER — FLEET ENEMA 7-19 GM/118ML RE ENEM
1.0000 | ENEMA | Freq: Once | RECTAL | Status: AC | PRN
  Administered 2018-05-24: 1 via RECTAL

## 2018-05-22 MED ORDER — LABETALOL HCL 5 MG/ML IV SOLN
INTRAVENOUS | Status: DC | PRN
  Administered 2018-05-22: 5 mg via INTRAVENOUS

## 2018-05-22 MED ORDER — ACETAMINOPHEN 650 MG RE SUPP: 650.0000 mg | Freq: Four times a day (QID) | RECTAL | Status: DC | PRN

## 2018-05-22 MED ORDER — SUGAMMADEX SODIUM 200 MG/2ML IV SOLN
INTRAVENOUS | Status: AC
Start: 2018-05-22 — End: ?
  Filled 2018-05-22: qty 2

## 2018-05-22 MED ORDER — ESMOLOL HCL 100 MG/10ML IV SOLN
INTRAVENOUS | Status: AC
Start: 2018-05-22 — End: ?
  Filled 2018-05-22: qty 10

## 2018-05-22 MED ORDER — LACTATED RINGERS IV SOLN
INTRAVENOUS | Status: DC | PRN
  Administered 2018-05-22: 12:00:00 via INTRAVENOUS

## 2018-05-22 MED ORDER — ACETAMINOPHEN 10 MG/ML IV SOLN
INTRAVENOUS | Status: DC | PRN
  Administered 2018-05-22: 1000 mg via INTRAVENOUS

## 2018-05-22 MED ORDER — ONDANSETRON HCL 4 MG/2ML IJ SOLN: 4.0000 mg | Freq: Four times a day (QID) | INTRAMUSCULAR | Status: DC | PRN

## 2018-05-22 MED ORDER — EPHEDRINE SULFATE 50 MG/ML IJ SOLN
INTRAMUSCULAR | Status: DC | PRN
  Administered 2018-05-22 (×2): 5 mg via INTRAVENOUS

## 2018-05-22 MED ORDER — LIDOCAINE HCL (CARDIAC) PF 100 MG/5ML IV SOSY
PREFILLED_SYRINGE | INTRAVENOUS | Status: DC | PRN
  Administered 2018-05-22: 80 mg via INTRAVENOUS

## 2018-05-22 MED ORDER — PROPOFOL 10 MG/ML IV BOLUS
INTRAVENOUS | Status: DC | PRN
  Administered 2018-05-22: 20 mg via INTRAVENOUS
  Administered 2018-05-22: 100 mg via INTRAVENOUS

## 2018-05-22 MED ORDER — ACETAMINOPHEN 325 MG PO TABS: 650.0000 mg | ORAL_TABLET | Freq: Four times a day (QID) | ORAL | Status: DC | PRN

## 2018-05-22 MED ORDER — FENTANYL CITRATE (PF) 100 MCG/2ML IJ SOLN
25.0000 ug | INTRAMUSCULAR | Status: DC | PRN
Start: 2018-05-22 — End: 2018-05-22

## 2018-05-22 MED ORDER — ONDANSETRON HCL 4 MG/2ML IJ SOLN
4.0000 mg | Freq: Four times a day (QID) | INTRAMUSCULAR | Status: DC | PRN
  Administered 2018-05-22: 4 mg via INTRAVENOUS
  Filled 2018-05-22: qty 2

## 2018-05-22 MED ORDER — HYDROMORPHONE HCL 1 MG/ML IJ SOLN
0.2500 mg | INTRAMUSCULAR | Status: DC | PRN
  Administered 2018-05-22 – 2018-05-23 (×2): 0.25 mg via INTRAVENOUS
  Filled 2018-05-22 (×2): qty 1

## 2018-05-22 MED ORDER — ROCURONIUM BROMIDE 50 MG/5ML IV SOLN
INTRAVENOUS | Status: AC
  Filled 2018-05-22: qty 1

## 2018-05-22 MED ORDER — BUPROPION HCL ER (SR) 150 MG PO TB12
150.0000 mg | ORAL_TABLET | Freq: Two times a day (BID) | ORAL | Status: DC
  Administered 2018-05-22 – 2018-05-24 (×6): 150 mg via ORAL
  Filled 2018-05-22 (×6): qty 1

## 2018-05-22 MED ORDER — TRAMADOL HCL 50 MG PO TABS
50.0000 mg | ORAL_TABLET | Freq: Four times a day (QID) | ORAL | Status: DC | PRN
  Administered 2018-05-22 – 2018-05-23 (×3): 50 mg via ORAL
  Filled 2018-05-22 (×3): qty 1

## 2018-05-22 MED ORDER — BISACODYL 10 MG RE SUPP
10.0000 mg | Freq: Every day | RECTAL | Status: DC | PRN
  Administered 2018-05-24: 10 mg via RECTAL
  Filled 2018-05-22: qty 1

## 2018-05-22 MED ORDER — ONDANSETRON HCL 4 MG PO TABS
4.0000 mg | ORAL_TABLET | Freq: Four times a day (QID) | ORAL | Status: DC | PRN
  Administered 2018-05-23: 4 mg via ORAL
  Filled 2018-05-22: qty 1

## 2018-05-22 MED ORDER — ONDANSETRON HCL 4 MG/2ML IJ SOLN
INTRAMUSCULAR | Status: DC | PRN
  Administered 2018-05-22: 4 mg via INTRAVENOUS

## 2018-05-22 MED ORDER — SERTRALINE HCL 50 MG PO TABS
200.0000 mg | ORAL_TABLET | Freq: Every day | ORAL | Status: DC
  Administered 2018-05-22 – 2018-05-24 (×3): 200 mg via ORAL
  Filled 2018-05-22 (×3): qty 4

## 2018-05-22 MED ORDER — SUGAMMADEX SODIUM 200 MG/2ML IV SOLN
INTRAVENOUS | Status: DC | PRN
  Administered 2018-05-22: 140 mg via INTRAVENOUS

## 2018-05-22 MED ORDER — MORPHINE SULFATE (PF) 2 MG/ML IV SOLN
2.0000 mg | Freq: Once | INTRAVENOUS | Status: AC
  Administered 2018-05-22: 2 mg via INTRAVENOUS
  Filled 2018-05-22: qty 1

## 2018-05-22 MED ORDER — ROCURONIUM BROMIDE 100 MG/10ML IV SOLN
INTRAVENOUS | Status: DC | PRN
  Administered 2018-05-22: 10 mg via INTRAVENOUS
  Administered 2018-05-22: 30 mg via INTRAVENOUS
  Administered 2018-05-22: 10 mg via INTRAVENOUS

## 2018-05-22 MED ORDER — SODIUM CHLORIDE 0.9 % IR SOLN
Status: DC | PRN
  Administered 2018-05-22: 13:00:00

## 2018-05-22 MED ORDER — LOSARTAN POTASSIUM 50 MG PO TABS
50.0000 mg | ORAL_TABLET | Freq: Every day | ORAL | Status: DC
  Administered 2018-05-22 – 2018-05-24 (×3): 50 mg via ORAL
  Filled 2018-05-22 (×3): qty 1

## 2018-05-22 MED ORDER — ONDANSETRON HCL 4 MG PO TABS: 4.0000 mg | ORAL_TABLET | Freq: Four times a day (QID) | ORAL | Status: DC | PRN

## 2018-05-22 MED ORDER — ONDANSETRON HCL 4 MG/2ML IJ SOLN
INTRAMUSCULAR | Status: AC
  Filled 2018-05-22: qty 2

## 2018-05-22 MED ORDER — ENOXAPARIN SODIUM 40 MG/0.4ML ~~LOC~~ SOLN: 40.0000 mg | SUBCUTANEOUS | Status: DC

## 2018-05-22 MED ORDER — CEFAZOLIN SODIUM-DEXTROSE 1-4 GM/50ML-% IV SOLN
1.0000 g | Freq: Four times a day (QID) | INTRAVENOUS | Status: AC
  Administered 2018-05-22 – 2018-05-23 (×3): 1 g via INTRAVENOUS
  Filled 2018-05-22 (×3): qty 50

## 2018-05-22 MED ORDER — CEFAZOLIN SODIUM-DEXTROSE 1-4 GM/50ML-% IV SOLN
INTRAVENOUS | Status: AC
  Filled 2018-05-22: qty 50

## 2018-05-22 MED ORDER — CEFAZOLIN SODIUM-DEXTROSE 1-4 GM/50ML-% IV SOLN
INTRAVENOUS | Status: DC | PRN
  Administered 2018-05-22: 1 g via INTRAVENOUS

## 2018-05-22 MED ORDER — ESMOLOL HCL 100 MG/10ML IV SOLN
INTRAVENOUS | Status: DC | PRN
  Administered 2018-05-22 (×2): 35 mg via INTRAVENOUS

## 2018-05-22 MED ORDER — CLONIDINE HCL 0.1 MG PO TABS
0.1000 mg | ORAL_TABLET | Freq: Two times a day (BID) | ORAL | Status: DC
  Administered 2018-05-22 – 2018-05-24 (×5): 0.1 mg via ORAL
  Filled 2018-05-22 (×6): qty 1

## 2018-05-22 MED ORDER — ASPIRIN EC 325 MG PO TBEC
325.0000 mg | DELAYED_RELEASE_TABLET | Freq: Every day | ORAL | Status: DC
  Administered 2018-05-23 – 2018-05-24 (×2): 325 mg via ORAL
  Filled 2018-05-22 (×2): qty 1

## 2018-05-22 MED ORDER — LOSARTAN POTASSIUM-HCTZ 50-12.5 MG PO TABS: 1.0000 | ORAL_TABLET | Freq: Every day | ORAL | Status: DC

## 2018-05-22 MED ORDER — BUPIVACAINE-EPINEPHRINE (PF) 0.25% -1:200000 IJ SOLN
INTRAMUSCULAR | Status: AC
Start: 2018-05-22 — End: ?
  Filled 2018-05-22: qty 30

## 2018-05-22 MED ORDER — FENTANYL CITRATE (PF) 100 MCG/2ML IJ SOLN
INTRAMUSCULAR | Status: DC | PRN
  Administered 2018-05-22 (×4): 25 ug via INTRAVENOUS
  Administered 2018-05-22: 50 ug via INTRAVENOUS
  Administered 2018-05-22 (×3): 25 ug via INTRAVENOUS
  Administered 2018-05-22: 50 ug via INTRAVENOUS
  Administered 2018-05-22: 25 ug via INTRAVENOUS

## 2018-05-22 MED ORDER — OXYCODONE HCL 5 MG PO TABS: 5.0000 mg | ORAL_TABLET | ORAL | Status: DC | PRN

## 2018-05-22 MED ORDER — DILTIAZEM HCL 30 MG PO TABS
30.0000 mg | ORAL_TABLET | Freq: Three times a day (TID) | ORAL | Status: DC
  Administered 2018-05-22 – 2018-05-24 (×7): 30 mg via ORAL
  Filled 2018-05-22 (×9): qty 1

## 2018-05-22 MED ORDER — HYDROCHLOROTHIAZIDE 12.5 MG PO CAPS
12.5000 mg | ORAL_CAPSULE | Freq: Every day | ORAL | Status: DC
  Administered 2018-05-22 – 2018-05-24 (×3): 12.5 mg via ORAL
  Filled 2018-05-22 (×3): qty 1

## 2018-05-22 MED ORDER — PROMETHAZINE HCL 25 MG/ML IJ SOLN: 6.2500 mg | INTRAMUSCULAR | Status: DC | PRN

## 2018-05-22 MED ORDER — EPHEDRINE SULFATE 50 MG/ML IJ SOLN
INTRAMUSCULAR | Status: AC
  Filled 2018-05-22: qty 1

## 2018-05-22 MED ORDER — LIDOCAINE HCL (PF) 2 % IJ SOLN
INTRAMUSCULAR | Status: AC
  Filled 2018-05-22: qty 10

## 2018-05-22 MED ORDER — METHOCARBAMOL 500 MG PO TABS: 500.0000 mg | ORAL_TABLET | Freq: Four times a day (QID) | ORAL | Status: DC | PRN

## 2018-05-22 MED ORDER — SODIUM CHLORIDE 0.9 % IV SOLN
INTRAVENOUS | Status: DC
  Administered 2018-05-22 – 2018-05-23 (×2): via INTRAVENOUS

## 2018-05-22 MED ORDER — DOCUSATE SODIUM 100 MG PO CAPS
100.0000 mg | ORAL_CAPSULE | Freq: Two times a day (BID) | ORAL | Status: DC
  Administered 2018-05-22: 100 mg via ORAL
  Filled 2018-05-22: qty 1

## 2018-05-22 MED ORDER — MORPHINE SULFATE (PF) 2 MG/ML IV SOLN
2.0000 mg | Freq: Once | INTRAVENOUS | Status: AC
  Administered 2018-05-22: 2 mg via INTRAVENOUS

## 2018-05-22 MED ORDER — PROPOFOL 10 MG/ML IV BOLUS
INTRAVENOUS | Status: AC
  Filled 2018-05-22: qty 20

## 2018-05-22 MED ORDER — LABETALOL HCL 5 MG/ML IV SOLN
INTRAVENOUS | Status: AC
  Filled 2018-05-22: qty 4

## 2018-05-22 MED ORDER — BUPIVACAINE-EPINEPHRINE (PF) 0.25% -1:200000 IJ SOLN
INTRAMUSCULAR | Status: DC | PRN
  Administered 2018-05-22: 10 mL

## 2018-05-22 MED ORDER — PANTOPRAZOLE SODIUM 40 MG PO TBEC
40.0000 mg | DELAYED_RELEASE_TABLET | Freq: Every day | ORAL | Status: DC
  Administered 2018-05-23 – 2018-05-24 (×2): 40 mg via ORAL
  Filled 2018-05-22 (×2): qty 1

## 2018-05-22 MED ORDER — ACETAMINOPHEN 500 MG PO TABS
1000.0000 mg | ORAL_TABLET | Freq: Three times a day (TID) | ORAL | Status: AC
  Administered 2018-05-22 – 2018-05-23 (×4): 1000 mg via ORAL
  Filled 2018-05-22 (×4): qty 2

## 2018-05-22 SURGICAL SUPPLY — 70 items
2.7mm drill bit ×1 IMPLANT
BANDAGE ACE 4X5 VEL STRL LF (GAUZE/BANDAGES/DRESSINGS) ×2 IMPLANT
BANDAGE ACE 6X5 VEL STRL LF (GAUZE/BANDAGES/DRESSINGS) ×2 IMPLANT
BIT DRILL 122X2.7XOVR NS (BIT) IMPLANT
BIT DRILL 135X2XAO FIT SCL (BIT) IMPLANT
BIT DRILL 2.0 (BIT) ×1
BIT DRILL 2.7 (BIT) ×1
BIT DRL 122X2.7XOVR NS (BIT) ×1
BIT DRL 135X2XAO FIT SCL (BIT) ×1
BLADE SURG 15 STRL LF DISP TIS (BLADE) ×2 IMPLANT
BLADE SURG 15 STRL SS (BLADE) ×2
BLADE SURG SZ10 CARB STEEL (BLADE) ×2 IMPLANT
BNDG COHESIVE 4X5 TAN STRL (GAUZE/BANDAGES/DRESSINGS) IMPLANT
BNDG ESMARK 6X12 TAN STRL LF (GAUZE/BANDAGES/DRESSINGS) ×2 IMPLANT
CANISTER SUCT 1200ML W/VALVE (MISCELLANEOUS) ×2 IMPLANT
CAST PADDING 6X4YD ST 30248 (SOFTGOODS) ×1
CHLORAPREP W/TINT 26ML (MISCELLANEOUS) ×2 IMPLANT
CUFF TOURN 24 STER (MISCELLANEOUS) IMPLANT
CUFF TOURN 30 STER DUAL PORT (MISCELLANEOUS) ×1 IMPLANT
DRAPE C-ARM XRAY 36X54 (DRAPES) ×2 IMPLANT
DRAPE C-ARMOR (DRAPES) ×2 IMPLANT
DRAPE INCISE IOBAN 66X45 STRL (DRAPES) ×1 IMPLANT
DRAPE U-SHAPE 47X51 STRL (DRAPES) ×2 IMPLANT
DRILL 2.6X122MM WL AO SHAFT (BIT) ×1 IMPLANT
ELECT CAUTERY BLADE 6.4 (BLADE) ×2 IMPLANT
ELECT REM PT RETURN 9FT ADLT (ELECTROSURGICAL) ×2
ELECTRODE REM PT RTRN 9FT ADLT (ELECTROSURGICAL) ×1 IMPLANT
GAUZE PETRO XEROFOAM 1X8 (MISCELLANEOUS) ×3 IMPLANT
GAUZE SPONGE 4X4 12PLY STRL (GAUZE/BANDAGES/DRESSINGS) ×2 IMPLANT
GLOVE BIOGEL PI IND STRL 8 (GLOVE) ×1 IMPLANT
GLOVE BIOGEL PI IND STRL 8.5 (GLOVE) IMPLANT
GLOVE BIOGEL PI INDICATOR 8 (GLOVE) ×1
GLOVE BIOGEL PI INDICATOR 8.5 (GLOVE) ×4
GLOVE SURG SYN 7.5  E (GLOVE) ×2
GLOVE SURG SYN 7.5 E (GLOVE) ×2 IMPLANT
GLOVE SURG SYN 7.5 PF PI (GLOVE) ×2 IMPLANT
GOWN STRL REUS W/ TWL LRG LVL3 (GOWN DISPOSABLE) ×1 IMPLANT
GOWN STRL REUS W/ TWL XL LVL3 (GOWN DISPOSABLE) ×1 IMPLANT
GOWN STRL REUS W/TWL LRG LVL3 (GOWN DISPOSABLE) ×4
GOWN STRL REUS W/TWL XL LVL3 (GOWN DISPOSABLE) ×1
K-WIRE ORTHOPEDIC 1.4X150L (WIRE) ×6
KIT TURNOVER KIT A (KITS) ×2 IMPLANT
KWIRE ORTHOPEDIC 1.4X150L (WIRE) IMPLANT
NS IRRIG 1000ML POUR BTL (IV SOLUTION) ×2 IMPLANT
PACK EXTREMITY ARMC (MISCELLANEOUS) ×2 IMPLANT
PAD ABD DERMACEA PRESS 5X9 (GAUZE/BANDAGES/DRESSINGS) ×2 IMPLANT
PAD CAST CTTN 4X4 STRL (SOFTGOODS) ×1 IMPLANT
PADDING CAST COTTON 4X4 STRL (SOFTGOODS) ×2
PADDING CAST COTTON 6X4 ST (SOFTGOODS) ×1 IMPLANT
PLATE DISTAL FIBULA 3HOLE (Plate) ×1 IMPLANT
SCREW BONE 2.7X24 (Screw) ×1 IMPLANT
SCREW BONE 3.5X16MM (Screw) ×1 IMPLANT
SCREW BONE ANKLE 3.5X14MM (Screw) ×1 IMPLANT
SCREW BONE NON-LCKING 3.5X12MM (Screw) ×1 IMPLANT
SCREW LOCKING 3.5X12 (Screw) ×3 IMPLANT
SCREW LOCKING 3.5X16MM (Screw) ×3 IMPLANT
SCREW NLOCK 3.5X55 (Screw) ×1 IMPLANT
SCREW NONLOCK 70MM (Screw) ×1 IMPLANT
SPLINT FAST PLASTER 5X30 (CAST SUPPLIES) ×20
SPLINT PLASTER CAST FAST 5X30 (CAST SUPPLIES) IMPLANT
SPONGE LAP 18X18 RF (DISPOSABLE) ×3 IMPLANT
STOCKINETTE M/LG 89821 (MISCELLANEOUS) ×1 IMPLANT
STOCKINETTE STRL 6IN 960660 (GAUZE/BANDAGES/DRESSINGS) ×2 IMPLANT
SUT ETHILON 3-0 FS-10 30 BLK (SUTURE) ×6
SUT VIC AB 3-0 SH 27 (SUTURE) ×3
SUT VIC AB 3-0 SH 27X BRD (SUTURE) ×1 IMPLANT
SUT VICRYL+ 3-0 36IN CT-1 (SUTURE) ×1 IMPLANT
SUTURE EHLN 3-0 FS-10 30 BLK (SUTURE) ×1 IMPLANT
TOWEL OR 17X26 4PK STRL BLUE (TOWEL DISPOSABLE) ×3 IMPLANT
WASHER ASNIS III 4.0 SCR (Washer) ×2 IMPLANT

## 2018-05-22 NOTE — ED Provider Notes (Signed)
[facility] Emergency Department Provider Note    First MD Initiated Contact with Patient 05/22/18 0301     (approximate)  I have reviewed the triage vital signs and the nursing notes.   HISTORY  Chief Complaint Ankle Pain    HPI Cindy Fitzpatrick is a 82 y.o. female to the emergency department via EMS status post accidental fall with resultant right ankle pain and swelling.  Patient states that she was in route to the bathroom when she twisted her ankle.  Patient denies any head injury no loss of consciousness.  Patient states current pain score is mild".  Pain is worse with any movement of the ankle.   Past Medical History:  Diagnosis Date  . Arthritis   . Blood in stool   . Cancer ([place])    skin  . Chicken pox    SHINGLES TWICE  . Colon polyps   . Depression   . Diverticulitis   . GERD (gastroesophageal reflux disease)   . Hay fever   . Heart disease   . Heart murmur   . Hyperlipidemia   . UTI (urinary tract infection)   . Varicose veins of both lower extremities     Patient Active Problem List   Diagnosis Date Noted  . Ankle fracture 05/22/2018  . Chronic constipation 04/19/2018  . Anxiety disorder 04/19/2018  . Traumatic hematoma of left lower leg 02/17/2018  . Blister 11/30/2017  . Acute cystitis without hematuria 11/15/2017  . Dark stools 08/26/2016  . Diverticulitis of small intestine without perforation or abscess with bleeding   . Ischemic chest pain   . History of coronary artery stent placement   . Heart murmur 04/05/2016  . Umbilical hernia 95/06/3266  . Osteopenia 01/21/2016  . Fall at home 12/25/2015  . Chronic pain syndrome 02/13/2015  . Fatigue 02/13/2015  . Obesity (BMI 30-39.9) 12/01/2014  . Bipolar affective disorder ([place]) 03/25/2014  . Rheumatoid arthritis ([place]) 03/25/2014  . CAD in native artery 03/25/2014  . Acid reflux 03/25/2014  . Benign essential HTN 03/25/2014  . Adaptive colitis 03/25/2014  . HLD  (hyperlipidemia) 03/25/2014    Past Surgical History:  Procedure Laterality Date  . HERNIA REPAIR    . KNEE SURGERY      Prior to Admission medications   Medication Sig Start Date End Date Taking? Authorizing Provider  acetaminophen (TYLENOL) 500 MG tablet Take 1,000 mg by mouth at bedtime.   Yes [provider]  cloNIDine (CATAPRES) 0.1 MG tablet TAKE ONE TABLET BY MOUTH TWICE DAILY 04/25/18  Yes [name], MD  sertraline (ZOLOFT) 100 MG tablet TAKE 2 TABLETS BY MOUTH AT BEDTIME 02/13/18  Yes [name], MD  acetaminophen (TYLENOL) 650 MG CR tablet Take 650 mg by mouth every 8 (eight) hours as needed for pain.    [provider]  alum & mag hydroxide-simeth (MAALOX PLUS) 400-400-40 MG/5ML suspension Take 30 mLs by mouth every 4 (four) hours as needed.    [provider]  bisacodyl (DULCOLAX) 10 MG suppository Place 10 mg rectally daily as needed.    [provider]  buPROPion [facility] SR) 150 MG 12 hr tablet TAKE ONE TABLET TWICE DAILY 02/23/18   [name], MD  Calcium Carb-Cholecalciferol 600-800 MG-UNIT TABS Take 1 tablet by mouth daily.     [provider]  calcium carbonate (TUMS - DOSED IN MG ELEMENTAL CALCIUM) 500 MG chewable tablet Chew 1-2 tablets by mouth every 4 (four) hours  as needed for indigestion or heartburn. indigestion/ abd pain (1- mild; 2- moderate sx)    [provider]  diltiazem (CARDIZEM) 30 MG tablet TAKE 1 TABLET THREE TIMES DAILY 12/23/17   [name], MD  docusate sodium (COLACE) 100 MG capsule Take 100 mg by mouth 3 (three) times daily with meals.    [provider]  ferrous sulfate 325 (65 FE) MG tablet Take 325 mg by mouth at bedtime. iron supplement- dose/time changed d/t nausea    [provider]  losartan-hydrochlorothiazide (HYZAAR) 50-12.5 MG tablet TAKE 1 TABLET BY MOUTH DAILY 04/24/18   [name], MD  Multiple Vitamins-Minerals (CENTRUM SILVER PO)  Take 1 tablet by mouth daily.    [provider]  nitroGLYCERIN (NITRODUR - DOSED IN MG/24 HR) 0.4 mg/hr patch APPLY 1 PATCH DAILY LEAVE ON 12 TO 14 HOURS AND REMOVE FOR 10-12 HOURS BEFORE APPLYING NEXT PATCH 04/25/18   [name], MD  Omega-3 Fatty Acids (FISH OIL) 1200 MG CAPS Take 1 capsule by mouth daily.     [provider]  omeprazole (PRILOSEC) 20 MG capsule Take 20 mg by mouth 2 (two) times daily before a meal.    [provider]  phenylephrine (,USE FOR PREPARATION-H,) 0.25 % suppository Place 1 suppository rectally every 4 (four) hours as needed for hemorrhoids. to relieve swelling/bleeding for hemorrhoid.    [provider]  Polyethyl Glycol-Propyl Glycol 0.4-0.3 % SOLN Apply 1 drop to eye as needed (dry eyes).     [provider]  potassium chloride (K-DUR) 10 MEQ tablet TAKE 1 TABLET BY MOUTH DAILY 02/23/18   [name], MD  vitamin C (ASCORBIC ACID) 500 MG tablet Take 500 mg by mouth at bedtime.    [provider]    Allergies Codeine; Hydrocodone-acetaminophen; Meperidine; Niacin er; Other; Propoxyphene; Solifenacin; Sulfa antibiotics; Fish-derived products; Nabumetone; and Oxycodone-acetaminophen  Family History  Problem Relation Age of Onset  . Stroke Mother   . Heart attack Father     Social History Social History   Tobacco Use  . Smoking status: Never Smoker  . Smokeless tobacco: Never Used  Substance Use Topics  . Alcohol use: No    Alcohol/week: 0.0 oz  . Drug use: No    Review of Systems Constitutional: No fever/chills Eyes: No visual changes. ENT: No sore throat. Cardiovascular: Denies chest pain. Respiratory: Denies shortness of breath. Gastrointestinal: No abdominal pain.  No nausea, no vomiting.  No diarrhea.  No constipation. Genitourinary: Negative for dysuria. Musculoskeletal: Negative for neck pain.  Negative for back pain.  Positive for right ankle pain Integumentary: Negative for  rash. Neurological: Negative for headaches, focal weakness or numbness.  ____________________________________________   PHYSICAL EXAM:  VITAL SIGNS: ED Triage Vitals [05/22/18 0032]  Enc Vitals Group     BP (!) 132/102     Pulse Rate 82     Resp 18     Temp 98.3 F (36.8 C)     Temp Source Oral     SpO2 100 %     Weight 59 kg (130 lb)     Height 1.6 m (5\' 3" )     Head Circumference      Peak Flow      Pain Score 0     Pain Loc      Pain Edu?      Excl. in [place]?     Constitutional: Alert and oriented. Well appearing and in no acute distress. Eyes: Conjunctivae  are normal. PERRL. EOMI. Head: Atraumatic. Mouth/Throat: Mucous membranes are moist.  Oropharynx non-erythematous. Neck: No stridor.   Cardiovascular: Normal rate, regular rhythm. Good peripheral circulation. Grossly normal heart sounds. Respiratory: Normal respiratory effort.  No retractions. Lungs CTAB. Gastrointestinal: Soft and nontender. No distention.  Musculoskeletal: Gross deformity of the right ankle  noted with overlying ecchymoses.  No tenting of the skin or open wound. Neurologic:  Normal speech and language. No gross focal neurologic deficits are appreciated.  Skin:  Skin is warm, dry and intact. No rash noted. Psychiatric: Mood and affect are normal. Speech and behavior are normal.  ____________________________________________   LABS (all labs ordered are listed, but only abnormal results are displayed)  Labs Reviewed  CBC  COMPREHENSIVE METABOLIC PANEL  PROTIME-INR      RADIOLOGY I, [place], personally viewed and evaluated these images (plain radiographs) as part of my medical decision making, as well as reviewing the written report by the radiologist.  ED MD interpretation: Fracture with displacement of the medial and lateral malleoli.  Talus fracture as well  Official radiology report(s): Dg Ankle 2 Views Right  Result Date: 05/22/2018 CLINICAL DATA:  Fracture, postreduction.  EXAM: RIGHT ANKLE - 2 VIEW COMPARISON:  Pre reduction radiographs earlier this day. FINDINGS: Overlying splint material in place limiting osseous and soft tissue fine detail. Improved alignment of distal tibia and fibular fractures postreduction with improved alignment of the tibial talar articulation. Slight residual lateral displacement of talus and distal fracture fragments. No new fracture. IMPRESSION: Improved alignment of medial and lateral malleolar fractures postreduction. Improved tibial talar alignment with mild residual lateral subluxation. Electronically Signed   By: Jeb Levering M.D.   On: 05/22/2018 02:45   Dg Ankle Complete Right  Result Date: 05/22/2018 CLINICAL DATA:  Post fall with right ankle pain, bruising, swelling and deformity. EXAM: RIGHT ANKLE - COMPLETE 3+ VIEW COMPARISON:  None. FINDINGS: Displaced ankle fracture subluxation. The talus is displaced laterally and posteriorly with respect to the tibial plafond, the distal tibia is perched on the talar dome. Transverse displaced fracture of the medial malleolus, distal fracture fragment remains aligned with the talus. Oblique displaced fracture of the distal fibula at the level of the ankle mortise, distal fracture fragment remains aligned with the talus. Diffuse soft tissue edema. IMPRESSION: Ankle fracture subluxation with displaced fractures of the medial and lateral malleoli. Talus and distal fracture fragment displaced laterally and posteriorly with respect to the tibial plafond. Electronically Signed   By: Jeb Levering M.D.   On: 05/22/2018 01:33     .Ortho Injury Treatment Date/Time: 05/22/2018 3:42 AM Performed by: Gregor Hams, MD Authorized by: Gregor Hams, MD   Consent:    Consent obtained:  Verbal   Consent given by:  Patient   Risks discussed:  Irreducible dislocation   Alternatives discussed:  ImmobilizationInjury location: ankle Location details: right ankle Injury type:  fracture-dislocation Fracture type: trimalleolar Pre-procedure distal perfusion: normal Pre-procedure neurological function: normal Pre-procedure range of motion: reduced  Anesthesia: Local anesthesia used: no  Patient sedated: NoManipulation performed: yes Skeletal traction used: yes Reduction successful: yes Immobilization: splint Supplies used: cotton padding and Ortho-Glass Post-procedure neurovascular assessment: post-procedure neurovascularly intact Post-procedure distal perfusion: normal Post-procedure neurological function: normal Post-procedure range of motion: unchanged Patient tolerance: Patient tolerated the procedure well with no immediate complications      ____________________________________________   INITIAL IMPRESSION / ASSESSMENT AND PLAN / ED COURSE  As part of my medical decision making, I reviewed the  following data within the [place] NUMBER   82 year old female presented with above-stated history and physical exam consistent with right ankle fracture.  X-ray revealed displacement and as such manual reduction was performed splint was applied.  Patient discussed with Dr. Donivan Scull orthopedic surgeon on-call who recommended hospitalist admission.  Dr. Marry Guan will see the patient today per Dr. Donivan Scull.  Patient was given IV morphine before manual reduction and tolerated the procedure well. ____________________________________________  FINAL CLINICAL IMPRESSION(S) / ED DIAGNOSES  Final diagnoses:  Trimalleolar fracture, right, closed, initial encounter     MEDICATIONS GIVEN DURING THIS VISIT:  Medications  morphine 2 MG/ML injection 2 mg (2 mg Intravenous Given 05/22/18 0126)  morphine 2 MG/ML injection 2 mg (2 mg Intravenous Given 05/22/18 0222)     ED Discharge Orders    None       Note:  This document was prepared using Dragon voice recognition software and may include unintentional dictation errors.    Gregor Hams,  MD 05/22/18 [id number]

## 2018-05-22 NOTE — ED Triage Notes (Signed)
Pt states that as she was walking tonight she twisted her right ankle, pt has deformity, swelling, and bruising to the right ankle

## 2018-05-22 NOTE — Consult Note (Signed)
ORTHOPAEDIC CONSULTATION  REQUESTING PHYSICIAN: Vaughan Basta, *  Chief Complaint:   R ankle pain  History of Present Illness: Cindy Fitzpatrick is a 82 y.o. female who had a fall at home late last night.  The patient noted immediate ankle pain and inability to ambulate.  The patient ambulates with a walker at baseline. She lives independently. She has family in the area.  Pain is described as sharp at its worst and a dull ache at its best.  Pain is rated a 7 out of 10 in severity curerntly.  Pain is improved with rest and immobilization.  Pain is worse with any sort of movement.  X-rays in the emergency department showed a bimalleolar ankle fracture with disruption of the mortise. She underwent a closed reduction and splinting in the ED prior to admission.  Past Medical History:  Diagnosis Date  . Arthritis   . Blood in stool   . Cancer ([place])    skin  . Chicken pox    SHINGLES TWICE  . Colon polyps   . Depression   . Diverticulitis   . GERD (gastroesophageal reflux disease)   . Hay fever   . Heart disease   . Heart murmur   . Hyperlipidemia   . UTI (urinary tract infection)   . Varicose veins of both lower extremities    Past Surgical History:  Procedure Laterality Date  . HERNIA REPAIR    . KNEE SURGERY     Social History   Socioeconomic History  . Marital status: Widowed    Spouse name: Not on file  . Number of children: Not on file  . Years of education: Not on file  . Highest education level: Not on file  Occupational History  . Not on file  Social Needs  . Financial resource strain: Not on file  . Food insecurity:    Worry: Not on file    Inability: Not on file  . Transportation needs:    Medical: Not on file    Non-medical: Not on file  Tobacco Use  . Smoking status: Never Smoker  . Smokeless tobacco: Never Used  Substance and Sexual Activity  . Alcohol use: No    Alcohol/week: 0.0 oz   . Drug use: No  . Sexual activity: Never  Lifestyle  . Physical activity:    Days per week: Not on file    Minutes per session: Not on file  . Stress: Not on file  Relationships  . Social connections:    Talks on phone: Not on file    Gets together: Not on file    Attends religious service: Not on file    Active member of club or organization: Not on file    Attends meetings of clubs or organizations: Not on file    Relationship status: Not on file  Other Topics Concern  . Not on file  Social History Narrative  . Not on file   Family History  Problem Relation Age of Onset  . Stroke Mother   . Heart attack Father    Allergies  Allergen Reactions  . Codeine Other (See Comments)    Other reaction(s): Headache  . Hydrocodone-Acetaminophen Nausea And Vomiting  . Meperidine     Other reaction(s): Dizziness  . Niacin Er Other (See Comments)    Patient doesn't remember  . Other Nausea Only  . Propoxyphene Other (See Comments)    Other reaction(s): Dizziness  . Solifenacin Other (See Comments)  . Sulfa Antibiotics  Other (See Comments)    Patient doesn' t remember  . Fish-Derived Products Rash    Weakness  . Nabumetone Rash  . Oxycodone-Acetaminophen Rash    Couldn't think or remember anything   Prior to Admission medications   Medication Sig Start Date End Date Taking? Authorizing Provider  acetaminophen (TYLENOL) 500 MG tablet Take 1,000 mg by mouth at bedtime.   Yes [provider]  cloNIDine (CATAPRES) 0.1 MG tablet TAKE ONE TABLET BY MOUTH TWICE DAILY 04/25/18  Yes [name], MD  sertraline (ZOLOFT) 100 MG tablet TAKE 2 TABLETS BY MOUTH AT BEDTIME 02/13/18  Yes [name], MD  acetaminophen (TYLENOL) 650 MG CR tablet Take 650 mg by mouth every 8 (eight) hours as needed for pain.    [provider]  alum & mag hydroxide-simeth (MAALOX PLUS) 400-400-40 MG/5ML suspension Take 30 mLs by mouth every 4 (four) hours as needed.    [provider]  bisacodyl (DULCOLAX) 10 MG suppository Place 10 mg rectally daily as needed.    [provider]  buPROPion [facility] SR) 150 MG 12 hr tablet TAKE ONE TABLET TWICE DAILY 02/23/18   [name], MD  Calcium Carb-Cholecalciferol 600-800 MG-UNIT TABS Take 1 tablet by mouth daily.     [provider]  calcium carbonate (TUMS - DOSED IN MG ELEMENTAL CALCIUM) 500 MG chewable tablet Chew 1-2 tablets by mouth every 4 (four) hours as needed for indigestion or heartburn. indigestion/ abd pain (1- mild; 2- moderate sx)    [provider]  diltiazem (CARDIZEM) 30 MG tablet TAKE 1 TABLET THREE TIMES DAILY 12/23/17   [name], MD  docusate sodium (COLACE) 100 MG capsule Take 100 mg by mouth 3 (three) times daily with meals.    [provider]  ferrous sulfate 325 (65 FE) MG tablet Take 325 mg by mouth at bedtime. iron supplement- dose/time changed d/t nausea    [provider]  losartan-hydrochlorothiazide (HYZAAR) 50-12.5 MG tablet TAKE 1 TABLET BY MOUTH DAILY 04/24/18   [name], MD  Multiple Vitamins-Minerals (CENTRUM SILVER PO) Take 1 tablet by mouth daily.    [provider]  nitroGLYCERIN (NITRODUR - DOSED IN MG/24 HR) 0.4 mg/hr patch APPLY 1 PATCH DAILY LEAVE ON 12 TO 14 HOURS AND REMOVE FOR 10-12 HOURS BEFORE APPLYING NEXT PATCH 04/25/18   [name], MD  Omega-3 Fatty Acids (FISH OIL) 1200 MG CAPS Take 1 capsule by mouth daily.     [provider]  omeprazole (PRILOSEC) 20 MG capsule Take 20 mg by mouth 2 (two) times daily before a meal.    [provider]  phenylephrine (,USE FOR PREPARATION-H,) 0.25 % suppository Place 1 suppository rectally every 4 (four) hours as needed for hemorrhoids. to relieve swelling/bleeding for hemorrhoid.    [provider]  Polyethyl Glycol-Propyl Glycol 0.4-0.3 % SOLN Apply 1 drop to eye as needed (dry eyes).     [provider]  potassium  chloride (K-DUR) 10 MEQ tablet TAKE 1 TABLET BY MOUTH DAILY 02/23/18   [name], MD  vitamin C (ASCORBIC ACID) 500 MG tablet Take 500 mg by mouth at bedtime.    [provider]   Recent Labs    05/22/18 0355  WBC 9.9  HGB 13.1  HCT 38.2  PLT 179  K 4.0  CL 104  CO2 27  BUN 17  CREATININE 0.84  GLUCOSE 119*  CALCIUM 9.3  INR 0.95  Dg Ankle 2 Views Right  Result Date: 05/22/2018 CLINICAL DATA:  Fracture, postreduction. EXAM: RIGHT ANKLE - 2 VIEW COMPARISON:  Pre reduction radiographs earlier this day. FINDINGS: Overlying splint material in place limiting osseous and soft tissue fine detail. Improved alignment of distal tibia and fibular fractures postreduction with improved alignment of the tibial talar articulation. Slight residual lateral displacement of talus and distal fracture fragments. No new fracture. IMPRESSION: Improved alignment of medial and lateral malleolar fractures postreduction. Improved tibial talar alignment with mild residual lateral subluxation. Electronically Signed   By: Jeb Levering M.D.   On: 05/22/2018 02:45   Dg Ankle Complete Right  Result Date: 05/22/2018 CLINICAL DATA:  Post fall with right ankle pain, bruising, swelling and deformity. EXAM: RIGHT ANKLE - COMPLETE 3+ VIEW COMPARISON:  None. FINDINGS: Displaced ankle fracture subluxation. The talus is displaced laterally and posteriorly with respect to the tibial plafond, the distal tibia is perched on the talar dome. Transverse displaced fracture of the medial malleolus, distal fracture fragment remains aligned with the talus. Oblique displaced fracture of the distal fibula at the level of the ankle mortise, distal fracture fragment remains aligned with the talus. Diffuse soft tissue edema. IMPRESSION: Ankle fracture subluxation with displaced fractures of the medial and lateral malleoli. Talus and distal fracture fragment displaced laterally and posteriorly with respect to the tibial  plafond. Electronically Signed   By: Jeb Levering M.D.   On: 05/22/2018 01:33     Positive ROS: All other systems have been reviewed and were otherwise negative with the exception of those mentioned in the HPI and as above.  Physical Exam: BP (!) 165/56   Pulse 84   Temp 98.4 F (36.9 C) (Oral)   Resp 18   Ht 5\' 3"  (1.6 m)   Wt 70.5 kg (155 lb 6.8 oz)   SpO2 100%   BMI 27.53 kg/m  General:  Alert, no acute distress Psychiatric:  Patient is competent for consent with normal mood and affect   Cardiovascular:  No pedal edema, regular rate and rhythm Respiratory:  No wheezing, non-labored breathing GI:  Abdomen is soft and non-tender Skin:  No lesions in the area of chief complaint, no erythema Neurologic:  Sensation intact distally, CN grossly intact Lymphatic:  No axillary or cervical lymphadenopathy  Orthopedic Exam:  RLE: + wiggles toes SILT over toes Toes wwp Splint in place  .X-rays:  As above: bimalleolar ankle fracture with possible trimalleolar component.  CT Scan: pending  Assessment/Plan: Cindy Fitzpatrick is a 82 y.o. female with a R ankle fracture   1. I discussed the various treatment options including both surgical and non-surgical management of her fracture with the patient. We discussed the high risk of perioperative complications due to patient's age and co-morbidities. After discussion of risks, benefits, and alternatives to surgery, the patient was in agreement to proceed with surgery. Plan for surgery is ORIF R ankle later today ~12pm. 2. NPO until OR 3. Hold anticoagulation until after OR.       Leim Fabry   05/22/2018 7:45 AM

## 2018-05-22 NOTE — H&P (Addendum)
[facility] is an 82 y.o. female.   Chief Complaint: Fall HPI: The patient with past medical history of GERD, arthritis and treated skin cancer presents to the emergency department after a fall.  The patient reports rolling her ankle.  She had immediate swelling and bruising of her right ankle which was found to be broken on x-ray.  The joint was reduced by the emergency department staff prior to the hospitalist service given called for admission.  Past Medical History:  Diagnosis Date  . Arthritis   . Blood in stool   . Cancer ([place])    skin  . Chicken pox    SHINGLES TWICE  . Colon polyps   . Depression   . Diverticulitis   . GERD (gastroesophageal reflux disease)   . Hay fever   . Heart disease   . Heart murmur   . Hyperlipidemia   . UTI (urinary tract infection)   . Varicose veins of both lower extremities     Past Surgical History:  Procedure Laterality Date  . HERNIA REPAIR    . KNEE SURGERY      Family History  Problem Relation Age of Onset  . Stroke Mother   . Heart attack Father    Social History:  reports that she has never smoked. She has never used smokeless tobacco. She reports that she does not drink alcohol or use drugs.  Allergies:  Allergies  Allergen Reactions  . Codeine Other (See Comments)    Other reaction(s): Headache  . Hydrocodone-Acetaminophen Nausea And Vomiting  . Meperidine     Other reaction(s): Dizziness  . Niacin Er Other (See Comments)    Patient doesn't remember  . Other Nausea Only  . Propoxyphene Other (See Comments)    Other reaction(s): Dizziness  . Solifenacin Other (See Comments)  . Sulfa Antibiotics Other (See Comments)    Patient doesn' t remember  . Fish-Derived Products Rash    Weakness  . Nabumetone Rash  . Oxycodone-Acetaminophen Rash    Couldn't think or remember anything    Medications Prior to Admission  Medication Sig Dispense Refill  . acetaminophen (TYLENOL) 500 MG tablet Take 1,000 mg by mouth at  bedtime.    . cloNIDine (CATAPRES) 0.1 MG tablet TAKE ONE TABLET BY MOUTH TWICE DAILY 60 tablet 3  . sertraline (ZOLOFT) 100 MG tablet TAKE 2 TABLETS BY MOUTH AT BEDTIME 60 tablet 3  . acetaminophen (TYLENOL) 650 MG CR tablet Take 650 mg by mouth every 8 (eight) hours as needed for pain.    Marland Kitchen alum & mag hydroxide-simeth (MAALOX PLUS) 400-400-40 MG/5ML suspension Take 30 mLs by mouth every 4 (four) hours as needed.    . bisacodyl (DULCOLAX) 10 MG suppository Place 10 mg rectally daily as needed.    Marland Kitchen buPROPion (WELLBUTRIN SR) 150 MG 12 hr tablet TAKE ONE TABLET TWICE DAILY 60 tablet 2  . Calcium Carb-Cholecalciferol 600-800 MG-UNIT TABS Take 1 tablet by mouth daily.     . calcium carbonate (TUMS - DOSED IN MG ELEMENTAL CALCIUM) 500 MG chewable tablet Chew 1-2 tablets by mouth every 4 (four) hours as needed for indigestion or heartburn. indigestion/ abd pain (1- mild; 2- moderate sx)    . diltiazem (CARDIZEM) 30 MG tablet TAKE 1 TABLET THREE TIMES DAILY 270 tablet 1  . docusate sodium (COLACE) 100 MG capsule Take 100 mg by mouth 3 (three) times daily with meals.    . ferrous sulfate 325 (65 FE) MG tablet Take 325 mg  by mouth at bedtime. iron supplement- dose/time changed d/t nausea    . losartan-hydrochlorothiazide (HYZAAR) 50-12.5 MG tablet TAKE 1 TABLET BY MOUTH DAILY 30 tablet 2  . Multiple Vitamins-Minerals (CENTRUM SILVER PO) Take 1 tablet by mouth daily.    . nitroGLYCERIN (NITRODUR - DOSED IN MG/24 HR) 0.4 mg/hr patch APPLY 1 PATCH DAILY LEAVE ON 12 TO 14 HOURS AND REMOVE FOR 10-12 HOURS BEFORE APPLYING NEXT PATCH 30 patch 2  . Omega-3 Fatty Acids (FISH OIL) 1200 MG CAPS Take 1 capsule by mouth daily.     Marland Kitchen omeprazole (PRILOSEC) 20 MG capsule Take 20 mg by mouth 2 (two) times daily before a meal.    . phenylephrine (,USE FOR PREPARATION-H,) 0.25 % suppository Place 1 suppository rectally every 4 (four) hours as needed for hemorrhoids. to relieve swelling/bleeding for hemorrhoid.    Vladimir Faster  Glycol-Propyl Glycol 0.4-0.3 % SOLN Apply 1 drop to eye as needed (dry eyes).     . potassium chloride (K-DUR) 10 MEQ tablet TAKE 1 TABLET BY MOUTH DAILY 90 tablet 1  . vitamin C (ASCORBIC ACID) 500 MG tablet Take 500 mg by mouth at bedtime.      Results for orders placed or performed during the hospital encounter of 05/22/18 (from the past 48 hour(s))  CBC     Status: None   Collection Time: 05/22/18  3:55 AM  Result Value Ref Range   WBC 9.9 3.6 - 11.0 K/uL   RBC 4.39 3.80 - 5.20 MIL/uL   Hemoglobin 13.1 12.0 - 16.0 g/dL   HCT 38.2 35.0 - 47.0 %   MCV 87.0 80.0 - 100.0 fL   MCH 29.9 26.0 - 34.0 pg   MCHC 34.4 32.0 - 36.0 g/dL   RDW 13.7 11.5 - 14.5 %   Platelets 179 150 - 440 K/uL    Comment: Performed at [facility], [address] [place], [place] [address]  Comprehensive metabolic panel     Status: Abnormal   Collection Time: 05/22/18  3:55 AM  Result Value Ref Range   Sodium 139 135 - 145 mmol/L   Potassium 4.0 3.5 - 5.1 mmol/L    Comment: HEMOLYSIS AT THIS LEVEL MAY AFFECT RESULT   Chloride 104 98 - 111 mmol/L   CO2 27 22 - 32 mmol/L   Glucose, Bld 119 (H) 70 - 99 mg/dL   BUN 17 8 - 23 mg/dL   Creatinine, Ser 0.84 0.44 - 1.00 mg/dL   Calcium 9.3 8.9 - 10.3 mg/dL   Total Protein 6.4 (L) 6.5 - 8.1 g/dL   Albumin 4.0 3.5 - 5.0 g/dL   AST 35 15 - 41 U/L   ALT 18 0 - 44 U/L   Alkaline Phosphatase 58 38 - 126 U/L   Total Bilirubin 0.8 0.3 - 1.2 mg/dL   GFR calc non Af Amer >60 >60 mL/min   GFR calc Af Amer >60 >60 mL/min    Comment: (NOTE) The eGFR has been calculated using the CKD EPI equation. This calculation has not been validated in all clinical situations. eGFR's persistently <60 mL/min signify possible Chronic Kidney Disease.    Anion gap 8 5 - 15    Comment: Performed at [facility], [address] [place], [place] [address]  Protime-INR     Status: None   Collection Time: 05/22/18  3:55 AM  Result Value Ref Range   Prothrombin Time  12.6 11.4 - 15.2 seconds   INR 0.95  Comment: Performed at [facility], [address] [place], [place] [address]  TSH     Status: None   Collection Time: 05/22/18  3:55 AM  Result Value Ref Range   TSH 4.268 0.350 - 4.500 uIU/mL    Comment: Performed by a 3rd Generation assay with a functional sensitivity of <=0.01 uIU/mL. Performed at [facility], [address] [place], [place] [address]   MRSA PCR Screening     Status: None   Collection Time: 05/22/18  4:41 AM  Result Value Ref Range   MRSA by PCR NEGATIVE NEGATIVE    Comment:        The GeneXpert MRSA Assay (FDA approved for NASAL specimens only), is one component of a comprehensive MRSA colonization surveillance program. It is not intended to diagnose MRSA infection nor to guide or monitor treatment for MRSA infections. Performed at [facility], [address], [place], [place] [address]    Dg Ankle 2 Views Right  Result Date: 05/22/2018 CLINICAL DATA:  Fracture, postreduction. EXAM: RIGHT ANKLE - 2 VIEW COMPARISON:  Pre reduction radiographs earlier this day. FINDINGS: Overlying splint material in place limiting osseous and soft tissue fine detail. Improved alignment of distal tibia and fibular fractures postreduction with improved alignment of the tibial talar articulation. Slight residual lateral displacement of talus and distal fracture fragments. No new fracture. IMPRESSION: Improved alignment of medial and lateral malleolar fractures postreduction. Improved tibial talar alignment with mild residual lateral subluxation. Electronically Signed   By: Jeb Levering M.D.   On: 05/22/2018 02:45   Dg Ankle Complete Right  Result Date: 05/22/2018 CLINICAL DATA:  Post fall with right ankle pain, bruising, swelling and deformity. EXAM: RIGHT ANKLE - COMPLETE 3+ VIEW COMPARISON:  None. FINDINGS: Displaced ankle fracture subluxation. The talus is displaced laterally and posteriorly with respect  to the tibial plafond, the distal tibia is perched on the talar dome. Transverse displaced fracture of the medial malleolus, distal fracture fragment remains aligned with the talus. Oblique displaced fracture of the distal fibula at the level of the ankle mortise, distal fracture fragment remains aligned with the talus. Diffuse soft tissue edema. IMPRESSION: Ankle fracture subluxation with displaced fractures of the medial and lateral malleoli. Talus and distal fracture fragment displaced laterally and posteriorly with respect to the tibial plafond. Electronically Signed   By: Jeb Levering M.D.   On: 05/22/2018 01:33    Review of Systems  Constitutional: Negative for chills and fever.  HENT: Negative for sore throat and tinnitus.   Eyes: Negative for blurred vision and redness.  Respiratory: Negative for cough and shortness of breath.   Cardiovascular: Negative for chest pain, palpitations, orthopnea and PND.  Gastrointestinal: Negative for abdominal pain, diarrhea, nausea and vomiting.  Genitourinary: Negative for dysuria, frequency and urgency.  Musculoskeletal: Positive for falls and joint pain. Negative for myalgias.  Skin: Negative for rash.       No lesions  Neurological: Negative for speech change, focal weakness and weakness.  Endo/Heme/Allergies: Does not bruise/bleed easily.       No temperature intolerance  Psychiatric/Behavioral: Negative for depression and suicidal ideas.    Blood pressure (!) 165/56, pulse 84, temperature 98.4 F (36.9 C), temperature source Oral, resp. rate 18, height 5' 3"  (1.6 m), weight 70.5 kg (155 lb 6.8 oz), SpO2 100 %. Physical Exam  Vitals reviewed. Constitutional: She is oriented to person, place, and time. She appears well-developed and well-nourished. No distress.  HENT:  Head: Normocephalic and  atraumatic.  Mouth/Throat: Oropharynx is clear and moist.  Eyes: Pupils are equal, round, and reactive to light. Conjunctivae and EOM are normal. No  scleral icterus.  Neck: Normal range of motion. Neck supple. No JVD present. No tracheal deviation present. No thyromegaly present.  Cardiovascular: Normal rate, regular rhythm and normal heart sounds. Exam reveals no gallop and no friction rub.  No murmur heard. Respiratory: Effort normal and breath sounds normal. No respiratory distress.  GI: Soft. Bowel sounds are normal. She exhibits no distension. There is no tenderness.  Genitourinary:  Genitourinary Comments: Deferred  Musculoskeletal: She exhibits edema.  Range of motion of right ankle limited by pain  Lymphadenopathy:    She has no cervical adenopathy.  Neurological: She is alert and oriented to person, place, and time. No cranial nerve deficit. She exhibits normal muscle tone.  Skin: Skin is warm and dry. No rash noted. No erythema.  Psychiatric: She has a normal mood and affect. Her behavior is normal. Judgment and thought content normal.     Assessment/Plan This is an 82 year old female admitted for right ankle fracture. 1.  Ankle fracture: Splinted; IV morphine as needed for severe pain.  Consult orthopedic surgery. 2.  Hypertension: Uncontrolled; continue clonidine, hydrochlorothiazide, diltiazem and losartan.  Labetalol as needed. 3.  Depression: Continue sertraline and Wellbutrin 4.  DVT prophylaxis: SCDs 5.  GI prophylaxis: None The patient is a full code.  Time spent on admission was and patient care approximately 45 minutes  Harrie Foreman, MD 05/22/2018, 8:01 AM

## 2018-05-22 NOTE — Progress Notes (Signed)
[facility] [place] NAME: Cindy Fitzpatrick    MR#:  [id number]  DATE OF BIRTH:  11-13-1935  SUBJECTIVE:  CHIEF COMPLAINT:   Chief Complaint  Patient presents with  . Ankle Pain  Patient seen today Has pain in the ankle No chest pain and shortness of breath  REVIEW OF SYSTEMS:    ROS  CONSTITUTIONAL: No documented fever. No fatigue, weakness. No weight gain, no weight loss.  EYES: No blurry or double vision.  ENT: No tinnitus. No postnasal drip. No redness of the oropharynx.  RESPIRATORY: No cough, no wheeze, no hemoptysis. No dyspnea.  CARDIOVASCULAR: No chest pain. No orthopnea. No palpitations. No syncope.  GASTROINTESTINAL: No nausea, no vomiting or diarrhea. No abdominal pain. No melena or hematochezia.  GENITOURINARY: No dysuria or hematuria.  ENDOCRINE: No polyuria or nocturia. No heat or cold intolerance.  HEMATOLOGY: No anemia. No bruising. No bleeding.  INTEGUMENTARY: No rashes. No lesions.  MUSCULOSKELETAL: No arthritis. No swelling. No gout.  Has ankle pain NEUROLOGIC: No numbness, tingling, or ataxia. No seizure-type activity.  PSYCHIATRIC: No anxiety. No insomnia. No ADD.   DRUG ALLERGIES:   Allergies  Allergen Reactions  . Codeine Other (See Comments)    Other reaction(s): Headache  . Hydrocodone-Acetaminophen Nausea And Vomiting  . Meperidine     Other reaction(s): Dizziness  . Niacin Er Other (See Comments)    Patient doesn't remember  . Other Nausea Only  . Propoxyphene Other (See Comments)    Other reaction(s): Dizziness  . Solifenacin Other (See Comments)  . Sulfa Antibiotics Other (See Comments)    Patient doesn' t remember  . Fish-Derived Products Rash    Weakness  . Nabumetone Rash  . Oxycodone-Acetaminophen Rash    Couldn't think or remember anything    VITALS:  Blood pressure 135/69, pulse 85, temperature (!) 97.5 F (36.4 C), temperature source Tympanic, resp. rate 18, height 5\' 3"  (1.6 m),  weight 70.3 kg (155 lb), SpO2 100 %.  PHYSICAL EXAMINATION:   Physical Exam  GENERAL:  82 y.o.-year-old patient lying in the bed with no acute distress.  EYES: Pupils equal, round, reactive to light and accommodation. No scleral icterus. Extraocular muscles intact.  HEENT: Head atraumatic, normocephalic. Oropharynx and nasopharynx clear.  NECK:  Supple, no jugular venous distention. No thyroid enlargement, no tenderness.  LUNGS: Normal breath sounds bilaterally, no wheezing, rales, rhonchi. No use of accessory muscles of respiration.  CARDIOVASCULAR: S1, S2 normal. No murmurs, rubs, or gallops.  ABDOMEN: Soft, nontender, nondistended. Bowel sounds present. No organomegaly or mass.  EXTREMITIES: No cyanosis, clubbing or edema b/l.    Splint noted in the right foot NEUROLOGIC: Cranial nerves II through XII are intact. No focal Motor or sensory deficits b/l.   PSYCHIATRIC: The patient is alert and oriented x 3.  SKIN: No obvious rash, lesion, or ulcer.   LABORATORY PANEL:   CBC Recent Labs  Lab 05/22/18 0355  WBC 9.9  HGB 13.1  HCT 38.2  PLT 179   ------------------------------------------------------------------------------------------------------------------ Chemistries  Recent Labs  Lab 05/22/18 0355  NA 139  K 4.0  CL 104  CO2 27  GLUCOSE 119*  BUN 17  CREATININE 0.84  CALCIUM 9.3  AST 35  ALT 18  ALKPHOS 58  BILITOT 0.8   ------------------------------------------------------------------------------------------------------------------  Cardiac Enzymes No results for input(s): TROPONINI in the last 168 hours. ------------------------------------------------------------------------------------------------------------------  RADIOLOGY:  Dg Ankle 2 Views Right  Result Date: 05/22/2018 CLINICAL DATA:  Fracture, postreduction.  EXAM: RIGHT ANKLE - 2 VIEW COMPARISON:  Pre reduction radiographs earlier this day. FINDINGS: Overlying splint material in place limiting  osseous and soft tissue fine detail. Improved alignment of distal tibia and fibular fractures postreduction with improved alignment of the tibial talar articulation. Slight residual lateral displacement of talus and distal fracture fragments. No new fracture. IMPRESSION: Improved alignment of medial and lateral malleolar fractures postreduction. Improved tibial talar alignment with mild residual lateral subluxation. Electronically Signed   By: Jeb Levering M.D.   On: 05/22/2018 02:45   Dg Ankle Complete Right  Result Date: 05/22/2018 CLINICAL DATA:  Post fall with right ankle pain, bruising, swelling and deformity. EXAM: RIGHT ANKLE - COMPLETE 3+ VIEW COMPARISON:  None. FINDINGS: Displaced ankle fracture subluxation. The talus is displaced laterally and posteriorly with respect to the tibial plafond, the distal tibia is perched on the talar dome. Transverse displaced fracture of the medial malleolus, distal fracture fragment remains aligned with the talus. Oblique displaced fracture of the distal fibula at the level of the ankle mortise, distal fracture fragment remains aligned with the talus. Diffuse soft tissue edema. IMPRESSION: Ankle fracture subluxation with displaced fractures of the medial and lateral malleoli. Talus and distal fracture fragment displaced laterally and posteriorly with respect to the tibial plafond. Electronically Signed   By: Jeb Levering M.D.   On: 05/22/2018 01:33   Ct Ankle Right Wo Contrast  Result Date: 05/22/2018 CLINICAL DATA:  Evaluate ankle fractures.  Preoperative assessment. EXAM: CT OF THE RIGHT ANKLE WITHOUT CONTRAST TECHNIQUE: Multidetector CT imaging of the right ankle was performed according to the standard protocol. Multiplanar CT image reconstructions were also generated. COMPARISON:  Radiographs 05/22/2018 FINDINGS: There is a displaced transverse fracture through the base of the medial malleolus with mild comminution. Avulsion fractures are noted at the  tip of the mid medial malleolus also. Small oblique coursing intra-articular fracture of the posterior malleolus. Small fracture fragments are noted in the tibiotalar joint. There is approximately 1 cm of lateral subluxation of the talus in relation to the tibia. Largely transverse fracture involving the lateral malleolus at and above the level of the ankle mortise. Lateral and posterior displacement of approximately 1 cm is noted. The talus is intact. The subtalar joints are maintained. No midfoot or hindfoot fractures are identified. Moderate midfoot degenerative changes. Diffuse and fairly marked osteoporosis. IMPRESSION: 1. Trimalleolar ankle fractures with moderate displacement and comminution. 2. Small fracture fragments are noted in the tibiotalar joint and there is approximately 1 cm of lateral subluxation of the talus in relation to the tibia. 3. The talus and other midfoot and hindfoot bony structures are intact. 4. Advanced osteoporosis. Electronically Signed   By: Marijo Sanes M.D.   On: 05/22/2018 09:31     ASSESSMENT AND PLAN:   82 year old female patient with history of GERD, hyperlipidemia, skin cancer currently under hospitalist service for ankle fracture  -Right ankle fracture Trimalleolar ankle fracture with displacement Currently under splint Orthopedic surgery today  -GERD Continue proton pump inhibitor  -DVT prophylaxis with subcu Lovenox daily after surgery  -Hypertension Continue losartan thiazide diuretic   All the records are reviewed and case discussed with Care Management/Social Worker. Management plans discussed with the patient, family and they are in agreement.  CODE STATUS: DNR  DVT Prophylaxis: SCDs  TOTAL TIME TAKING CARE OF THIS PATIENT: 35 minutes.   POSSIBLE D/C IN  1 to 2 DAYS, DEPENDING ON CLINICAL CONDITION.  Saundra Shelling M.D on 05/22/2018 at 2:32 PM  Between 7am to 6pm - Pager - [phone]  After 6pm go to [email or web address] - password [email or web address]  [facility]  Office  [phone]  CC: Primary care physician; [name], MD  Note: This dictation was prepared with Dragon dictation along with smaller phrase technology. Any transcriptional errors that result from this process are unintentional.

## 2018-05-22 NOTE — Progress Notes (Signed)
Patient is A+O with no signs of distress and denies pain at this time.

## 2018-05-22 NOTE — Clinical Social Work Note (Signed)
Clinical Social Work Assessment  Patient Details  Name: Cindy Fitzpatrick MRN: [id number] Date of Birth: 1936-06-02  Date of referral:  05/22/18               Reason for consult:  Facility Placement                Permission sought to share information with:  [facility] granted to share information::  Yes, Verbal Permission Granted  Name::      Cindy Fitzpatrick::     Relationship::     Contact Information:     Housing/Transportation Living arrangements for the past 2 months:  [place] of Information:  Patient Patient Interpreter Needed:  None Criminal Activity/Legal Involvement Pertinent to Current Situation/Hospitalization:  No - Comment as needed Significant Relationships:  Adult Children Lives with:  Self Do you feel safe going back to the place where you live?  Yes Need for family participation in patient care:  Yes (Comment)  Care giving concerns: Patient lives alone at [facility] ([facility] living at [facility]).   Social Worker assessment / plan: C[facility] (CSW) met with patient alone at bedside prior to surgery today. Patient has an ankle fracture, PT is pending. Patient was alert and oriented X4 and was laying in the bed. CSW introduced self and explained role of CSW department. Per patient she lives alone at [facility] and prefers to go back to independent living. CSW explained that PT will evaluate patient and make a recommendation of home health or SNF. CSW explained that [facility] will have to approve SNF. CSW also explained that [facility] will accept patient if needed. Patient verbalized her understanding and reported that she would consider to go to [facility]. FL2 complete and faxed out. [facility] admissions coordinator at [facility] is aware of above and will start [facility] authorization when closer to discharge. CSW will continue to follow and assist as needed.   Employment status:   Retired Nurse, adult PT Recommendations:  Not assessed at this time [facility] / Referral to community resources:  [facility]  Patient/Family's Response to care:  Patient will consider going to [facility].   Patient/Family's Understanding of and Emotional Response to Diagnosis, Current Treatment, and Prognosis:  Patient was very pleasant and thanked CSW for assistance.   Emotional Assessment Appearance:  Appears stated age Attitude/Demeanor/Rapport:    Affect (typically observed):  Accepting, Adaptable, Pleasant Orientation:  Oriented to Self, Oriented to Place, Oriented to  Time, Oriented to Situation Alcohol / Substance use:  Not Applicable Psych involvement (Current and /or in the community):  No (Comment)  Discharge Needs  Concerns to be addressed:  Discharge Planning Concerns Readmission within the last 30 days:  No Current discharge risk:  Dependent with Mobility Barriers to Discharge:  Continued Medical Work up   [facility], Cindy Beets, LCSW 05/22/2018, 12:12 PM

## 2018-05-22 NOTE — Op Note (Signed)
Operative Note    SURGERY DATE: 05/22/2018   PRE-OP DIAGNOSIS:  1. R ankle trimalleolar ankle fracture    POST-OP DIAGNOSIS:  1. R ankle trimalleolar ankle fracture   PROCEDURE(S): 1. ORIF of trimalleolar R ankle fracture (without fixation of posterior malleolus)    SURGEON: Cato Mulligan, MD    ANESTHESIA: Gen   ESTIMATED BLOOD LOSS: 50cc   DRAINS:  None   TOTAL IV FLUIDS: see anesthesia record  IMPLANTS: Stryker VariAx distal fibular plate 7 - 3.9JQ Stryker locking screws (lateral malleolus) 2 - 3.51mm Stryker cortical screws (medial malleolar fixation)   INDICATION(S): The patient is a 82 y.o. female who had a fall yesterday and noted immediate right ankle pain. Imaging showed a R ankle trimalleolar fracture. Fracture was reduced and splinted in the ED.  After discussion of risks, benefits, and alternatives to surgery, the patient elected to proceed.  She understands she is at higher risk of complication due to her age, co-morbidities, and likely cartilage damage from the injury itself.    OPERATIVE FINDINGS: R trimalleolar ankle fracture   OPERATIVE REPORT:   The patient was seen in the Holding Room. The risks, benefits, complications, treatment options, and expected outcomes were discussed with the patient. The risks and potential complications of the problem and purposed treatment include but are not limited to infection, bleeding, pain, stiffness, nerve and vessel injury, hardware failure, nonunion/malunion, incomplete pain relief, osteoarthritis, and complication secondary to the anesthetic. The patient concurred with the proposed plan, giving informed consent.  The site of surgery was properly noted/marked.   The patient was taken to Operating Room and transferred to the operating room table. A Time Out was held and the patient identity, procedure, and laterality was confirmed. After administration of adequate anesthesia, the entire lower extremity was prescrubbed with  Hibiclens and alcohol, prepped with Chloroprep, and draped in sterile fashion. The patient was given appropriate pre-operative IV antibiotics.   The tourniquet was inflated to 271mmHg after exsanguinating the leg with an Esmarch bandage. A standard distal fibular incision was made with a 15 blade along the lateral aspect of the fibula. Dissection was carried down to the fibula. The fracture site was identified and cleared of any tissue with a combination of dental pick, knife, and curette. The cortical shell of bone was attached to the periosteum and there was just cancellous bone identified at the fracture site. A K-wire was used to hold the lateral malleolus and pointed reduction clamp were used to hold the lateral malleolus in a reduced position. Reduction was confirmed visually and fluoroscopically. The fracture pattern did not accommodate a lag screw.   A Stryker VariAx Distal Fibula plate was selected after confirming appropriate size and position fluoroscopically. Three locking screws were placed in the distal fragment. Then a cortical screws was placed just proximal to the fracture fragment to bring the plate to bone. Due to the soft bone, locking screws were placed proximally and the cortical screw was also replaced with a locking screw.  Fluoroscopy then confirmed appropriate hardware position and reduction.  Next, a standard medial malleolar incision was made with a 15 blade along the medial aspect of the tibia.  Dissection was carried down sharply to the tibia. The periosteum overlying the fracture site was elevated. The fracture site was identified and cleared of any tissue with a combination of dental pick, knife, and curette. The joint was inspected and cleared of any debris. The joint was thoroughly irrigated.  The main medial malleolar  fracture was reduced anatomically under direct visualization and a K-wires were used to hold the reduction. Anatomic reduction was confirmed visually and  fluoroscopically. Then an anterior medial malleolar screw was drilled parallel to the previously placed K-wire. A 3.46mm cortical screw with washer was placed. This was repeated for the more posterior screw. This screw achieved bicortical fixation.  Fluoroscopy then confirmed appropriate hardware position and reduction. External rotation stress test was negative. Posterior malleolar fragment was small and in a reduced position. Therefore, no additional fixation was performed of the posterior malleolar fragment.  The wounds were then thoroughly irrigated.  Local anesthetic was placed about the incisions. The tourniquet was deflated with a time of 118 minutes. Hemostasis was achieved with bovie electrocautery. 0 Vicryl sutures were used to close the deep layer over the plate. 3-0 Vicryl was used to close the subdermal layers. 3-0 Nylon in a vertical mattress fashion was used to close the skin. The wounds were dressed with xeroform, fluffs, and cotton guaze.  The leg was then placed in a short leg splint.   The patient was awakened from anesthesia without any further complication and transferred to PACU for further recovery.    POST-OPERATIVE PLAN:  NWB for 6 weeks on R ankle. WBAT on RLE. ASA 325mg /day x 4 weeks for DVT ppx. PT/OT tomorrow. Ancef x 24 hrs.

## 2018-05-22 NOTE — Plan of Care (Signed)

## 2018-05-22 NOTE — Anesthesia Preprocedure Evaluation (Signed)
Anesthesia Evaluation  Patient identified by MRN, date of birth, ID band Patient awake    Reviewed: Allergy & Precautions, NPO status , Patient's Chart, lab work & pertinent test results  History of Anesthesia Complications Negative for: history of anesthetic complications  Airway Mallampati: II  TM Distance: >3 FB Neck ROM: Full    Dental  (+) Caps   Pulmonary neg pulmonary ROS, neg sleep apnea, neg COPD,    breath sounds clear to auscultation- rhonchi (-) wheezing      Cardiovascular hypertension, + CAD (RCA 1996) and + Cardiac Stents  (-) CABG  Rhythm:Regular Rate:Normal - Systolic murmurs and - Diastolic murmurs Echo 01/26/02: - Left ventricle: The cavity size was normal. Systolic function was   normal. The estimated ejection fraction was in the range of 50%   to 55%. Wall motion was normal; there were no regional wall   motion abnormalities. Left ventricular diastolic function   parameters were normal. - Aortic valve: Transvalvular velocity was increased. There was   mild stenosis. Valve area (VTI): 0.93 cm^2. - Mitral valve: There was moderate regurgitation. - Left atrium: The atrium was moderately dilated. - Right ventricle: The cavity size was mildly dilated. Wall   thickness was normal. Systolic function was normal. - Pulmonary arteries: Systolic pressure was moderately elevated. PA   peak pressure: 52 mm Hg (S).   Neuro/Psych PSYCHIATRIC DISORDERS Anxiety Depression Bipolar Disorder negative neurological ROS     GI/Hepatic Neg liver ROS, GERD  ,  Endo/Other  negative endocrine ROSneg diabetes  Renal/GU negative Renal ROS     Musculoskeletal  (+) Arthritis ,   Abdominal (+) - obese,   Peds  Hematology negative hematology ROS (+)   Anesthesia Other Findings Past Medical History: No date: Arthritis No date: Blood in stool No date: Cancer ([place])     Comment:  skin No date: Chicken pox     Comment:   SHINGLES TWICE No date: Colon polyps No date: Depression No date: Diverticulitis No date: GERD (gastroesophageal reflux disease) No date: Hay fever No date: Heart disease No date: Heart murmur No date: Hyperlipidemia No date: UTI (urinary tract infection) No date: Varicose veins of both lower extremities   Reproductive/Obstetrics                             Anesthesia Physical Anesthesia Plan  ASA: III  Anesthesia Plan: General   Post-op Pain Management:    Induction: Intravenous  PONV Risk Score and Plan: 2 and Ondansetron and Dexamethasone  Airway Management Planned: Oral ETT  Additional Equipment:   Intra-op Plan:   Post-operative Plan: Extubation in OR  Informed Consent: I have reviewed the patients History and Physical, chart, labs and discussed the procedure including the risks, benefits and alternatives for the proposed anesthesia with the patient or authorized representative who has indicated his/her understanding and acceptance.   Dental advisory given  Plan Discussed with: CRNA and Anesthesiologist  Anesthesia Plan Comments: (Pt has DNR order, we discussed plan for suspension of DNR order during periop period due to possible need for respiratory support, BP support and potential reactions to medications during the procedure. )        Anesthesia Quick Evaluation

## 2018-05-22 NOTE — H&P (Addendum)
H&P reviewed. No significant changes noted.  

## 2018-05-22 NOTE — ED Notes (Signed)
Dr brown at bedside for ankle reduction and splint placement, pt cont to deny pain at this time and during splinting pt rated her pain a 2/10

## 2018-05-22 NOTE — ED Notes (Signed)
Son, [name], at bedside, [phone], reports will go home at visit tomorrow

## 2018-05-22 NOTE — Transfer of Care (Signed)
Immediate Anesthesia Transfer of Care Note  Patient: Cindy Fitzpatrick  Procedure(s) Performed: OPEN REDUCTION INTERNAL FIXATION (ORIF) TRIMALLEOLAR  ANKLE FRACTURE (Right Ankle)  Patient Location: PACU  Anesthesia Type:General  Level of Consciousness: awake and responds to stimulation  Airway & Oxygen Therapy: Patient Spontanous Breathing and Patient connected to face mask oxygen  Post-op Assessment: Report given to RN and Post -op Vital signs reviewed and stable  Post vital signs: Reviewed and stable  Last Vitals:  Vitals Value Taken Time  BP 160/57 05/22/2018  4:01 PM  Temp    Pulse 76 05/22/2018  4:01 PM  Resp 11 05/22/2018  4:01 PM  SpO2 100 % 05/22/2018  4:01 PM  Vitals shown include unvalidated device data.  Last Pain:  Vitals:   05/22/18 1148  TempSrc: Tympanic  PainSc: 6       Patients Stated Pain Goal: 2 (85/46/27 0350)  Complications: No apparent anesthesia complications

## 2018-05-22 NOTE — Anesthesia Procedure Notes (Signed)

## 2018-05-22 NOTE — Progress Notes (Signed)
PASARR went to a level 2 screen. Clinical Education officer, museum (CSW) received a call from [employer] who stated that she will come assess patient tomorrow afternoon.   [employer], LCSW [phone]

## 2018-05-22 NOTE — Anesthesia Postprocedure Evaluation (Signed)
Anesthesia Post Note  Patient: Cindy Fitzpatrick  Procedure(s) Performed: OPEN REDUCTION INTERNAL FIXATION (ORIF) TRIMALLEOLAR  ANKLE FRACTURE (Right Ankle)  Patient location during evaluation: PACU Anesthesia Type: General Level of consciousness: awake and alert Pain management: pain level controlled Vital Signs Assessment: post-procedure vital signs reviewed and stable Respiratory status: spontaneous breathing and respiratory function stable Cardiovascular status: stable Anesthetic complications: no     Last Vitals:  Vitals:   05/22/18 1148 05/22/18 1601  BP: 135/69 (!) 160/57  Pulse: 85 75  Resp: 18 13  Temp: (!) 36.4 C (!) 36.3 C  SpO2: 100% 100%    Last Pain:  Vitals:   05/22/18 1148  TempSrc: Tympanic  PainSc: 6                  Dyann Goodspeed K

## 2018-05-22 NOTE — Plan of Care (Signed)
  Problem: Education: Goal: Knowledge of General Education information will improve Description Including pain rating scale, medication(s)/side effects and non-pharmacologic comfort measures 05/22/2018 0629 by Bryna Colander, RN Outcome: Progressing 05/22/2018 0455 by Bryna Colander, RN Outcome: Progressing   Problem: Health Behavior/Discharge Planning: Goal: Ability to manage health-related needs will improve 05/22/2018 0629 by Alpha Mysliwiec, Lucille Passy, RN Outcome: Progressing 05/22/2018 0455 by Bryna Colander, RN Outcome: Progressing   Problem: Clinical Measurements: Goal: Ability to maintain clinical measurements within normal limits will improve 05/22/2018 0629 by Lauralei Clouse, Lucille Passy, RN Outcome: Progressing 05/22/2018 0455 by Bryna Colander, RN Outcome: Progressing Goal: Will remain free from infection 05/22/2018 0629 by Bryna Colander, RN Outcome: Progressing 05/22/2018 0455 by Bryna Colander, RN Outcome: Progressing Goal: Diagnostic test results will improve 05/22/2018 0629 by Bryna Colander, RN Outcome: Progressing 05/22/2018 0455 by Bryna Colander, RN Outcome: Progressing Goal: Respiratory complications will improve 05/22/2018 0629 by Bryna Colander, RN Outcome: Progressing 05/22/2018 0455 by Bryna Colander, RN Outcome: Progressing Goal: Cardiovascular complication will be avoided 05/22/2018 0629 by Bryna Colander, RN Outcome: Progressing 05/22/2018 0455 by Bryna Colander, RN Outcome: Progressing   Problem: Activity: Goal: Risk for activity intolerance will decrease 05/22/2018 0629 by Bryna Colander, RN Outcome: Progressing 05/22/2018 0455 by Bryna Colander, RN Outcome: Progressing   Problem: Nutrition: Goal: Adequate nutrition will be maintained 05/22/2018 0629 by Bryna Colander, RN Outcome: Progressing 05/22/2018 0455 by Kriss Ishler, Lucille Passy, RN Outcome: Progressing

## 2018-05-22 NOTE — Anesthesia Post-op Follow-up Note (Signed)
Anesthesia QCDR form completed.        

## 2018-05-22 NOTE — NC FL2 (Signed)
[place] LEVEL OF CARE SCREENING TOOL     IDENTIFICATION  Patient Name: Cindy Fitzpatrick Birthdate: 01/03/1936 Sex: female Admission Date (Current Location): 05/22/2018  [place] and [place] Number:  Engineering geologist and Address:  [facility], [address] [place], [place] [address]      Provider Number: [id number]  Attending Physician Name and Address:  Saundra Shelling, MD  Relative Name and Phone Number:       Current Level of Care: Hospital Recommended Level of Care: [place] Prior Approval Number:    Date Approved/Denied:   PASRR Number:    Discharge Plan: SNF    Current Diagnoses: Patient Active Problem List   Diagnosis Date Noted  . Ankle fracture 05/22/2018  . Chronic constipation 04/19/2018  . Anxiety disorder 04/19/2018  . Traumatic hematoma of left lower leg 02/17/2018  . Blister 11/30/2017  . Acute cystitis without hematuria 11/15/2017  . Dark stools 08/26/2016  . Diverticulitis of small intestine without perforation or abscess with bleeding   . Ischemic chest pain   . History of coronary artery stent placement   . Heart murmur 04/05/2016  . Umbilical hernia 70/62/3762  . Osteopenia 01/21/2016  . Fall at home 12/25/2015  . Chronic pain syndrome 02/13/2015  . Fatigue 02/13/2015  . Obesity (BMI 30-39.9) 12/01/2014  . Bipolar affective disorder ([place]) 03/25/2014  . Rheumatoid arthritis ([place]) 03/25/2014  . CAD in native artery 03/25/2014  . Acid reflux 03/25/2014  . Benign essential HTN 03/25/2014  . Adaptive colitis 03/25/2014  . HLD (hyperlipidemia) 03/25/2014    Orientation RESPIRATION BLADDER Height & Weight     Self, Time, Situation, Place  Normal Incontinent Weight: 70.5 kg (155 lb 6.8 oz) Height:  5\' 3"  (160 cm)  BEHAVIORAL SYMPTOMS/MOOD NEUROLOGICAL BOWEL NUTRITION STATUS      Continent Diet(Diet: NPO for surgery to be advanced. )  AMBULATORY STATUS COMMUNICATION OF NEEDS Skin    Extensive Assist Verbally Surgical wounds                       Personal Care Assistance Level of Assistance  Bathing, Feeding, Dressing Bathing Assistance: Limited assistance Feeding assistance: Independent Dressing Assistance: Limited assistance     Functional Limitations Info  Sight, Hearing, Speech Sight Info: Adequate Hearing Info: Impaired Speech Info: Adequate    SPECIAL CARE FACTORS FREQUENCY  PT (By licensed PT), OT (By licensed OT)     PT Frequency: (5) OT Frequency: (5)            Contractures      Additional Factors Info  Code Status, Allergies Code Status Info: (DNR ) Allergies Info: (Codeine, Hydrocodone-acetaminophen, Meperidine, Niacin Er Other, Propoxyphene, Solifenacin, Sulfa Antibiotics, Fish-derived Products, Nabumetone, Oxycodone-acetaminophen)           Current Medications (05/22/2018):  This is the current hospital active medication list Current Facility-Administered Medications  Medication Dose Route Frequency Provider Last Rate Last Dose  . acetaminophen (TYLENOL) tablet 650 mg  650 mg Oral Q6H PRN Harrie Foreman, MD       Or  . acetaminophen (TYLENOL) suppository 650 mg  650 mg Rectal Q6H PRN Harrie Foreman, MD      . acetaminophen (TYLENOL) tablet 1,000 mg  1,000 mg Oral QHS Harrie Foreman, MD      . buPROPion [facility] SR) 12 hr tablet 150 mg  150 mg Oral BID Harrie Foreman, MD   150 mg at 05/22/18  1002  . cloNIDine (CATAPRES) tablet 0.1 mg  0.1 mg Oral BID Harrie Foreman, MD   0.1 mg at 05/22/18 1004  . diltiazem (CARDIZEM) tablet 30 mg  30 mg Oral TID Harrie Foreman, MD   30 mg at 05/22/18 1004  . docusate sodium (COLACE) capsule 100 mg  100 mg Oral BID Harrie Foreman, MD   100 mg at 05/22/18 1005  . losartan (COZAAR) tablet 50 mg  50 mg Oral Daily Harrie Foreman, MD   50 mg at 05/22/18 1003   And  . hydrochlorothiazide (MICROZIDE) capsule 12.5 mg  12.5 mg Oral Daily Harrie Foreman, MD   12.5  mg at 05/22/18 1003  . ondansetron (ZOFRAN) tablet 4 mg  4 mg Oral Q6H PRN Harrie Foreman, MD       Or  . ondansetron [facility]) injection 4 mg  4 mg Intravenous Q6H PRN Harrie Foreman, MD   4 mg at 05/22/18 1015  . sertraline (ZOLOFT) tablet 200 mg  200 mg Oral QHS Harrie Foreman, MD      . traMADol [facility]) tablet 50 mg  50 mg Oral Q6H PRN Harrie Foreman, MD   50 mg at 05/22/18 1004     Discharge Medications: Please see discharge summary for a list of discharge medications.  Relevant Imaging Results:  Relevant Lab Results:   Additional Information (SSN: [id number])  Sajan Cheatwood, Veronia Beets, LCSW

## 2018-05-23 LAB — COMPREHENSIVE METABOLIC PANEL
ALBUMIN: 3.5 g/dL (ref 3.5–5.0)
ALT: 274 U/L — ABNORMAL HIGH (ref 0–44)
AST: 279 U/L — ABNORMAL HIGH (ref 15–41)
Alkaline Phosphatase: 80 U/L (ref 38–126)
Anion gap: 6 (ref 5–15)
BUN: 13 mg/dL (ref 8–23)
CO2: 29 mmol/L (ref 22–32)
Calcium: 8.4 mg/dL — ABNORMAL LOW (ref 8.9–10.3)
Chloride: 106 mmol/L (ref 98–111)
Creatinine, Ser: 0.7 mg/dL (ref 0.44–1.00)
GFR calc Af Amer: >60 mL/min (ref >60)
GFR calc non Af Amer: >60 mL/min (ref >60)
GLUCOSE: 114 mg/dL — AB (ref 70–99)
POTASSIUM: 3.8 mmol/L (ref 3.5–5.1)
SODIUM: 141 mmol/L (ref 135–145)
TOTAL PROTEIN: 5.5 g/dL — AB (ref 6.5–8.1)
Total Bilirubin: 0.6 mg/dL (ref 0.3–1.2)

## 2018-05-23 MED ORDER — OXYCODONE HCL 5 MG PO TABS: 5.0000 mg | ORAL_TABLET | ORAL | 0 refills | Status: DC | PRN

## 2018-05-23 MED ORDER — OXYCODONE HCL 5 MG PO TABS: 2.5000 mg | ORAL_TABLET | ORAL | Status: DC | PRN

## 2018-05-23 MED ORDER — ASPIRIN 325 MG PO TBEC: 325.0000 mg | DELAYED_RELEASE_TABLET | Freq: Every day | ORAL | 0 refills | Status: DC

## 2018-05-23 MED ORDER — TRAMADOL HCL 50 MG PO TABS: 50.0000 mg | ORAL_TABLET | Freq: Four times a day (QID) | ORAL | 1 refills | Status: DC | PRN

## 2018-05-23 NOTE — Progress Notes (Signed)
Per Pamala Hurry level 2 PASARR screener she will come see patient today.   [employer], LCSW [phone]

## 2018-05-23 NOTE — Plan of Care (Signed)

## 2018-05-23 NOTE — Discharge Instructions (Signed)
Ankle Fracture Surgery °  °Post-Op Instructions °  °1. Bracing or crutches: Crutches will be provided at the time of discharge. °  °2. Splint/Cast: You will have a splint (3/4 cast) on your leg after surgery. Ensure that this remains clean and dry until follow up appointment. If this becomes wet, you need to call our offices to get it changed or else you risk skin breakdown.   °  °3. Driving:  Plan on not driving for at least four to six weeks. Please note that you are advised NOT to drive while taking narcotic pain medications as you may be impaired and unsafe to drive. °  °4. Activity: Ankle pumps several times an hour while awake to prevent blood clots. Weight bearing: Non-weight bearing. Use crutches for at least 6 weeks, if not longer based on your surgery. Bending and straightening the knee is unlimited. Elevate knee above heart level as much as possible for one week. Avoid standing more than 5 minutes (consecutively) for the first week.  Avoid long distance travel for 4 weeks. °  °5. Medications:  °- You have been provided a prescription for narcotic pain medicine. After surgery, take 1-2 narcotic tablets every 4 hours if needed for severe pain. Please start this as soon as you begin to start having pain (if you received a nerve block, start taking as soon as this wears off).  °- A prescription for anti-nausea medication will be provided in case the narcotic medicine causes nausea - take 1 tablet every 6 hours only if nauseated.  °- Take enteric coated aspirin 325 mg once daily for 4 weeks to prevent blood clots.  °-Take tylenol 1000 mg every 8 hours for pain.  May stop tylenol 5 days after surgery if you are having minimal pain. °  °If you are taking prescription medication for anxiety, depression, insomnia, muscle spasm, chronic pain, or for attention deficit disorder you are advised that you are at a higher risk of adverse effects with use of narcotics post-op, including narcotic addiction/dependence,  depressed breathing, death. °If you use non-prescribed substances: alcohol, marijuana, cocaine, heroin, methamphetamines, etc., you are at a higher risk of adverse effects with use of narcotics post-op, including narcotic addiction/dependence, depressed breathing, death. °You are advised that taking > 50 morphine milligram equivalents (MME) of narcotic pain medication per day results in twice the risk of overdose or death. For your prescription provided: oxycodone 5 mg - taking more than 6 tablets per day. Be advised that we will prescribe narcotics short-term, for acute post-operative pain only - 1 week for minor operations such as knee arthroscopy for meniscus tear resection, and 3 weeks for major operations such as knee repair/reconstruction surgeries.  °  °6. Physical Therapy: Plan to start after follow up appointment at 2 weeks. 1-2 times per week for ~12-16 weeks. Therapy typically starts on post operative Day 3 or 4. You have been provided an order for physical therapy. The therapist will provide home exercises. Please contact our offices if this appointment has not been scheduled.  °  °7. Work/School: May return to full work when off of crutches. May do light duty/desk job or return to school in approximately 1-2 weeks when off of narcotics, pain is well-controlled, and swelling has decreased. °  °8. Post-Op Appointments: °Your first post-op appointment will be with Dr. Patel in approximately 2 weeks time.  °  °If you find that they have not been scheduled please call the Orthopaedic Appointment front desk at [phone]. ° ° ° ° ° °

## 2018-05-23 NOTE — Progress Notes (Signed)
  Subjective: 1 Day Post-Op Procedure(s) (LRB): OPEN REDUCTION INTERNAL FIXATION (ORIF) TRIMALLEOLAR  ANKLE FRACTURE (Right) Patient reports pain as mild.   Patient seen in rounds with Dr. Posey Pronto. Patient is well, and has had no acute complaints or problems Plan is to go Skilled nursing facility after hospital stay. Negative for chest pain and shortness of breath Fever: no Gastrointestinal: Negative for nausea and vomiting  Objective: Vital signs in last 24 hours: Temp:  [97.3 F (36.3 C)-98.2 F (36.8 C)] 97.8 F (36.6 C) (07/30 0459) Pulse Rate:  [64-85] 73 (07/30 0459) Resp:  [13-18] 16 (07/30 0459) BP: (126-160)/(45-72) 140/54 (07/30 0459) SpO2:  [88 %-100 %] 96 % (07/30 0459) Weight:  [70.3 kg (155 lb)] 70.3 kg (155 lb) (07/29 1148)  Intake/Output from previous day:  Intake/Output Summary (Last 24 hours) at 05/23/2018 0611 Last data filed at 05/23/2018 0420 Gross per 24 hour  Intake 750 ml  Output 1225 ml  Net -475 ml    Intake/Output this shift: Total I/O In: -  Out: 1000 [Urine:1000]  Labs: Recent Labs    05/22/18 0355  HGB 13.1   Recent Labs    05/22/18 0355  WBC 9.9  RBC 4.39  HCT 38.2  PLT 179   Recent Labs    05/22/18 0355 05/23/18 0407  NA 139 141  K 4.0 3.8  CL 104 106  CO2 27 29  BUN 17 13  CREATININE 0.84 0.70  GLUCOSE 119* 114*  CALCIUM 9.3 8.4*   Recent Labs    05/22/18 0355  INR 0.95     EXAM General - Patient is Alert and Oriented Extremity - Sensation intact distally Dorsiflexion/Plantar flexion intact Dressing/Incision - clean, dry, no drainage, with a splint intact Motor Function - intact, moving toes well on exam.   Past Medical History:  Diagnosis Date  . Arthritis   . Blood in stool   . Cancer ([place])    skin  . Chicken pox    SHINGLES TWICE  . Colon polyps   . Depression   . Diverticulitis   . GERD (gastroesophageal reflux disease)   . Hay fever   . Heart disease   . Heart murmur   . Hyperlipidemia   .  UTI (urinary tract infection)   . Varicose veins of both lower extremities     Assessment/Plan: 1 Day Post-Op Procedure(s) (LRB): OPEN REDUCTION INTERNAL FIXATION (ORIF) TRIMALLEOLAR  ANKLE FRACTURE (Right) Active Problems:   Ankle fracture  Estimated body mass index is 27.46 kg/m as calculated from the following:   Height as of this encounter: 5\' 3"  (1.6 m).   Weight as of this encounter: 70.3 kg (155 lb). [place] diet Up with therapy D/C IV fluids Discharge to SNF when cleared by medicine. Nonweightbearing for 6 weeks on right ankle. Aspirin 325 mg daily for 4 weeks.  Reche Dixon, PA-C Orthopaedic Surgery 05/23/2018, 6:11 AM

## 2018-05-23 NOTE — Evaluation (Signed)
Occupational Therapy Evaluation Patient Details Name: Cindy Fitzpatrick MRN: [id number] DOB: June 01, 1936 Today's Date: 05/23/2018    History of Present Illness Pt 82 y.o. female that presents to the hospital s/p ORIF for trimalleolar ankle fracture R side secondary to falling at home. Pt PMH includes HTN, CAD, hyperlipidemia, arthritis, GERD, and skin cancer.    Clinical Impression   Pt seen for OT evaluation this date. Prior to hospital admission, pt was in independent living, she was walking with 4WW within her apartment, she reports not leaving often and her son providing groceries, she has a housekeeper that comes 1x/wk.  Pt reports being independent with self care with no adaptive equipment.  Currently pt demonstrates impairments in ADL transfers, ADL mobility, LB ADLs including dressing and bathing requiring MIN/MOD with t/f's and MAX A for LB ADLs. Pt would benefit from skilled OT to address noted impairments and functional limitations (see below for any additional details) in order to maximize safety and independence while minimizing falls risk and caregiver burden.  Upon hospital discharge, recommend pt discharge to SNF.    Follow Up Recommendations  SNF    Equipment Recommendations  3 in 1 bedside commode    Recommendations for Other Services       Precautions / Restrictions Precautions Precautions: Fall Required Braces or Orthoses: Other Brace/Splint Restrictions Weight Bearing Restrictions: Yes RLE Weight Bearing: Weight bearing as tolerated      Mobility Bed Mobility Overal bed mobility: Needs Assistance Bed Mobility: Sit to Supine     Supine to sit: Min assist     General bed mobility comments: Min A at R LE secondary to pain and weakness.  Transfers Overall transfer level: Needs assistance Equipment used: Rolling walker (2 wheeled) Transfers: Sit to/from [facility] Sit to Stand: Min assist Stand pivot transfers: Mod assist       General  transfer comment: Mod A at gait belt for safety and weakness. Pt benefited from elevated surface to stand from.    Balance Overall balance assessment: Needs assistance Sitting-balance support: No upper extremity supported;Feet supported Sitting balance-Leahy Scale: Good     Standing balance support: Bilateral upper extremity supported Standing balance-Leahy Scale: Poor                             ADL either performed or assessed with clinical judgement   ADL Overall ADL's : Needs assistance/impaired             Lower Body Bathing: Moderate assistance       Lower Body Dressing: Maximal assistance               Functional mobility during ADLs: Rolling walker;Moderate assistance General ADL Comments: Pt demonstrates difficulty adhering to NWB RLE during SPS from reclnier to bed adjacent on the left.     Vision Baseline Vision/History: Wears glasses Wears Glasses: At all times Patient Visual Report: No change from baseline       Perception     Praxis      Pertinent Vitals/Pain Pain Assessment: 0-10 Pain Score: 6  Pain Location: R ankle Pain Descriptors / Indicators: Grimacing;Guarding;Discomfort Pain Intervention(s): Limited activity within patient's tolerance;Monitored during session;Repositioned     Hand Dominance Right   Extremity/Trunk Assessment Upper Extremity Assessment Upper Extremity Assessment: Generalized weakness   Lower Extremity Assessment Lower Extremity Assessment: Generalized weakness;RLE deficits/detail RLE: Unable to fully assess due to immobilization;Unable to fully assess due to pain  Communication Communication Communication: HOH   Cognition Arousal/Alertness: Awake/alert Behavior During Therapy: WFL for tasks assessed/performed Overall Cognitive Status: Within Functional Limits for tasks assessed                                 General Comments: Pt does change subject and has to be redirected  to task ocasionally   General Comments       Exercises Exercises: Other exercises Other Exercises Other Exercises: Pt educated on safe hand/foot placement with use of FWW for sit<>stand and SPS. Pt demonstrates understanding.  Other Exercises: Pt educated on NWB RLE precautions, but has difficulty demonstrating carryover during t/f requiring moderate verbal cues.    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         OT Problem List: Decreased strength;Decreased range of motion;Decreased activity tolerance;Impaired balance (sitting and/or standing);Impaired vision/perception;Decreased knowledge of use of DME or AE;Decreased knowledge of precautions;Pain      OT Treatment/Interventions: Self-care/ADL training;Therapeutic exercise;Neuromuscular education;Energy conservation;DME and/or AE instruction;Therapeutic activities;Patient/family education;Balance training    OT Goals(Current goals can be found in the care plan section) Acute Rehab OT Goals Patient Stated Goal: return home and reduce pain OT Goal Formulation: With patient Time For Goal Achievement: 06/06/18 Potential to Achieve Goals: Good ADL Goals Pt Will Transfer to Toilet: with min guard assist;bedside commode(with FWW) Additional ADL Goal #1: Pt will tolerate 2 min stand with FWW with CGA with minimal verbal cues to adhere to NWB R LE.  OT Frequency: Min  1X/week   Barriers to D/C:            Co-evaluation              AM-PAC PT "6 Clicks" Daily Activity     Outcome Measure Help from another person eating meals?: None Help from another person taking care of personal grooming?: A Little Help from another person toileting, which includes using toliet, bedpan, or urinal?: A Lot Help from another person bathing (including washing, rinsing, drying)?: A Lot Help from another person to put on and taking off regular upper body clothing?: None Help from another person to put on and taking off regular lower body clothing?: A Lot 6 Click Score: 17   End of Session Equipment Utilized During Treatment: Gait belt;Rolling walker Nurse Communication: Mobility status;Other (comment)(need for external catheter check)  Activity Tolerance: Patient tolerated treatment well Patient left: in bed;with call bell/phone within reach;with bed alarm set;with SCD's reapplied  OT Visit Diagnosis: Repeated falls (R29.6);Other abnormalities of gait and mobility (R26.89);Muscle weakness (generalized) (M62.81)                Time: 4270-6237 OT Time Calculation (min): 31 min Charges:  OT General  Charges $OT Visit: 1 Visit OT Evaluation $OT Eval Moderate Complexity: 1 Mod OT Treatments $Self Care/Home Management : 8-22 mins  Gerrianne Scale, MS, OTR/L ascom [phone] or [phone] 05/23/18, 2:15 PM

## 2018-05-23 NOTE — Progress Notes (Signed)
Per [facility] admissions coordinator at [facility] authorization has been received. [facility] is still pending.   [employer], LCSW [phone]

## 2018-05-23 NOTE — Progress Notes (Signed)
PT Cancellation Note  Patient Details Name: Cindy Fitzpatrick MRN: [id number] DOB: [birth year]   Cancelled Treatment:    Reason Eval/Treat Not Completed: Pain limiting ability to participate PT attempted treatment but pt requested not to participate secondary to increased pain. Nurse was informed and PT will reassess later.  Rosario Adie, SPT    Rosario Adie 05/23/2018, 2:04 PM

## 2018-05-23 NOTE — Evaluation (Signed)
Physical Therapy Evaluation Patient Details Name: Cindy Fitzpatrick MRN: [id number] DOB: Jun 17, 1936 Today's Date: 05/23/2018   History of Present Illness  Pt 82 y.o. female that presents to the hospital s/p ORIF for trimalleolar ankle fracture R side secondary to falling at home. Pt PMH includes HTN, CAD, hyperlipidemia, arthritis, GERD, and skin cancer.   Clinical Impression  Pt is a pleasant 82 year old female who was admitted for s/p ORIF for trimalleolar ankle fracture. Pt performs bed mobility with Min A at R LE secondary to pain and weakness, transfers with Mod A for sit<>stand and elevated surface secondary to pain and weakness, and ambulation with Min A and shuffling of L foot to ambulate 1' to recliner. Pt needs heavy verbal and tactile cueing to stay compliant with weight bearing restrictions and for proper use of RW. Pt demonstrates deficits with functional mobility secondary to pain, weakness, fatigue, balance, and knowledge of DME. Pt would benefit from skilled PT to address above deficits and promote optimal return to PLOF. PT recommends d/c to SNF at this time.      Follow Up Recommendations SNF    Equipment Recommendations       Recommendations for Other Services       Precautions / Restrictions Precautions Precautions: Fall Required Braces or Orthoses: Other Brace/Splint Restrictions Weight Bearing Restrictions: Yes RLE Weight Bearing: Weight bearing as tolerated      Mobility  Bed Mobility Overal bed mobility: Needs Assistance Bed Mobility: Sit to Supine     Supine to sit: Min assist     General bed mobility comments: Min A at R LE secondary to pain and weakness.  Transfers Overall transfer level: Needs assistance Equipment used: Rolling walker (2 wheeled) Transfers: Sit to/from [facility] Sit to Stand: Mod assist         General transfer comment: Mod A at gait belt for safety and weakness. Pt benefited from elevated surface to stand  from.  Ambulation/Gait Ambulation/Gait assistance: Min assist Gait Distance (Feet): 1 Feet Assistive device: Rolling walker (2 wheeled)       General Gait Details: Pt benefits from verbal andf tactile cueing to maintain weight bearing restrictions on R LE. Pt is not able to clear ground of L LE with B UE use on RW, thus 1" shuffle to recliner. Pt required Min A for safety, balance, weakness, and fatigue.  Stairs            Wheelchair Mobility    Modified Rankin (Stroke Patients Only)       Balance Overall balance assessment: Needs assistance Sitting-balance support: No upper extremity supported;Feet supported(no weight through R)       Standing balance support: Bilateral upper extremity supported                                 Pertinent Vitals/Pain Pain Assessment: 0-10 Pain Score: 6  Pain Location: R ankle Pain Descriptors / Indicators: Grimacing;Guarding;Discomfort Pain Intervention(s): Limited activity within patient's tolerance;Monitored during session;Repositioned    Home Living Family/patient expects to be discharged to:: (independent living apartments)               Home Equipment: Environmental consultant - 4 wheels Additional Comments: reports hx falls d/t 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      Hand Dominance   Dominant Hand: Right    Extremity/Trunk Assessment   Upper Extremity Assessment Upper Extremity Assessment: Generalized weakness    Lower Extremity Assessment Lower Extremity Assessment: Generalized weakness;RLE deficits/detail RLE: Unable to fully assess due to immobilization;Unable to fully assess due to  pain       Communication   Communication: HOH  Cognition Arousal/Alertness: Awake/alert Behavior During Therapy: WFL for tasks assessed/performed Overall Cognitive Status: Within Functional Limits for tasks assessed                                 General Comments: Pt does change subject and has to be redirected to task ocasionally      General Comments      Exercises Other Exercises Other Exercises: pt instructed in supine there.ex to include ankle pumps x10, quad sets x10, Min A hip abd/add x10, Min A heel slides x10, and Min A SLR x10.   Assessment/Plan    PT Assessment Patient needs continued PT services  PT Problem List Decreased strength;Decreased range of motion;Decreased activity tolerance;Decreased balance;Decreased mobility;Decreased knowledge of use of DME;Pain       PT Treatment Interventions DME instruction;Gait training;Functional mobility training;Therapeutic activities;Therapeutic exercise;Balance training;Patient/family education    PT Goals (Current goals can be found in the Care Plan section)  Acute Rehab PT Goals Patient Stated Goal: return home and reduce pain PT Goal Formulation: With patient Time For Goal Achievement: 06/06/18 Potential to Achieve Goals: Fair    Frequency BID   Barriers to discharge Decreased caregiver support(pt was living alone)      Co-evaluation               AM-PAC PT "6 Clicks" Daily Activity  Outcome Measure Difficulty turning over in bed (including adjusting bedclothes, sheets and blankets)?: Unable Difficulty moving from lying on back to sitting on the side of the bed? : Unable Difficulty sitting down on and standing up from a chair with arms (e.g., wheelchair, bedside commode, etc,.)?: Unable Help needed moving to and from a bed to chair (including a wheelchair)?: A Lot Help needed walking in hospital room?: Total Help needed climbing 3-5 steps with a railing? : Total 6 Click Score: 7    End  of Session Equipment Utilized During Treatment: Gait belt Activity Tolerance: Patient limited by fatigue;Patient limited by pain Patient left: in chair;with call bell/phone within reach;with chair alarm set Nurse Communication: Mobility status(and removal of external catheter) PT Visit Diagnosis: Unsteadiness on feet (R26.81);Other abnormalities of gait and mobility (R26.89);Repeated falls (R29.6);History of falling (Z91.81);Muscle weakness (generalized) (M62.81);Difficulty in walking, not elsewhere classified (R26.2);Pain Pain - Right/Left: Right Pain - part of body: Ankle and joints of foot    Time: 0177-9390 PT Time Calculation (min) (ACUTE ONLY): 36 min   Charges:              Rosario Adie, SPT   Rosario Adie 05/23/2018, 1:56 PM

## 2018-05-23 NOTE — Progress Notes (Signed)
[facility] NAME: Cindy Fitzpatrick    MR#:  [id number]  DATE OF BIRTH:  1936/06/28  SUBJECTIVE:  CHIEF COMPLAINT:   Chief Complaint  Patient presents with  . Ankle Pain  Patient seen today Has decreased pain in the ankle No chest pain and shortness of breath Tolerating diet ok  REVIEW OF SYSTEMS:    ROS  CONSTITUTIONAL: No documented fever. No fatigue, weakness. No weight gain, no weight loss.  EYES: No blurry or double vision.  ENT: No tinnitus. No postnasal drip. No redness of the oropharynx.  RESPIRATORY: No cough, no wheeze, no hemoptysis. No dyspnea.  CARDIOVASCULAR: No chest pain. No orthopnea. No palpitations. No syncope.  GASTROINTESTINAL: No nausea, no vomiting or diarrhea. No abdominal pain. No melena or hematochezia.  GENITOURINARY: No dysuria or hematuria.  ENDOCRINE: No polyuria or nocturia. No heat or cold intolerance.  HEMATOLOGY: No anemia. No bruising. No bleeding.  INTEGUMENTARY: No rashes. No lesions.  MUSCULOSKELETAL: No arthritis. No swelling. No gout.  Has decreased ankle pain NEUROLOGIC: No numbness, tingling, or ataxia. No seizure-type activity.  PSYCHIATRIC: No anxiety. No insomnia. No ADD.   DRUG ALLERGIES:   Allergies  Allergen Reactions  . Codeine Other (See Comments)    Other reaction(s): Headache  . Hydrocodone-Acetaminophen Nausea And Vomiting  . Meperidine     Other reaction(s): Dizziness  . Niacin Er Other (See Comments)    Patient doesn't remember  . Other Nausea Only  . Propoxyphene Other (See Comments)    Other reaction(s): Dizziness  . Solifenacin Other (See Comments)  . Sulfa Antibiotics Other (See Comments)    Patient doesn' t remember  . Fish-Derived Products Rash    Weakness  . Nabumetone Rash  . Oxycodone-Acetaminophen Rash    Couldn't think or remember anything    VITALS:  Blood pressure (!) 135/91, pulse 78, temperature 97.6 F (36.4 C), temperature source Oral, resp.  rate 18, height 5\' 3"  (1.6 m), weight 70.3 kg (155 lb), SpO2 100 %.  PHYSICAL EXAMINATION:   Physical Exam  GENERAL:  82 y.o.-year-old patient lying in the bed with no acute distress.  EYES: Pupils equal, round, reactive to light and accommodation. No scleral icterus. Extraocular muscles intact.  HEENT: Head atraumatic, normocephalic. Oropharynx and nasopharynx clear.  NECK:  Supple, no jugular venous distention. No thyroid enlargement, no tenderness.  LUNGS: Normal breath sounds bilaterally, no wheezing, rales, rhonchi. No use of accessory muscles of respiration.  CARDIOVASCULAR: S1, S2 normal. No murmurs, rubs, or gallops.  ABDOMEN: Soft, nontender, nondistended. Bowel sounds present. No organomegaly or mass.  EXTREMITIES: No cyanosis, clubbing or edema b/l.    Bandage to right foot NEUROLOGIC: Cranial nerves II through XII are intact. No focal Motor or sensory deficits b/l.   PSYCHIATRIC: The patient is alert and oriented x 3.  SKIN: No obvious rash, lesion, or ulcer.   LABORATORY PANEL:   CBC Recent Labs  Lab 05/22/18 0355  WBC 9.9  HGB 13.1  HCT 38.2  PLT 179   ------------------------------------------------------------------------------------------------------------------ Chemistries  Recent Labs  Lab 05/23/18 0407  NA 141  K 3.8  CL 106  CO2 29  GLUCOSE 114*  BUN 13  CREATININE 0.70  CALCIUM 8.4*  AST 279*  ALT 274*  ALKPHOS 80  BILITOT 0.6   ------------------------------------------------------------------------------------------------------------------  Cardiac Enzymes No results for input(s): TROPONINI in the last 168 hours. ------------------------------------------------------------------------------------------------------------------  RADIOLOGY:  Dg Ankle 2 Views Right  Result Date: 05/22/2018 CLINICAL DATA:  Right ankle fracture repair. FLUOROSCOPY TIME:  98 seconds. Images: 5 EXAM: RIGHT ANKLE - 2 VIEW; DG C-ARM 61-120 MIN COMPARISON:  None.  FINDINGS: Two screws extend through the medial malleolus. Plate and screws attached to the distal fibula. IMPRESSION: Ankle fracture repair as above. Electronically Signed   By: Dorise Bullion III M.D   On: 05/22/2018 15:05   Dg Ankle 2 Views Right  Result Date: 05/22/2018 CLINICAL DATA:  Fracture, postreduction. EXAM: RIGHT ANKLE - 2 VIEW COMPARISON:  Pre reduction radiographs earlier this day. FINDINGS: Overlying splint material in place limiting osseous and soft tissue fine detail. Improved alignment of distal tibia and fibular fractures postreduction with improved alignment of the tibial talar articulation. Slight residual lateral displacement of talus and distal fracture fragments. No new fracture. IMPRESSION: Improved alignment of medial and lateral malleolar fractures postreduction. Improved tibial talar alignment with mild residual lateral subluxation. Electronically Signed   By: Jeb Levering M.D.   On: 05/22/2018 02:45   Dg Ankle Complete Right  Result Date: 05/22/2018 CLINICAL DATA:  Post fall with right ankle pain, bruising, swelling and deformity. EXAM: RIGHT ANKLE - COMPLETE 3+ VIEW COMPARISON:  None. FINDINGS: Displaced ankle fracture subluxation. The talus is displaced laterally and posteriorly with respect to the tibial plafond, the distal tibia is perched on the talar dome. Transverse displaced fracture of the medial malleolus, distal fracture fragment remains aligned with the talus. Oblique displaced fracture of the distal fibula at the level of the ankle mortise, distal fracture fragment remains aligned with the talus. Diffuse soft tissue edema. IMPRESSION: Ankle fracture subluxation with displaced fractures of the medial and lateral malleoli. Talus and distal fracture fragment displaced laterally and posteriorly with respect to the tibial plafond. Electronically Signed   By: Jeb Levering M.D.   On: 05/22/2018 01:33   Ct Ankle Right Wo Contrast  Result Date:  05/22/2018 CLINICAL DATA:  Evaluate ankle fractures.  Preoperative assessment. EXAM: CT OF THE RIGHT ANKLE WITHOUT CONTRAST TECHNIQUE: Multidetector CT imaging of the right ankle was performed according to the standard protocol. Multiplanar CT image reconstructions were also generated. COMPARISON:  Radiographs 05/22/2018 FINDINGS: There is a displaced transverse fracture through the base of the medial malleolus with mild comminution. Avulsion fractures are noted at the tip of the mid medial malleolus also. Small oblique coursing intra-articular fracture of the posterior malleolus. Small fracture fragments are noted in the tibiotalar joint. There is approximately 1 cm of lateral subluxation of the talus in relation to the tibia. Largely transverse fracture involving the lateral malleolus at and above the level of the ankle mortise. Lateral and posterior displacement of approximately 1 cm is noted. The talus is intact. The subtalar joints are maintained. No midfoot or hindfoot fractures are identified. Moderate midfoot degenerative changes. Diffuse and fairly marked osteoporosis. IMPRESSION: 1. Trimalleolar ankle fractures with moderate displacement and comminution. 2. Small fracture fragments are noted in the tibiotalar joint and there is approximately 1 cm of lateral subluxation of the talus in relation to the tibia. 3. The talus and other midfoot and hindfoot bony structures are intact. 4. Advanced osteoporosis. Electronically Signed   By: Marijo Sanes M.D.   On: 05/22/2018 09:31   Dg C-arm 1-60 Min  Result Date: 05/22/2018 CLINICAL DATA:  Right ankle fracture repair. FLUOROSCOPY TIME:  98 seconds. Images: 5 EXAM: RIGHT ANKLE - 2 VIEW; DG C-ARM 61-120 MIN COMPARISON:  None. FINDINGS: Two screws extend through the medial malleolus. Plate and screws attached to the distal fibula.  IMPRESSION: Ankle fracture repair as above. Electronically Signed   By: Dorise Bullion III M.D   On: 05/22/2018 15:05      ASSESSMENT AND PLAN:   82 year old female patient with history of GERD, hyperlipidemia, skin cancer currently under hospitalist service for ankle fracture  -Right ankle fracture Trimalleolar ankle fracture with displacement S/p ORIF procedure Appreciate ortho f/u PT evaluation today NWB right loer extremity for six weeks Weight bearing as tolerated left leg  -GERD Continue proton pump inhibitor  -DVT prophylaxis on oral aspirin 325 mg daily  -Hypertension Continue losartan thiazide diuretic   All the records are reviewed and case discussed with Care Management/Social Worker. Management plans discussed with the patient, family and they are in agreement.  CODE STATUS: DNR  DVT Prophylaxis: SCDs  TOTAL TIME TAKING CARE OF THIS PATIENT: 35 minutes.   POSSIBLE D/C IN  1 to 2 DAYS, DEPENDING ON CLINICAL CONDITION.  Saundra Shelling M.D on 05/23/2018 at 11:50 AM  Between 7am to 6pm - Pager - [phone]  After 6pm go to [email or web address] - password [email or web address] [facility]  Office  [phone]  CC: Primary care physician; [name], MD  Note: This dictation was prepared with Dragon dictation along with smaller phrase technology. Any transcriptional errors that result from this process are unintentional.

## 2018-05-23 NOTE — Progress Notes (Signed)
PT Cancellation Note  Patient Details Name: Cindy Fitzpatrick MRN: [id number] DOB: 06/11/1936   Cancelled Treatment:    Reason Eval/Treat Not Completed: Pain limiting ability to participate PT attempted afternoon session again on this date however pt refuses due to pain. PT will attempt again on next date that pt is medically appropriate.  Yolonda Kida, SPT    Cindy Fitzpatrick 05/23/2018, 4:32 PM

## 2018-05-24 ENCOUNTER — Encounter: Payer: Self-pay | Admitting: Orthopedic Surgery

## 2018-05-24 ENCOUNTER — Other Ambulatory Visit: Payer: Self-pay | Admitting: Family Medicine

## 2018-05-24 LAB — COMPREHENSIVE METABOLIC PANEL
ALT: 126 U/L — ABNORMAL HIGH (ref 0–44)
AST: 101 U/L — ABNORMAL HIGH (ref 15–41)
Albumin: 3.7 g/dL (ref 3.5–5.0)
Alkaline Phosphatase: 89 U/L (ref 38–126)
Anion gap: 7 (ref 5–15)
BUN: 11 mg/dL (ref 8–23)
CO2: 28 mmol/L (ref 22–32)
Calcium: 8.9 mg/dL (ref 8.9–10.3)
Chloride: 103 mmol/L (ref 98–111)
Creatinine, Ser: 0.57 mg/dL (ref 0.44–1.00)
GFR calc Af Amer: >60 mL/min (ref >60)
GFR calc non Af Amer: >60 mL/min (ref >60)
Glucose, Bld: 123 mg/dL — ABNORMAL HIGH (ref 70–99)
Potassium: 3.5 mmol/L (ref 3.5–5.1)
Sodium: 138 mmol/L (ref 135–145)
Total Bilirubin: 0.9 mg/dL (ref 0.3–1.2)
Total Protein: 6.4 g/dL — ABNORMAL LOW (ref 6.5–8.1)

## 2018-05-24 MED ORDER — MAGNESIUM CITRATE PO SOLN
1.0000 | Freq: Once | ORAL | Status: AC
  Administered 2018-05-24: 1 via ORAL
  Filled 2018-05-24 (×2): qty 296

## 2018-05-24 MED ORDER — MAGNESIUM HYDROXIDE 400 MG/5ML PO SUSP
30.0000 mL | Freq: Every day | ORAL | Status: DC | PRN
  Filled 2018-05-24: qty 30

## 2018-05-24 NOTE — Progress Notes (Signed)
  Subjective: 2 Days Post-Op Procedure(s) (LRB): OPEN REDUCTION INTERNAL FIXATION (ORIF) TRIMALLEOLAR  ANKLE FRACTURE (Right) Patient reports pain as mild.   Patient seen in rounds with Dr. Posey Pronto. Patient is well, and has had no acute complaints or problems Plan is to go Skilled nursing facility after hospital stay. Negative for chest pain and shortness of breath Fever: no Gastrointestinal: Negative for nausea and vomiting  Objective: Vital signs in last 24 hours: Temp:  [97.6 F (36.4 C)-98.3 F (36.8 C)] 98 F (36.7 C) (07/31 0100) Pulse Rate:  [72-78] 72 (07/31 0100) Resp:  [18] 18 (07/31 0100) BP: (135-158)/(53-91) 150/54 (07/31 0100) SpO2:  [98 %-100 %] 98 % (07/31 0100)  Intake/Output from previous day:  Intake/Output Summary (Last 24 hours) at 05/24/2018 0711 Last data filed at 05/23/2018 1700 Gross per 24 hour  Intake 240 ml  Output -  Net 240 ml    Intake/Output this shift: No intake/output data recorded.  Labs: Recent Labs    05/22/18 0355  HGB 13.1   Recent Labs    05/22/18 0355  WBC 9.9  RBC 4.39  HCT 38.2  PLT 179   Recent Labs    05/23/18 0407 05/24/18 0350  NA 141 138  K 3.8 3.5  CL 106 103  CO2 29 28  BUN 13 11  CREATININE 0.70 0.57  GLUCOSE 114* 123*  CALCIUM 8.4* 8.9   Recent Labs    05/22/18 0355  INR 0.95     EXAM General - Patient is Alert and Oriented Extremity - Sensation intact distally Dorsiflexion/Plantar flexion intact Dressing/Incision - clean, dry, no drainage, with a splint intact Motor Function - intact, moving toes well on exam.   Past Medical History:  Diagnosis Date  . Arthritis   . Blood in stool   . Cancer ([place])    skin  . Chicken pox    SHINGLES TWICE  . Colon polyps   . Depression   . Diverticulitis   . GERD (gastroesophageal reflux disease)   . Hay fever   . Heart disease   . Heart murmur   . Hyperlipidemia   . UTI (urinary tract infection)   . Varicose veins of both lower extremities      Assessment/Plan: 2 Days Post-Op Procedure(s) (LRB): OPEN REDUCTION INTERNAL FIXATION (ORIF) TRIMALLEOLAR  ANKLE FRACTURE (Right) Active Problems:   Ankle fracture  Estimated body mass index is 27.46 kg/m as calculated from the following:   Height as of this encounter: 5\' 3"  (1.6 m).   Weight as of this encounter: 70.3 kg (155 lb). Advance diet Up with therapy D/C IV fluids Discharge to SNF when cleared by medicine.  Nonweightbearing for 6 weeks on right ankle. Aspirin 325 mg daily for 4 weeks.  Reche Dixon, PA-C Orthopaedic Surgery 05/24/2018, 7:11 AM

## 2018-05-24 NOTE — Progress Notes (Signed)
PASARR has been received, [id number] F expires on 06/23/18. Patient can D/C to [facility] when medically stable.   [employer], LCSW [phone]

## 2018-05-24 NOTE — Progress Notes (Signed)
Report called and given to [place] at [facility] [place]. Awaiting pt to have BM then EMS will be called for transport.

## 2018-05-24 NOTE — Discharge Summary (Addendum)
[place] at [place] [place] NAME: Cindy Fitzpatrick    MR#:  [id number]  DATE OF BIRTH:  01/10/1936  DATE OF ADMISSION:  05/22/2018 ADMITTING PHYSICIAN: Harrie Foreman, MD  DATE OF DISCHARGE: 05/24/2018  PRIMARY CARE PHYSICIAN: [name], MD   ADMISSION DIAGNOSIS:  Trimalleolar fracture, right, closed, initial encounter [S82.851A] Arthritis Hyperlipidemia GERD DISCHARGE DIAGNOSIS:  Active Problems:   Ankle fracture Arthritis Hyperlipidemia GERD  SECONDARY DIAGNOSIS:   Past Medical History:  Diagnosis Date  . Arthritis   . Blood in stool   . Cancer ([place])    skin  . Chicken pox    SHINGLES TWICE  . Colon polyps   . Depression   . Diverticulitis   . GERD (gastroesophageal reflux disease)   . Hay fever   . Heart disease   . Heart murmur   . Hyperlipidemia   . UTI (urinary tract infection)   . Varicose veins of both lower extremities      ADMITTING HISTORY The patient with past medical history of GERD, arthritis and treated skin cancer presents to the emergency department after a fall.  The patient reports rolling her ankle.  She had immediate swelling and bruising of her right ankle which was found to be broken on x-ray.  The joint was reduced by the emergency department staff prior to the hospitalist service given called for admission.  HOSPITAL COURSE:  Patient was admitted to medical floor.  Orthopedic surgery consultation was done.  Patient underwent surgery for ankle fracture.  She tolerated open reduction internal fixation of the trimalleolar fracture.  Patient received physical therapy.  During hospitalization she received DVT prophylaxis with subcu Lovenox daily.  Physical therapy evaluation was done and orthopedic surgery service recommended rehab placement.  Patient has a bed at [facility] facility and will be discharged on oral aspirin for 4 weeks as DVT prophylaxis.  CONSULTS OBTAINED:  Treatment Team:  Leim Fabry,  MD  DRUG ALLERGIES:   Allergies  Allergen Reactions  . Codeine Other (See Comments)    Other reaction(s): Headache  . Hydrocodone-Acetaminophen Nausea And Vomiting  . Meperidine     Other reaction(s): Dizziness  . Niacin Er Other (See Comments)    Patient doesn't remember  . Other Nausea Only  . Propoxyphene Other (See Comments)    Other reaction(s): Dizziness  . Solifenacin Other (See Comments)  . Sulfa Antibiotics Other (See Comments)    Patient doesn' t remember  . Fish-Derived Products Rash    Weakness  . Nabumetone Rash  . Oxycodone-Acetaminophen Rash    Couldn't think or remember anything    DISCHARGE MEDICATIONS:   Allergies as of 05/24/2018      Reactions   Codeine Other (See Comments)   Other reaction(s): Headache   Hydrocodone-acetaminophen Nausea And Vomiting   Meperidine    Other reaction(s): Dizziness   Niacin Er Other (See Comments)   Patient doesn't remember   Other Nausea Only   Propoxyphene Other (See Comments)   Other reaction(s): Dizziness   Solifenacin Other (See Comments)   Sulfa Antibiotics Other (See Comments)   Patient doesn' t remember   Fish-derived Products Rash   Weakness   Nabumetone Rash   Oxycodone-acetaminophen Rash   Couldn't think or remember anything      Medication List    TAKE these medications   acetaminophen 650 MG CR tablet Commonly known as:  TYLENOL Take 650 mg by mouth every 8 (eight) hours as needed for  pain. What changed:  Another medication with the same name was removed. Continue taking this medication, and follow the directions you see here.   alum & mag hydroxide-simeth 175-102-58 MG/5ML suspension Commonly known as:  MAALOX PLUS Take 30 mLs by mouth every 4 (four) hours as needed.   aspirin 325 MG EC tablet Take 1 tablet (325 mg total) by mouth daily.   bisacodyl 10 MG suppository Commonly known as:  DULCOLAX Place 10 mg rectally daily as needed.   buPROPion 150 MG 12 hr tablet Commonly known as:   WELLBUTRIN SR TAKE 1 TABLET BY MOUTH TWICE DAILY   Calcium Carb-Cholecalciferol 600-800 MG-UNIT Tabs Take 1 tablet by mouth daily.   calcium carbonate 500 MG chewable tablet Commonly known as:  TUMS - dosed in mg elemental calcium Chew 1-2 tablets by mouth every 4 (four) hours as needed for indigestion or heartburn. indigestion/ abd pain (1- mild; 2- moderate sx)   CENTRUM SILVER PO Take 1 tablet by mouth daily.   cloNIDine 0.1 MG tablet Commonly known as:  CATAPRES TAKE ONE TABLET BY MOUTH TWICE DAILY   diltiazem 30 MG tablet Commonly known as:  CARDIZEM TAKE 1 TABLET THREE TIMES DAILY   ferrous sulfate 325 (65 FE) MG tablet Take 325 mg by mouth at bedtime. iron supplement- dose/time changed d/t nausea   Fish Oil 1200 MG Caps Take 1 capsule by mouth daily.   losartan-hydrochlorothiazide 50-12.5 MG tablet Commonly known as:  HYZAAR TAKE 1 TABLET BY MOUTH DAILY   nitroGLYCERIN 0.4 mg/hr patch Commonly known as:  NITRODUR - Dosed in mg/24 hr APPLY 1 PATCH DAILY LEAVE ON 12 TO 14 HOURS AND REMOVE FOR 10-12 HOURS BEFORE APPLYING NEXT PATCH   oxyCODONE 5 MG immediate release tablet Commonly known as:  Oxy IR/ROXICODONE Take 1 tablet (5 mg total) by mouth every 4 (four) hours as needed for severe pain (pain score 7-10).   phenylephrine 0.25 % suppository Commonly known as:  (USE for PREPARATION-H) Place 1 suppository rectally every 4 (four) hours as needed for hemorrhoids. to relieve swelling/bleeding for hemorrhoid.   Polyethyl Glycol-Propyl Glycol 0.4-0.3 % Soln Apply 1 drop to eye as needed (dry eyes).   potassium chloride 10 MEQ tablet Commonly known as:  K-DUR TAKE 1 TABLET BY MOUTH DAILY   sertraline 100 MG tablet Commonly known as:  ZOLOFT TAKE 2 TABLETS BY MOUTH AT BEDTIME   traMADol 50 MG tablet Commonly known as:  ULTRAM Take 1 tablet (50 mg total) by mouth every 6 (six) hours as needed for moderate pain.   vitamin C 500 MG tablet Commonly known as:   ASCORBIC ACID Take 500 mg by mouth at bedtime.       Today  Patient seen and evaluated today  tolerating pain well  tolerating diet well  patient has a bed at rehab facility  VITAL SIGNS:  Blood pressure 106/78, pulse (!) 111, temperature 98 F (36.7 C), temperature source Oral, resp. rate 17, height 5\' 3"  (1.6 m), weight 70.3 kg (155 lb), SpO2 96 %.  I/O:    Intake/Output Summary (Last 24 hours) at 05/24/2018 1226 Last data filed at 05/24/2018 1020 Gross per 24 hour  Intake 480 ml  Output -  Net 480 ml    PHYSICAL EXAMINATION:  Physical Exam  GENERAL:  82 y.o.-year-old patient lying in the bed with no acute distress.  LUNGS: Normal breath sounds bilaterally, no wheezing, rales,rhonchi or crepitation. No use of accessory muscles of respiration.  CARDIOVASCULAR: S1, S2  normal. No murmurs, rubs, or gallops.  ABDOMEN: Soft, non-tender, non-distended. Bowel sounds present. No organomegaly or mass.  NEUROLOGIC: Moves all 4 extremities. PSYCHIATRIC: The patient is alert and oriented x 3.  SKIN: No obvious rash, lesion, or ulcer.   DATA REVIEW:   CBC Recent Labs  Lab 05/22/18 0355  WBC 9.9  HGB 13.1  HCT 38.2  PLT 179    Chemistries  Recent Labs  Lab 05/24/18 0350  NA 138  K 3.5  CL 103  CO2 28  GLUCOSE 123*  BUN 11  CREATININE 0.57  CALCIUM 8.9  AST 101*  ALT 126*  ALKPHOS 89  BILITOT 0.9    Cardiac Enzymes No results for input(s): TROPONINI in the last 168 hours.  Microbiology Results  Results for orders placed or performed during the hospital encounter of 05/22/18  MRSA PCR Screening     Status: None   Collection Time: 05/22/18  4:41 AM  Result Value Ref Range Status   MRSA by PCR NEGATIVE NEGATIVE Final    Comment:        The GeneXpert MRSA Assay (FDA approved for NASAL specimens only), is one component of a comprehensive MRSA colonization surveillance program. It is not intended to diagnose MRSA infection nor to guide or monitor  treatment for MRSA infections. Performed at [facility], [address] [place], [place] [address]     RADIOLOGY:  Dg Ankle 2 Views Right  Result Date: 05/22/2018 CLINICAL DATA:  Right ankle fracture repair. FLUOROSCOPY TIME:  98 seconds. Images: 5 EXAM: RIGHT ANKLE - 2 VIEW; DG C-ARM 61-120 MIN COMPARISON:  None. FINDINGS: Two screws extend through the medial malleolus. Plate and screws attached to the distal fibula. IMPRESSION: Ankle fracture repair as above. Electronically Signed   By: Dorise Bullion III M.D   On: 05/22/2018 15:05   Dg C-arm 1-60 Min  Result Date: 05/22/2018 CLINICAL DATA:  Right ankle fracture repair. FLUOROSCOPY TIME:  98 seconds. Images: 5 EXAM: RIGHT ANKLE - 2 VIEW; DG C-ARM 61-120 MIN COMPARISON:  None. FINDINGS: Two screws extend through the medial malleolus. Plate and screws attached to the distal fibula. IMPRESSION: Ankle fracture repair as above. Electronically Signed   By: Dorise Bullion III M.D   On: 05/22/2018 15:05    Follow up with PCP in 1 week.  Management plans discussed with the patient, family and they are in agreement.  CODE STATUS: DNR    Code Status Orders  (From admission, onward)        Start     Ordered   05/22/18 0420  Do not attempt resuscitation (DNR)  Continuous    Question Answer Comment  In the event of cardiac or respiratory ARREST Do not call a "code blue"   In the event of cardiac or respiratory ARREST Do not perform Intubation, CPR, defibrillation or ACLS   In the event of cardiac or respiratory ARREST Use medication by any route, position, wound care, and other measures to relive pain and suffering. May use oxygen, suction and manual treatment of airway obstruction as needed for comfort.      05/22/18 0420    Code Status History    Date Active Date Inactive Code Status Order ID Comments User Context   02/17/2018 1739 02/20/2018 1716 DNR [id number]  Farley Ly, RN Inpatient   02/17/2018 0430 02/17/2018 1739  Full Code [id number]  Arta Silence, MD ED   04/22/2016 1720 04/26/2016 1606 DNR [id number]  Demetrios Loll, MD Inpatient  Advance Directive Documentation     Most Recent Value  Type of Advance Directive  Healthcare Power of Attorney, Living will  Pre-existing out of facility DNR order (yellow form or pink MOST form)  -  "MOST" Form in Place?  -      TOTAL TIME TAKING CARE OF THIS PATIENT ON DAY OF DISCHARGE: more than 35 minutes.   Saundra Shelling M.D on 05/24/2018 at 12:26 PM  Between 7am to 6pm - Pager - [phone]  After 6pm go to [email or web address] - password [email or web address] [facility]  Office  [phone]  CC: Primary care physician; [name], MD  Note: This dictation was prepared with Dragon dictation along with smaller phrase technology. Any transcriptional errors that result from this process are unintentional.

## 2018-05-24 NOTE — Progress Notes (Signed)
Patient is medically stable for D/C to [facility] today. Per [facility] admissions coordinator at [facility] patient can come today to room 335 and [facility] SNF authorization has been received. RN will call report at [phone] and arrange EMS for transport. Clinical Education officer, museum (CSW) sent D/C orders to [employer] via [employer]. Patient is aware of above. CSW contacted patient's son [name] and made him aware of above. Please reconsult if future social work needs arise. CSW signing off.   [employer], LCSW [phone]

## 2018-05-24 NOTE — Progress Notes (Signed)
Pt unable to have BM. MD Pyreddy made aware and is ok with pt being discharged to [facility].

## 2018-05-24 NOTE — Clinical Social Work Placement (Signed)
   CLINICAL SOCIAL WORK PLACEMENT  NOTE  Date:  05/24/2018  Patient Details  Name: Cindy Fitzpatrick MRN: [id number] Date of Birth: December 17, 1935  Clinical Social Work is seeking post-discharge placement for this patient at the [place] level of care (*CSW will initial, date and re-position this form in  chart as items are completed):  Yes   Patient/family provided with [facility] Work Department's list of facilities offering this level of care within the geographic area requested by the patient (or if unable, by the patient's family).  Yes   Patient/family informed of their freedom to choose among providers that offer the needed level of care, that participate in Medicare, Medicaid or managed care program needed by the patient, have an available bed and are willing to accept the patient.  Yes   Patient/family informed of [facility]'s ownership interest in [facility] and [facility], as well as of the fact that they are under no obligation to receive care at these facilities.  PASRR submitted to EDS on 05/22/18     PASRR number received on 05/24/18     Existing PASRR number confirmed on       FL2 transmitted to all facilities in geographic area requested by pt/family on 05/22/18     FL2 transmitted to all facilities within larger geographic area on       Patient informed that his/her managed care company has contracts with or will negotiate with certain facilities, including the following:        Yes   Patient/family informed of bed offers received.  Patient chooses bed at [facility] )     Physician recommends and patient chooses bed at      Patient to be transferred to [facility] ) on 05/24/18.  Patient to be transferred to facility by [facility] )     Patient family notified on 05/24/18 of transfer.  Name of family member notified:  (Patient's son Cindy Fitzpatrick is aware of D/C today. )     PHYSICIAN       Additional  Comment:    _______________________________________________ Eliud Polo, Veronia Beets, LCSW 05/24/2018, 12:28 PM

## 2018-05-24 NOTE — Progress Notes (Signed)
Physical Therapy Treatment Patient Details Name: Cindy Fitzpatrick MRN: [id number] DOB: 18-Jul-1936 Today's Date: 05/24/2018    History of Present Illness Pt 82 y.o. female that presents to the hospital s/p ORIF for trimalleolar ankle fracture R side secondary to falling at home. Pt PMH includes HTN, CAD, hyperlipidemia, arthritis, GERD, and skin cancer.     PT Comments    Pt is progressing well towards and plan is d/c this afternoon. Pt was able to perform bed mobility with Min A at LE secondary to pain, transfers sit<>stand with Min A plus elevated seat for weakness, and ambulates 1' with Min A at gait belt for safety. Pt needs heavy verbal cueing to maintain non weight bearing restrictions with transfer/1' ambulation to recliner. Pt was less fatigued with transfer today and was able to keep R LE off ground with cueing. Pt still limited by strength, pain, fatigue, and limited knowledge of DME usage. D/C recommendations continue to be appropriate at this time.  Follow Up Recommendations  SNF     Equipment Recommendations       Recommendations for Other Services       Precautions / Restrictions Precautions Precautions: Fall Restrictions Weight Bearing Restrictions: Yes RLE Weight Bearing: Non weight bearing    Mobility  Bed Mobility Overal bed mobility: Needs Assistance Bed Mobility: Sit to Supine     Supine to sit: Min assist Sit to supine: Min assist   General bed mobility comments: Min A at R LE secondary to pain and weakness. with heavy verbal cueing to progress legs OOB.  Transfers Overall transfer level: Needs assistance Equipment used: Rolling walker (2 wheeled) Transfers: Sit to/from Stand Sit to Stand: Min assist         General transfer comment: Min A at gait belt for safety and weakness. Pt benefited from elevated surface to stand from.  Ambulation/Gait Ambulation/Gait assistance: Min assist Gait Distance (Feet): 1 Feet Assistive device: Rolling walker (2  wheeled)       General Gait Details: Pt benefits from verbal and tactile cueing to maintain weight bearing restrictions on R LE. Pt is not able to clear ground of L LE with B UE use on RW, thus 1' shuffle to recliner. Pt required Min A for safety, balance, weakness, and fatigue.   Stairs             Wheelchair Mobility    Modified Rankin (Stroke Patients Only)       Balance Overall balance assessment: Needs assistance Sitting-balance support: No upper extremity supported;Feet supported Sitting balance-Leahy Scale: Good     Standing balance support: Bilateral upper extremity supported Standing balance-Leahy Scale: Poor                              Cognition Arousal/Alertness: Awake/alert Behavior During Therapy: WFL for tasks assessed/performed Overall Cognitive Status: Within Functional Limits for tasks assessed                                 General Comments: Pt does change subject and has to be redirected to task ocasionally      Exercises Other Exercises Other Exercises: pt instructed in supine there.ex to include ankle pumps x10, quad sets x10, Min A hip abd/add x10, Min A heel slides x10, and Min A SLR x10. Pt was ale to perform there.ex with decrased pain and increased ROM comapered to  yesterday, but pt self limits and benefits from verbal/tactile cueing as well as encouragement.    General Comments        Pertinent Vitals/Pain Pain Assessment: Faces Faces Pain Scale: Hurts little more Pain Location: R ankle Pain Descriptors / Indicators: Grimacing;Guarding;Discomfort Pain Intervention(s): Limited activity within patient's tolerance;Monitored during session;Repositioned    Home Living                      Prior Function            PT Goals (current goals can now be found in the care plan section) Acute Rehab PT Goals Patient Stated Goal: get some rest PT Goal Formulation: With patient Time For Goal  Achievement: 06/06/18 Potential to Achieve Goals: Fair Progress towards PT goals: Progressing toward goals    Frequency    BID      PT Plan Current plan remains appropriate    Co-evaluation              AM-PAC PT "6 Clicks" Daily Activity  Outcome Measure  Difficulty turning over in bed (including adjusting bedclothes, sheets and blankets)?: Unable Difficulty moving from lying on back to sitting on the side of the bed? : Unable Difficulty sitting down on and standing up from a chair with arms (e.g., wheelchair, bedside commode, etc,.)?: Unable Help needed moving to and from a bed to chair (including a wheelchair)?: A Lot Help needed walking in hospital room?: A Little Help needed climbing 3-5 steps with a railing? : Total 6 Click Score: 9    End of Session Equipment Utilized During Treatment: Gait belt Activity Tolerance: Patient limited by fatigue;Patient limited by pain Patient left: in chair;with call bell/phone within reach;with chair alarm set Nurse Communication: Mobility status(and catheter status) PT Visit Diagnosis: Unsteadiness on feet (R26.81);Other abnormalities of gait and mobility (R26.89);Repeated falls (R29.6);History of falling (Z91.81);Muscle weakness (generalized) (M62.81);Difficulty in walking, not elsewhere classified (R26.2);Pain Pain - Right/Left: Right Pain - part of body: Ankle and joints of foot     Time: 1700-1749 PT Time Calculation (min) (ACUTE ONLY): 28 min  Charges:                        Rosario Adie, SPT    Rosario Adie 05/24/2018, 11:53 AM

## 2018-05-25 ENCOUNTER — Other Ambulatory Visit: Payer: Self-pay

## 2018-05-25 ENCOUNTER — Encounter
Admission: RE | Admit: 2018-05-25 | Discharge: 2018-05-25 | Disposition: A | Discharge status: Home / Self Care | Payer: Medicare Other | Source: Ambulatory Visit | Attending: Internal Medicine | Admitting: Internal Medicine

## 2018-05-25 MED ORDER — TRAMADOL HCL 50 MG PO TABS: 50.0000 mg | ORAL_TABLET | Freq: Four times a day (QID) | ORAL | 0 refills | Status: DC | PRN

## 2018-05-25 MED ORDER — OXYCODONE HCL 5 MG PO TABS: 5.0000 mg | ORAL_TABLET | ORAL | 0 refills | Status: DC | PRN

## 2018-05-25 NOTE — Telephone Encounter (Signed)
Rx sent to [facility] phone : [phone] , fax : [phone]  

## 2018-05-29 ENCOUNTER — Encounter: Payer: Self-pay | Admitting: Adult Health

## 2018-05-29 ENCOUNTER — Non-Acute Institutional Stay (SKILLED_NURSING_FACILITY): Payer: Medicare Other | Admitting: Adult Health

## 2018-05-29 DIAGNOSIS — F3175 Bipolar disorder, in partial remission, most recent episode depressed: Secondary | ICD-10-CM

## 2018-05-29 DIAGNOSIS — I2510 Atherosclerotic heart disease of native coronary artery without angina pectoris: Secondary | ICD-10-CM

## 2018-05-29 DIAGNOSIS — I10 Essential (primary) hypertension: Secondary | ICD-10-CM | POA: Diagnosis not present

## 2018-05-29 DIAGNOSIS — S82891S Other fracture of right lower leg, sequela: Secondary | ICD-10-CM | POA: Diagnosis not present

## 2018-05-29 DIAGNOSIS — D649 Anemia, unspecified: Secondary | ICD-10-CM

## 2018-05-29 NOTE — Progress Notes (Signed)
Location:   [facility] Room Number: 563O Place of Service:  SNF (31)   CODE STATUS: DNR  Allergies  Allergen Reactions  . Codeine Other (See Comments)    Other reaction(s): Headache  . Hydrocodone-Acetaminophen Nausea And Vomiting  . Meperidine     Other reaction(s): Dizziness  . Niacin Er Other (See Comments)    Patient doesn't remember  . Other Nausea Only  . Propoxyphene Other (See Comments)    Other reaction(s): Dizziness  . Solifenacin Other (See Comments)    Other reaction(s): Abdominal Pain  . Statins     Other reaction(s): Muscle Pain Weakness  . Sulfa Antibiotics Other (See Comments)    Patient doesn' t remember  . Fish-Derived Products Rash    Weakness  . Nabumetone Rash  . Oxycodone-Acetaminophen Rash    Couldn't think or remember anything    Chief Complaint  Patient presents with  . Acute Visit    Blood Pressure    HPI:  She has been having low blood pressure readings. She states that she feels lightheadedness; denies chest pain or sudden vision changes. She would like to have her blood pressure medication reduced.   Past Medical History:  Diagnosis Date  . Arthritis   . Blood in stool   . Cancer ([place])    skin  . Chicken pox    SHINGLES TWICE  . Colon polyps   . Depression   . Diverticulitis   . GERD (gastroesophageal reflux disease)   . Hay fever   . Heart disease   . Heart murmur   . Hyperlipidemia   . UTI (urinary tract infection)   . Varicose veins of both lower extremities     Past Surgical History:  Procedure Laterality Date  . HERNIA REPAIR    . KNEE SURGERY    . ORIF ANKLE FRACTURE Right 05/22/2018   Procedure: OPEN REDUCTION INTERNAL FIXATION (ORIF) TRIMALLEOLAR  ANKLE FRACTURE;  Surgeon: Leim Fabry, MD;  Location: [facility] ORS;  Service: Orthopedics;  Laterality: Right;    Social History   Socioeconomic History  . Marital status: Widowed    Spouse name: Not on file  . Number of children: Not on  file  . Years of education: Not on file  . Highest education level: Not on file  Occupational History  . Not on file  Social Needs  . Financial resource strain: Not on file  . Food insecurity:    Worry: Not on file    Inability: Not on file  . Transportation needs:    Medical: Not on file    Non-medical: Not on file  Tobacco Use  . Smoking status: Never Smoker  . Smokeless tobacco: Never Used  Substance and Sexual Activity  . Alcohol use: No    Alcohol/week: 0.0 oz  . Drug use: No  . Sexual activity: Never  Lifestyle  . Physical activity:    Days per week: Not on file    Minutes per session: Not on file  . Stress: Not on file  Relationships  . Social connections:    Talks on phone: Not on file    Gets together: Not on file    Attends religious service: Not on file    Active member of club or organization: Not on file    Attends meetings of clubs or organizations: Not on file    Relationship status: Not on file  . Intimate partner violence:    Fear of current or ex  partner: Not on file    Emotionally abused: Not on file    Physically abused: Not on file    Forced sexual activity: Not on file  Other Topics Concern  . Not on file  Social History Narrative  . Not on file   Family History  Problem Relation Age of Onset  . Stroke Mother   . Heart attack Father       VITAL SIGNS BP (!) 150/63   Pulse 80   Temp 97.8 F (36.6 C) (Oral)   Resp 16   Ht 5\' 3"  (1.6 m)   SpO2 97%   BMI 27.46 kg/m   Outpatient Encounter Medications as of 05/29/2018  Medication Sig  . acetaminophen (TYLENOL) 650 MG CR tablet Take 650 mg by mouth every 8 (eight) hours as needed for pain.  Marland Kitchen alum & mag hydroxide-simeth (MAALOX PLUS) 400-400-40 MG/5ML suspension Take 30 mLs by mouth every 4 (four) hours as needed.  . bisacodyl (DULCOLAX) 10 MG suppository Place 10 mg rectally daily as needed.  Marland Kitchen buPROPion (WELLBUTRIN SR) 150 MG 12 hr tablet TAKE 1 TABLET BY MOUTH TWICE DAILY  . Calcium  Carb-Cholecalciferol 600-800 MG-UNIT TABS Take 1 tablet by mouth daily.   . calcium carbonate (TUMS - DOSED IN MG ELEMENTAL CALCIUM) 500 MG chewable tablet Chew 1-2 tablets by mouth every 4 (four) hours as needed for indigestion or heartburn. indigestion/ abd pain (1- mild; 2- moderate sx)  . Cholecalciferol 4000 units CAPS Take 4,000 Units by mouth daily.  . cloNIDine (CATAPRES) 0.1 MG tablet TAKE ONE TABLET BY MOUTH TWICE DAILY  . diltiazem (CARDIZEM) 30 MG tablet TAKE 1 TABLET THREE TIMES DAILY  . ferrous sulfate 325 (65 FE) MG tablet Take 325 mg by mouth at bedtime. iron supplement- dose/time changed due to N/V, per patient request  . losartan-hydrochlorothiazide (HYZAAR) 50-12.5 MG tablet TAKE 1 TABLET BY MOUTH DAILY  . Multiple Vitamins-Minerals (CENTRUM SILVER) tablet Take 1 tablet by mouth daily.  . nitroGLYCERIN (NITRODUR - DOSED IN MG/24 HR) 0.4 mg/hr patch APPLY 1 PATCH DAILY LEAVE ON 12 TO 14 HOURS AND REMOVE FOR 10-12 HOURS BEFORE APPLYING NEXT PATCH  . Nutritional Supplements (NUTRITIONAL SUPPLEMENT PO) Diet Type: Regular  . Omega-3 Fatty Acids (FISH OIL) 1200 MG CAPS Take 1 capsule by mouth daily.   . phenylephrine (,USE FOR PREPARATION-H,) 0.25 % suppository Place 1 suppository rectally every 4 (four) hours as needed for hemorrhoids. to relieve swelling/bleeding for hemorrhoid.  Vladimir Faster Glycol-Propyl Glycol 0.4-0.3 % SOLN Place 1 drop into both eyes as needed (dry eyes).   . potassium chloride (K-DUR) 10 MEQ tablet TAKE 1 TABLET BY MOUTH DAILY  . sertraline (ZOLOFT) 100 MG tablet TAKE 2 TABLETS BY MOUTH AT BEDTIME  . traMADol (ULTRAM) 50 MG tablet Take 1 tablet (50 mg total) by mouth every 6 (six) hours as needed for moderate pain.  . vitamin C (ASCORBIC ACID) 500 MG tablet Take 500 mg by mouth at bedtime.  . [DISCONTINUED] aspirin EC 325 MG EC tablet Take 1 tablet (325 mg total) by mouth daily.  . [DISCONTINUED] Multiple Vitamins-Minerals (CENTRUM SILVER PO) Take 1 tablet by  mouth daily.  . [DISCONTINUED] oxyCODONE (OXY IR/ROXICODONE) 5 MG immediate release tablet Take 1 tablet (5 mg total) by mouth every 4 (four) hours as needed for severe pain (pain score 7-10).   No facility-administered encounter medications on file as of 05/29/2018.      SIGNIFICANT DIAGNOSTIC EXAMS  LABS REVIEWED: TODAY:   05-22-18:  wbc 9.9; hgb 13.1; hct 38.2; mcv 87.0; plt 179; glucose 119; bun 17; creat 0.84; k+ 4.0; na++ 139; ca 9.3; liver normal albumin 4.0; tsh 4.268 05-23-18: glucose 114; bun 13; creat 0.70; k+ 3.8; na++ 141; ca 8.4; ast 279; alt 274; albumin 3.5  05-24-18: glucose 123; bun 11; creat 0.57; k+ 3.5; na++ 138; ca 8.9; ast 101; alt 126; ca 8.9   Review of Systems  Constitutional: Negative for malaise/fatigue.  Respiratory: Negative for cough and shortness of breath.   Cardiovascular: Negative for chest pain, palpitations and leg swelling.  Gastrointestinal: Negative for abdominal pain, constipation and heartburn.  Musculoskeletal: Positive for joint pain. Negative for back pain and myalgias.       Right ankle pain is managed   Skin: Negative.   Neurological: Positive for dizziness.  Psychiatric/Behavioral: The patient is not nervous/anxious.     Physical Exam  Constitutional: She is oriented to person, place, and time. She appears well-developed and well-nourished. No distress.  Neck: No thyromegaly present.  Cardiovascular: Normal rate, regular rhythm, normal heart sounds and intact distal pulses.  Pulmonary/Chest: Effort normal and breath sounds normal. No respiratory distress.  Abdominal: Soft. Bowel sounds are normal. She exhibits no distension. There is no tenderness.  Musculoskeletal: She exhibits no edema.  Status post right ankle fracture in ace wrap cast Is able to move all extremities   Lymphadenopathy:    She has no cervical adenopathy.  Neurological: She is alert and oriented to person, place, and time.  Skin: Skin is warm and dry. She is not  diaphoretic.  Psychiatric: She has a normal mood and affect.      ASSESSMENT/ PLAN:  TODAY:   1. Benign essential hypertension: has been having low readings: will continue clonidine 0.1 mg twice daily will stop hyzaar and will begin HCTZ 12.5 mg will monitor   2. CAD is native artery: is stable will continue cardizem 30 mg three times daily ntg patch 0.4 mg daily   3. Right closed ankle fracture: does have RA: is stable will continue therapy as directed and will follow up with orthopedics; will continue ultram 50 mg every 6 hours as needed  4. Chronic anemia: is stable hgb 13.1; will continue iron daily   5. Hypokalemia: stable k+ 3.5; will continue k+ 10 meq daily   6. Bipolar affective disorder: is stable will continue wellbutrin SR 150 mg daily and zoloft 200 mg daily   Will repeat CMP    MD is aware of resident's narcotic use and is in agreement with current plan of care. We will attempt to wean resident as apropriate   Ok Edwards NP [facility]  Contact [phone] Monday through Friday 8am- 5pm  After hours call [phone]

## 2018-05-30 ENCOUNTER — Ambulatory Visit: Payer: Medicare Other | Admitting: Family Medicine

## 2018-06-01 ENCOUNTER — Non-Acute Institutional Stay (SKILLED_NURSING_FACILITY): Payer: Medicare Other | Admitting: Adult Health

## 2018-06-01 ENCOUNTER — Encounter: Payer: Self-pay | Admitting: Adult Health

## 2018-06-01 DIAGNOSIS — I2510 Atherosclerotic heart disease of native coronary artery without angina pectoris: Secondary | ICD-10-CM

## 2018-06-01 DIAGNOSIS — S82891S Other fracture of right lower leg, sequela: Secondary | ICD-10-CM | POA: Diagnosis not present

## 2018-06-01 DIAGNOSIS — M069 Rheumatoid arthritis, unspecified: Secondary | ICD-10-CM | POA: Diagnosis not present

## 2018-06-01 DIAGNOSIS — I10 Essential (primary) hypertension: Secondary | ICD-10-CM

## 2018-06-01 NOTE — Progress Notes (Signed)
Location:   [facility] Room Number: 440N Place of Service:  SNF (31)   CODE STATUS: DNR  Allergies  Allergen Reactions  . Codeine Other (See Comments)    Other reaction(s): Headache  . Hydrocodone-Acetaminophen Nausea And Vomiting  . Meperidine     Other reaction(s): Dizziness  . Niacin Er Other (See Comments)    Patient doesn't remember  . Other Nausea Only  . Propoxyphene Other (See Comments)    Other reaction(s): Dizziness  . Solifenacin Other (See Comments)    Other reaction(s): Abdominal Pain  . Statins     Other reaction(s): Muscle Pain Weakness  . Sulfa Antibiotics Other (See Comments)    Patient doesn' t remember  . Fish-Derived Products Rash    Weakness  . Nabumetone Rash  . Oxycodone-Acetaminophen Rash    Couldn't think or remember anything    Chief Complaint  Patient presents with  . Medical Management of Chronic Issues    Right ankle fracture; hypertension; cad. Weekly follow up for the first 30 days post hospitalization.     HPI:  She is a 82 year old rehab patient being seen for the management of her chronic illnesses: right ankle fracture; hypertension; cad. She denies any chest pain; no headaches; no changes in appetite. There are no nursing concerns at this time.   Past Medical History:  Diagnosis Date  . Arthritis   . Blood in stool   . Cancer ([place])    skin  . Chicken pox    SHINGLES TWICE  . Colon polyps   . Depression   . Diverticulitis   . GERD (gastroesophageal reflux disease)   . Hay fever   . Heart disease   . Heart murmur   . Hyperlipidemia   . UTI (urinary tract infection)   . Varicose veins of both lower extremities     Past Surgical History:  Procedure Laterality Date  . HERNIA REPAIR    . KNEE SURGERY    . ORIF ANKLE FRACTURE Right 05/22/2018   Procedure: OPEN REDUCTION INTERNAL FIXATION (ORIF) TRIMALLEOLAR  ANKLE FRACTURE;  Surgeon: Leim Fabry, MD;  Location: [facility] ORS;  Service:  Orthopedics;  Laterality: Right;    Social History   Socioeconomic History  . Marital status: Widowed    Spouse name: Not on file  . Number of children: Not on file  . Years of education: Not on file  . Highest education level: Not on file  Occupational History  . Not on file  Social Needs  . Financial resource strain: Not on file  . Food insecurity:    Worry: Not on file    Inability: Not on file  . Transportation needs:    Medical: Not on file    Non-medical: Not on file  Tobacco Use  . Smoking status: Never Smoker  . Smokeless tobacco: Never Used  Substance and Sexual Activity  . Alcohol use: No    Alcohol/week: 0.0 standard drinks  . Drug use: No  . Sexual activity: Never  Lifestyle  . Physical activity:    Days per week: Not on file    Minutes per session: Not on file  . Stress: Not on file  Relationships  . Social connections:    Talks on phone: Not on file    Gets together: Not on file    Attends religious service: Not on file    Active member of club or organization: Not on file    Attends meetings  of clubs or organizations: Not on file    Relationship status: Not on file  . Intimate partner violence:    Fear of current or ex partner: Not on file    Emotionally abused: Not on file    Physically abused: Not on file    Forced sexual activity: Not on file  Other Topics Concern  . Not on file  Social History Narrative  . Not on file   Family History  Problem Relation Age of Onset  . Stroke Mother   . Heart attack Father       VITAL SIGNS BP 119/61   Pulse 80   Temp 97.8 F (36.6 C) (Oral)   Resp 16   Ht 5\' 3"  (1.6 m)   Wt 155 lb (70.3 kg)   SpO2 97%   BMI 27.46 kg/m   Outpatient Encounter Medications as of 06/01/2018  Medication Sig  . acetaminophen (TYLENOL) 650 MG CR tablet Take 650 mg by mouth every 8 (eight) hours as needed for pain.  Marland Kitchen alum & mag hydroxide-simeth (MAALOX PLUS) 400-400-40 MG/5ML suspension Take 30 mLs by mouth every 4  (four) hours as needed for indigestion. Ok to use facility stock  . aspirin EC 81 MG tablet Take 81 mg by mouth daily.  . bisacodyl (DULCOLAX) 10 MG suppository Place 10 mg rectally daily as needed.  . bisacodyl (DULCOLAX) 5 MG EC tablet Take 5 mg by mouth 3 (three) times daily.  Marland Kitchen buPROPion (WELLBUTRIN SR) 150 MG 12 hr tablet TAKE 1 TABLET BY MOUTH TWICE DAILY  . Calcium Carb-Cholecalciferol 600-800 MG-UNIT TABS Take 1 tablet by mouth daily.   . calcium carbonate (TUMS - DOSED IN MG ELEMENTAL CALCIUM) 500 MG chewable tablet Chew 1-2 tablets by mouth every 4 (four) hours as needed for indigestion or heartburn. indigestion/ abd pain (1- mild; 2- moderate sx)  . Cholecalciferol 4000 units CAPS Take 4,000 Units by mouth daily.  . cloNIDine (CATAPRES) 0.1 MG tablet TAKE ONE TABLET BY MOUTH TWICE DAILY  . diltiazem (CARDIZEM) 30 MG tablet TAKE 1 TABLET THREE TIMES DAILY  . lactulose (CHRONULAC) 10 GM/15ML solution Take 20 g by mouth 3 (three) times daily. 30 ml  . losartan-hydrochlorothiazide (HYZAAR) 50-12.5 MG tablet TAKE 1 TABLET BY MOUTH DAILY  . Multiple Vitamins-Minerals (CENTRUM SILVER) tablet Take 1 tablet by mouth daily.  . nitroGLYCERIN (NITRODUR - DOSED IN MG/24 HR) 0.4 mg/hr patch APPLY 1 PATCH DAILY LEAVE ON 12 TO 14 HOURS AND REMOVE FOR 10-12 HOURS BEFORE APPLYING NEXT PATCH  . Nutritional Supplements (NUTRITIONAL SUPPLEMENT PO) Diet Type: Regular  . Omega-3 Fatty Acids (FISH OIL) 1200 MG CAPS Take 1 capsule by mouth daily.   Marland Kitchen omeprazole (PRILOSEC) 20 MG capsule Take 20 mg by mouth 2 (two) times daily.  . phenylephrine (,USE FOR PREPARATION-H,) 0.25 % suppository Place 1 suppository rectally every 4 (four) hours as needed for hemorrhoids. to relieve swelling/bleeding for hemorrhoid.  Vladimir Faster Glycol-Propyl Glycol 0.4-0.3 % SOLN Place 1 drop into both eyes as needed (dry eyes).   . potassium chloride (K-DUR) 10 MEQ tablet TAKE 1 TABLET BY MOUTH DAILY  . sertraline (ZOLOFT) 100 MG  tablet TAKE 2 TABLETS BY MOUTH AT BEDTIME  . traMADol (ULTRAM) 50 MG tablet Take 1 tablet (50 mg total) by mouth every 6 (six) hours as needed for moderate pain.  . vitamin C (ASCORBIC ACID) 500 MG tablet Take 500 mg by mouth at bedtime.  . [DISCONTINUED] ferrous sulfate 325 (65 FE) MG tablet  Take 325 mg by mouth at bedtime. iron supplement- dose/time changed due to N/V, per patient request   No facility-administered encounter medications on file as of 06/01/2018.      SIGNIFICANT DIAGNOSTIC EXAMS   LABS REVIEWED: TODAY:   05-22-18: wbc 9.9; hgb 13.1; hct 38.2; mcv 87.0; plt 179; glucose 119; bun 17; creat 0.84; k+ 4.0; na++ 139; ca 9.3; liver normal albumin 4.0; tsh 4.268 05-23-18: glucose 114; bun 13; creat 0.70; k+ 3.8; na++ 141; ca 8.4; ast 279; alt 274; albumin 3.5  05-24-18: glucose 123; bun 11; creat 0.57; k+ 3.5; na++ 138; ca 8.9; ast 101; alt 126; ca 8.9  NO NEW LABS.    Review of Systems  Constitutional: Negative for malaise/fatigue.  Respiratory: Negative for cough and shortness of breath.   Cardiovascular: Negative for chest pain, palpitations and leg swelling.  Gastrointestinal: Negative for abdominal pain, constipation and heartburn.  Musculoskeletal: Positive for joint pain. Negative for back pain and myalgias.       Right ankle pain managed   Skin: Negative.   Neurological: Negative for dizziness.  Psychiatric/Behavioral: The patient is not nervous/anxious.     Physical Exam  Constitutional: She is oriented to person, place, and time. She appears well-developed and well-nourished. No distress.  Neck: No thyromegaly present.  Cardiovascular: Normal rate, regular rhythm, normal heart sounds and intact distal pulses.  Pulmonary/Chest: Effort normal and breath sounds normal. No respiratory distress.  Abdominal: Soft. Bowel sounds are normal. She exhibits no distension. There is no tenderness.  Musculoskeletal: She exhibits no edema.  Status post right ankle fracture in  ace wrap cast Is able to move all extremities    Lymphadenopathy:    She has no cervical adenopathy.  Neurological: She is alert and oriented to person, place, and time.  Skin: Skin is warm and dry. She is not diaphoretic.  Psychiatric: She has a normal mood and affect.    ASSESSMENT/ PLAN:  TODAY:   1. Benign essential hypertension: stable b/p 119/61  will continue clonidine 0.1 mg twice daily and hyzaar 50/12.5 mg daily   2. CAD is native artery: is stable will continue cardizem 30 mg three times daily ntg patch 0.4 mg daily   3. Right closed ankle fracture: does have RA: is stable will continue therapy as directed and will follow up with orthopedics; will continue ultram 50 mg every 6 hours as needed  PREVIOUS   4. Chronic anemia: is stable hgb 13.1;  5. Hypokalemia: stable k+ 3.5; will continue k+ 10 meq daily   6. Bipolar affective disorder: is stable will continue wellbutrin SR 150 mg daily and zoloft 200 mg daily       MD is aware of resident's narcotic use and is in agreement with current plan of care. We will attempt to wean resident as apropriate   Ok Edwards NP [facility]  Contact [phone] Monday through Friday 8am- 5pm  After hours call [phone]

## 2018-06-09 ENCOUNTER — Ambulatory Visit: Payer: Medicare Other | Admitting: Family Medicine

## 2018-06-09 ENCOUNTER — Non-Acute Institutional Stay (SKILLED_NURSING_FACILITY): Payer: Medicare Other | Admitting: Adult Health

## 2018-06-09 ENCOUNTER — Encounter: Payer: Self-pay | Admitting: Adult Health

## 2018-06-09 DIAGNOSIS — D649 Anemia, unspecified: Secondary | ICD-10-CM | POA: Diagnosis not present

## 2018-06-09 DIAGNOSIS — E876 Hypokalemia: Secondary | ICD-10-CM

## 2018-06-09 DIAGNOSIS — S82891S Other fracture of right lower leg, sequela: Secondary | ICD-10-CM | POA: Diagnosis not present

## 2018-06-09 NOTE — Progress Notes (Signed)
Location:   [facility] Room Number: 841L Place of Service:  SNF (31)   CODE STATUS: DNR  Allergies  Allergen Reactions  . Codeine Other (See Comments)    Other reaction(s): Headache  . Hydrocodone-Acetaminophen Nausea And Vomiting  . Meperidine     Other reaction(s): Dizziness  . Niacin Er Other (See Comments)    Patient doesn't remember  . Other Nausea Only  . Propoxyphene Other (See Comments)    Other reaction(s): Dizziness  . Solifenacin Other (See Comments)    Other reaction(s): Abdominal Pain  . Statins     Other reaction(s): Muscle Pain Weakness  . Sulfa Antibiotics Other (See Comments)    Patient doesn' t remember  . Fish-Derived Products Rash    Weakness  . Nabumetone Rash  . Oxycodone-Acetaminophen Rash    Couldn't think or remember anything    Chief Complaint  Patient presents with  . Medical Management of Chronic Issues    Anemia; hypokalemia; right ankle fracture weekly follow for the first 30 days post hospitalization     HPI:  She is a 82 year old short term rehab patient being seen for the management of her chronic illnesses: anemia hypokalemia; right ankle fracture. She denies any changes in appetite; no uncontrolled pain; no anxiety. There are no nursing concerns at this time.    Past Medical History:  Diagnosis Date  . Arthritis   . Blood in stool   . Cancer ([place])    skin  . Chicken pox    SHINGLES TWICE  . Colon polyps   . Depression   . Diverticulitis   . GERD (gastroesophageal reflux disease)   . Hay fever   . Heart disease   . Heart murmur   . Hyperlipidemia   . UTI (urinary tract infection)   . Varicose veins of both lower extremities     Past Surgical History:  Procedure Laterality Date  . HERNIA REPAIR    . KNEE SURGERY    . ORIF ANKLE FRACTURE Right 05/22/2018   Procedure: OPEN REDUCTION INTERNAL FIXATION (ORIF) TRIMALLEOLAR  ANKLE FRACTURE;  Surgeon: Leim Fabry, MD;  Location: [facility] ORS;   Service: Orthopedics;  Laterality: Right;    Social History   Socioeconomic History  . Marital status: Widowed    Spouse name: Not on file  . Number of children: Not on file  . Years of education: Not on file  . Highest education level: Not on file  Occupational History  . Not on file  Social Needs  . Financial resource strain: Not on file  . Food insecurity:    Worry: Not on file    Inability: Not on file  . Transportation needs:    Medical: Not on file    Non-medical: Not on file  Tobacco Use  . Smoking status: Never Smoker  . Smokeless tobacco: Never Used  Substance and Sexual Activity  . Alcohol use: No    Alcohol/week: 0.0 standard drinks  . Drug use: No  . Sexual activity: Never  Lifestyle  . Physical activity:    Days per week: Not on file    Minutes per session: Not on file  . Stress: Not on file  Relationships  . Social connections:    Talks on phone: Not on file    Gets together: Not on file    Attends religious service: Not on file    Active member of club or organization: Not on file  Attends meetings of clubs or organizations: Not on file    Relationship status: Not on file  . Intimate partner violence:    Fear of current or ex partner: Not on file    Emotionally abused: Not on file    Physically abused: Not on file    Forced sexual activity: Not on file  Other Topics Concern  . Not on file  Social History Narrative  . Not on file   Family History  Problem Relation Age of Onset  . Stroke Mother   . Heart attack Father       VITAL SIGNS BP 111/66   Pulse 68   Temp 98.3 F (36.8 C) (Oral)   Resp 20   Ht 5\' 3"  (1.6 m)   Wt 145 lb 4.8 oz (65.9 kg)   SpO2 96%   BMI 25.74 kg/m   Outpatient Encounter Medications as of 06/09/2018  Medication Sig  . acetaminophen (TYLENOL) 650 MG CR tablet Take 650 mg by mouth every 8 (eight) hours as needed for pain.  Marland Kitchen alum & mag hydroxide-simeth (MAALOX PLUS) 400-400-40 MG/5ML suspension Take 30 mLs  by mouth every 4 (four) hours as needed for indigestion. Ok to use facility stock  . aspirin 325 MG EC tablet Take 325 mg by mouth daily.  . bisacodyl (DULCOLAX) 10 MG suppository Place 10 mg rectally daily as needed.  . bisacodyl (DULCOLAX) 5 MG EC tablet Take 5 mg by mouth 3 (three) times daily.  Marland Kitchen buPROPion (WELLBUTRIN SR) 150 MG 12 hr tablet TAKE 1 TABLET BY MOUTH TWICE DAILY  . Calcium Carb-Cholecalciferol 600-800 MG-UNIT TABS Take 1 tablet by mouth daily.   . calcium carbonate (TUMS - DOSED IN MG ELEMENTAL CALCIUM) 500 MG chewable tablet Chew 1-2 tablets by mouth every 4 (four) hours as needed for indigestion or heartburn. indigestion/ abd pain (1- mild; 2- moderate sx)  . Cholecalciferol 4000 units CAPS Take 4,000 Units by mouth daily.  . cloNIDine (CATAPRES) 0.1 MG tablet TAKE ONE TABLET BY MOUTH TWICE DAILY  . diltiazem (CARDIZEM) 30 MG tablet TAKE 1 TABLET THREE TIMES DAILY  . losartan-hydrochlorothiazide (HYZAAR) 50-12.5 MG tablet TAKE 1 TABLET BY MOUTH DAILY  . Multiple Vitamins-Minerals (CENTRUM SILVER) tablet Take 1 tablet by mouth daily.  . nitroGLYCERIN (NITRODUR - DOSED IN MG/24 HR) 0.4 mg/hr patch APPLY 1 PATCH DAILY LEAVE ON 12 TO 14 HOURS AND REMOVE FOR 10-12 HOURS BEFORE APPLYING NEXT PATCH  . Nutritional Supplements (NUTRITIONAL SUPPLEMENT PO) Diet Type: Regular  . Omega-3 Fatty Acids (FISH OIL) 1200 MG CAPS Take 1 capsule by mouth daily.   Marland Kitchen omeprazole (PRILOSEC) 20 MG capsule Take 20 mg by mouth 2 (two) times daily.  . phenylephrine (,USE FOR PREPARATION-H,) 0.25 % suppository Place 1 suppository rectally every 4 (four) hours as needed for hemorrhoids. to relieve swelling/bleeding for hemorrhoid.  Vladimir Faster Glycol-Propyl Glycol 0.4-0.3 % SOLN Place 1 drop into both eyes as needed (dry eyes).   . potassium chloride (K-DUR) 10 MEQ tablet TAKE 1 TABLET BY MOUTH DAILY  . sertraline (ZOLOFT) 100 MG tablet TAKE 2 TABLETS BY MOUTH AT BEDTIME  . traMADol (ULTRAM) 50 MG tablet  Take 1 tablet (50 mg total) by mouth every 6 (six) hours as needed for moderate pain.  . vitamin C (ASCORBIC ACID) 500 MG tablet Take 500 mg by mouth at bedtime.  . [DISCONTINUED] aspirin EC 81 MG tablet Take 81 mg by mouth daily.  . [DISCONTINUED] lactulose (CHRONULAC) 10 GM/15ML solution Take 20  g by mouth 3 (three) times daily. 30 ml   No facility-administered encounter medications on file as of 06/09/2018.      SIGNIFICANT DIAGNOSTIC EXAMS   LABS REVIEWED: TODAY:   05-22-18: wbc 9.9; hgb 13.1; hct 38.2; mcv 87.0; plt 179; glucose 119; bun 17; creat 0.84; k+ 4.0; na++ 139; ca 9.3; liver normal albumin 4.0; tsh 4.268 05-23-18: glucose 114; bun 13; creat 0.70; k+ 3.8; na++ 141; ca 8.4; ast 279; alt 274; albumin 3.5  05-24-18: glucose 123; bun 11; creat 0.57; k+ 3.5; na++ 138; ca 8.9; ast 101; alt 126; ca 8.9  NO NEW LABS.     Review of Systems  Constitutional: Negative for malaise/fatigue.  Respiratory: Negative for cough and shortness of breath.   Cardiovascular: Negative for chest pain, palpitations and leg swelling.  Gastrointestinal: Negative for abdominal pain, constipation and heartburn.  Musculoskeletal: Negative for back pain, joint pain and myalgias.  Skin: Negative.   Neurological: Negative for dizziness.  Psychiatric/Behavioral: The patient is not nervous/anxious.       Physical Exam  Constitutional: She is oriented to person, place, and time. She appears well-developed and well-nourished. No distress.  Neck: No thyromegaly present.  Cardiovascular: Normal rate, regular rhythm, normal heart sounds and intact distal pulses.  Pulmonary/Chest: Effort normal and breath sounds normal. No respiratory distress.  Abdominal: Soft. Bowel sounds are normal. She exhibits no distension. There is no tenderness.  Musculoskeletal: She exhibits no edema.  Is able to move all extremities Is status post right ankle fracture in ace cast   Lymphadenopathy:    She has no cervical  adenopathy.  Neurological: She is alert and oriented to person, place, and time.  Skin: Skin is warm and dry. She is not diaphoretic.  Psychiatric: She has a normal mood and affect.      ASSESSMENT/ PLAN:  TODAY:   1. Right closed ankle fracture: does have RA: is stable will continue therapy as directed and will follow up with orthopedics; will continue ultram 50 mg every 6 hours as needed  2. Chronic anemia: is stable hgb 13.1;  3. Hypokalemia: stable k+ 3.5; will continue k+ 10 meq daily   PREVIOUS   4. Bipolar affective disorder: is stable will continue wellbutrin SR 150 mg daily and zoloft 200 mg daily   5. Benign essential hypertension: stable b/p 119/61  will continue clonidine 0.1 mg twice daily and hyzaar 50/12.5 mg daily   6. CAD is native artery: is stable will continue cardizem 30 mg three times daily ntg patch 0.4 mg daily asa 325 mg daily    MD is aware of resident's narcotic use and is in agreement with current plan of care. We will attempt to wean resident as apropriate   Ok Edwards NP [facility]  Contact [phone] Monday through Friday 8am- 5pm  After hours call [phone]

## 2018-06-13 DIAGNOSIS — E876 Hypokalemia: Secondary | ICD-10-CM | POA: Insufficient documentation

## 2018-06-15 ENCOUNTER — Encounter: Payer: Self-pay | Admitting: Adult Health

## 2018-06-15 ENCOUNTER — Non-Acute Institutional Stay (SKILLED_NURSING_FACILITY): Payer: Medicare Other | Admitting: Adult Health

## 2018-06-15 DIAGNOSIS — K219 Gastro-esophageal reflux disease without esophagitis: Secondary | ICD-10-CM

## 2018-06-15 DIAGNOSIS — S82891S Other fracture of right lower leg, sequela: Secondary | ICD-10-CM | POA: Diagnosis not present

## 2018-06-15 DIAGNOSIS — I10 Essential (primary) hypertension: Secondary | ICD-10-CM

## 2018-06-15 DIAGNOSIS — K5909 Other constipation: Secondary | ICD-10-CM

## 2018-06-15 DIAGNOSIS — F339 Major depressive disorder, recurrent, unspecified: Secondary | ICD-10-CM | POA: Diagnosis not present

## 2018-06-15 NOTE — Progress Notes (Signed)
[facility] Room Number: [place] of Service:  SNF [phone]) Provider:  Durenda Age, NP  Patient Care Team: Cindy Haven, MD as PCP - General (Family Medicine)  Extended Emergency Contact Information Primary Emergency Contact: Cindy Fitzpatrick Address: [address]          [place], [place] [address] Cindy Fitzpatrick of [place] Phone: [phone] Mobile Phone: [phone] Relation: Son Secondary Emergency Contact: Cindy Fitzpatrick Address: [address]          [place], [place] [address] Cindy Fitzpatrick of [place] Mobile Phone: [phone] Relation: Son  Code Status:  DNR  Goals of care: Advanced Directive information Advanced Directives 06/15/2018  Does Patient Have a Medical Advance Directive? Yes  Type of Advance Directive Out of facility DNR (pink MOST or yellow form)  Does patient want to make changes to medical advance directive? No - Patient declined  Copy of [place] in Chart? -  Would patient like information on creating a medical advance directive? -     Chief Complaint  Patient presents with  . Medical Management of Chronic Issues    Weekly follow-up for the first 30 days post hospitalization    HPI:  Pt is a 82 y.o. female seen today for medical management of chronic diseases. She has PMH of GERD,arthritis and skin cancer.she was seen in her room today. She denies any pain at this time. Her BPs are - 124/62, 143/74, 106/85, 132/70.   He has been admitted to the [facility] n 05/24/18 from her recent hospitalization due to a fall from which she sustained  trimalleolar fracture or which she had ORIF on 05/22/18. She is currently having a short-term rehabilitation.    Past Medical History:  Diagnosis Date  . Arthritis   . Blood in stool   . Cancer ([place])    skin  . Chicken pox    SHINGLES TWICE  . Colon polyps   . Depression   . Diverticulitis   . GERD (gastroesophageal reflux disease)    . Hay fever   . Heart disease   . Heart murmur   . Hyperlipidemia   . UTI (urinary tract infection)   . Varicose veins of both lower extremities    Past Surgical History:  Procedure Laterality Date  . HERNIA REPAIR    . KNEE SURGERY    . ORIF ANKLE FRACTURE Right 05/22/2018   Procedure: OPEN REDUCTION INTERNAL FIXATION (ORIF) TRIMALLEOLAR  ANKLE FRACTURE;  Surgeon: Cindy Fabry, MD;  Location: [facility] ORS;  Service: Orthopedics;  Laterality: Right;    Allergies  Allergen Reactions  . Codeine Other (See Comments)    Other reaction(s): Headache  . Hydrocodone-Acetaminophen Nausea And Vomiting  . Meperidine     Other reaction(s): Dizziness  . Niacin Er Other (See Comments)    Patient doesn't remember  . Other Nausea Only  . Propoxyphene Other (See Comments)    Other reaction(s): Dizziness  . Solifenacin Other (See Comments)    Other reaction(s): Abdominal Pain  . Statins     Other reaction(s): Muscle Pain Weakness  . Sulfa Antibiotics Other (See Comments)    Patient doesn' t remember  . Fish-Derived Products Rash    Weakness  . Nabumetone Rash  . Oxycodone-Acetaminophen Rash    Couldn't think or remember anything    Outpatient Encounter Medications as of 06/15/2018  Medication Sig  . acetaminophen (TYLENOL) 650 MG CR tablet Take 650 mg by mouth every  8 (eight) hours as needed for pain.  Marland Kitchen alum & mag hydroxide-simeth (MAALOX PLUS) 400-400-40 MG/5ML suspension Take 30 mLs by mouth every 4 (four) hours as needed for indigestion. Ok to use facility stock  . aspirin 325 MG EC tablet Take 325 mg by mouth daily.  . bisacodyl (DULCOLAX) 10 MG suppository Place 10 mg rectally daily as needed.  . bisacodyl (DULCOLAX) 5 MG EC tablet Take 5 mg by mouth 3 (three) times daily.  Marland Kitchen buPROPion (WELLBUTRIN SR) 150 MG 12 hr tablet TAKE 1 TABLET BY MOUTH TWICE DAILY  . Calcium Carb-Cholecalciferol 600-800 MG-UNIT TABS Take 1 tablet by mouth daily.   . calcium carbonate (TUMS - DOSED IN MG  ELEMENTAL CALCIUM) 500 MG chewable tablet Chew 1-2 tablets by mouth every 4 (four) hours as needed for indigestion or heartburn. indigestion/ abd pain (1- mild; 2- moderate sx)  . Cholecalciferol 4000 units CAPS Take 4,000 Units by mouth daily.  . cloNIDine (CATAPRES) 0.1 MG tablet TAKE ONE TABLET BY MOUTH TWICE DAILY  . diltiazem (CARDIZEM) 30 MG tablet TAKE 1 TABLET THREE TIMES DAILY  . losartan-hydrochlorothiazide (HYZAAR) 50-12.5 MG tablet TAKE 1 TABLET BY MOUTH DAILY  . Multiple Vitamins-Minerals (CENTRUM SILVER) tablet Take 1 tablet by mouth daily.  . nitroGLYCERIN (NITRODUR - DOSED IN MG/24 HR) 0.4 mg/hr patch APPLY 1 PATCH DAILY LEAVE ON 12 TO 14 HOURS AND REMOVE FOR 10-12 HOURS BEFORE APPLYING NEXT PATCH  . Nutritional Supplements (NUTRITIONAL SUPPLEMENT PO) Diet Type: Regular  . Omega-3 Fatty Acids (FISH OIL) 1200 MG CAPS Take 1 capsule by mouth daily.   Marland Kitchen omeprazole (PRILOSEC) 20 MG capsule Take 20 mg by mouth 2 (two) times daily.  . phenylephrine (,USE FOR PREPARATION-H,) 0.25 % suppository Place 1 suppository rectally every 4 (four) hours as needed for hemorrhoids. to relieve swelling/bleeding for hemorrhoid.  Vladimir Faster Glycol-Propyl Glycol 0.4-0.3 % SOLN Place 1 drop into both eyes as needed (dry eyes).   . potassium chloride (K-DUR) 10 MEQ tablet TAKE 1 TABLET BY MOUTH DAILY  . sertraline (ZOLOFT) 100 MG tablet TAKE 2 TABLETS BY MOUTH AT BEDTIME  . sulfamethoxazole-trimethoprim (BACTRIM DS,SEPTRA DS) 800-160 MG tablet Take 1 tablet by mouth 2 (two) times daily. Aware of Allergy  . traMADol (ULTRAM) 50 MG tablet Take 1 tablet (50 mg total) by mouth every 6 (six) hours as needed for moderate pain.  . vitamin C (ASCORBIC ACID) 500 MG tablet Take 500 mg by mouth at bedtime.   No facility-administered encounter medications on file as of 06/15/2018.     Review of Systems  GENERAL: No change in appetite, no fatigue, no weight changes, no fever, chills or weakness MOUTH and THROAT:  Denies oral discomfort, gingival pain or bleeding, pain from teeth or hoarseness   RESPIRATORY: no cough, SOB, DOE, wheezing, hemoptysis CARDIAC: No chest pain, edema or palpitations GI: No abdominal pain, diarrhea, constipation, heart burn, nausea or vomiting GU: Denies dysuria, frequency, hematuria, incontinence, or discharge PSYCHIATRIC: Denies feelings of depression or anxiety. No report of hallucinations, insomnia, paranoia, or agitation   Immunization History  Administered Date(s) Administered  . Influenza, High Dose Seasonal PF 08/25/2016  . Influenza-Unspecified 07/13/2017  . Pneumococcal Conjugate-13 11/15/2017  . Pneumococcal Polysaccharide-23 03/25/2014, 07/25/2014  . Td 06/20/2015   Pertinent  Health Maintenance Due  Topic Date Due  . INFLUENZA VACCINE  05/25/2018  . DEXA SCAN  Completed   Fall Risk  01/31/2017 12/18/2015 11/27/2014  Falls in the past year? No Yes Yes  Number  falls in past yr: - 2 or more 2 or more  Injury with Fall? - Yes -  Risk Factor Category  - High Fall Risk -  Risk for fall due to : - History of fall(s);Impaired balance/gait;Impaired mobility Impaired balance/gait;History of fall(s)  Follow up - Follow up appointment -   Functional Status Survey:    Vitals:   06/15/18 1106  BP: 108/74  Pulse: 68  Resp: 20  Temp: 98.3 F (36.8 C)  TempSrc: Oral  SpO2: 96%  Weight: 145 lb 4.8 oz (65.9 kg)  Height: 5\' 3"  (1.6 m)   Body mass index is 25.74 kg/m.  Physical Exam  GENERAL APPEARANCE: Well nourished. In no acute distress. Normal body habitus SKIN:  Skin is warm and dry.  MOUTH and THROAT: Lips are without lesions. Oral mucosa is moist and without lesions. Tongue is normal in shape, size, and color and without lesions RESPIRATORY: Breathing is even & unlabored, BS CTAB CARDIAC: RRR, + murmur,no extra heart sounds, no edema GI: Abdomen soft, normal BS, no masses, no tenderness EXTREMITIES: Able to move 4 extremities, Right CAM boot, able to  wiggle toes on the right foot NEUROLOGICAL: There is no tremor. Speech is clear PSYCHIATRIC: Alert and oriented X 3. Affect and behavior are appropriate  Labs reviewed: Recent Labs    05/22/18 0355 05/23/18 0407 05/24/18 0350  NA 139 141 138  K 4.0 3.8 3.5  CL 104 106 103  CO2 27 29 28   GLUCOSE 119* 114* 123*  BUN 17 13 11   CREATININE 0.84 0.70 0.57  CALCIUM 9.3 8.4* 8.9   Recent Labs    05/22/18 0355 05/23/18 0407 05/24/18 0350  AST 35 279* 101*  ALT 18 274* 126*  ALKPHOS 58 80 89  BILITOT 0.8 0.6 0.9  PROT 6.4* 5.5* 6.4*  ALBUMIN 4.0 3.5 3.7   Recent Labs    02/17/18 0148 02/17/18 1423 02/18/18 0328 05/22/18 0355  WBC 9.8 8.2 7.7 9.9  NEUTROABS 6.3  --   --   --   HGB 11.9* 12.2 10.3* 13.1  HCT 34.6* 36.5 30.0* 38.2  MCV 89.5 89.8 89.9 87.0  PLT 202 196 145* 179   Lab Results  Component Value Date   TSH 4.268 05/22/2018   Lab Results  Component Value Date   HGBA1C 5.6 11/30/2017   Lab Results  Component Value Date   CHOL 325 (H) 11/27/2014   HDL 47.80 11/27/2014   LDLDIRECT 226.0 11/27/2014   TRIG 289.0 (H) 11/27/2014   CHOLHDL 7 11/27/2014    Significant Diagnostic Results in last 30 days:  Dg Ankle 2 Views Right  Result Date: 05/22/2018 CLINICAL DATA:  Right ankle fracture repair. FLUOROSCOPY TIME:  98 seconds. Images: 5 EXAM: RIGHT ANKLE - 2 VIEW; DG C-ARM 61-120 MIN COMPARISON:  None. FINDINGS: Two screws extend through the medial malleolus. Plate and screws attached to the distal fibula. IMPRESSION: Ankle fracture repair as above. Electronically Signed   By: Dorise Bullion III M.D   On: 05/22/2018 15:05   Dg Ankle 2 Views Right  Result Date: 05/22/2018 CLINICAL DATA:  Fracture, postreduction. EXAM: RIGHT ANKLE - 2 VIEW COMPARISON:  Pre reduction radiographs earlier this day. FINDINGS: Overlying splint material in place limiting osseous and soft tissue fine detail. Improved alignment of distal tibia and fibular fractures postreduction with  improved alignment of the tibial talar articulation. Slight residual lateral displacement of talus and distal fracture fragments. No new fracture. IMPRESSION: Improved alignment of medial and lateral  malleolar fractures postreduction. Improved tibial talar alignment with mild residual lateral subluxation. Electronically Signed   By: Jeb Levering M.D.   On: 05/22/2018 02:45   Dg Ankle Complete Right  Result Date: 05/22/2018 CLINICAL DATA:  Post fall with right ankle pain, bruising, swelling and deformity. EXAM: RIGHT ANKLE - COMPLETE 3+ VIEW COMPARISON:  None. FINDINGS: Displaced ankle fracture subluxation. The talus is displaced laterally and posteriorly with respect to the tibial plafond, the distal tibia is perched on the talar dome. Transverse displaced fracture of the medial malleolus, distal fracture fragment remains aligned with the talus. Oblique displaced fracture of the distal fibula at the level of the ankle mortise, distal fracture fragment remains aligned with the talus. Diffuse soft tissue edema. IMPRESSION: Ankle fracture subluxation with displaced fractures of the medial and lateral malleoli. Talus and distal fracture fragment displaced laterally and posteriorly with respect to the tibial plafond. Electronically Signed   By: Jeb Levering M.D.   On: 05/22/2018 01:33   Ct Ankle Right Wo Contrast  Result Date: 05/22/2018 CLINICAL DATA:  Evaluate ankle fractures.  Preoperative assessment. EXAM: CT OF THE RIGHT ANKLE WITHOUT CONTRAST TECHNIQUE: Multidetector CT imaging of the right ankle was performed according to the standard protocol. Multiplanar CT image reconstructions were also generated. COMPARISON:  Radiographs 05/22/2018 FINDINGS: There is a displaced transverse fracture through the base of the medial malleolus with mild comminution. Avulsion fractures are noted at the tip of the mid medial malleolus also. Small oblique coursing intra-articular fracture of the posterior malleolus.  Small fracture fragments are noted in the tibiotalar joint. There is approximately 1 cm of lateral subluxation of the talus in relation to the tibia. Largely transverse fracture involving the lateral malleolus at and above the level of the ankle mortise. Lateral and posterior displacement of approximately 1 cm is noted. The talus is intact. The subtalar joints are maintained. No midfoot or hindfoot fractures are identified. Moderate midfoot degenerative changes. Diffuse and fairly marked osteoporosis. IMPRESSION: 1. Trimalleolar ankle fractures with moderate displacement and comminution. 2. Small fracture fragments are noted in the tibiotalar joint and there is approximately 1 cm of lateral subluxation of the talus in relation to the tibia. 3. The talus and other midfoot and hindfoot bony structures are intact. 4. Advanced osteoporosis. Electronically Signed   By: Marijo Sanes M.D.   On: 05/22/2018 09:31   Dg C-arm 1-60 Min  Result Date: 05/22/2018 CLINICAL DATA:  Right ankle fracture repair. FLUOROSCOPY TIME:  98 seconds. Images: 5 EXAM: RIGHT ANKLE - 2 VIEW; DG C-ARM 61-120 MIN COMPARISON:  None. FINDINGS: Two screws extend through the medial malleolus. Plate and screws attached to the distal fibula. IMPRESSION: Ankle fracture repair as above. Electronically Signed   By: Dorise Bullion III M.D   On: 05/22/2018 15:05    Assessment/Plan  1. Closed fracture of right ankle, sequela - S/P ORIF on 05/22/18, continue Aspirin 325 mg 1 tab daily 07/08/18 for DVT prophylaxis,ollow-up with orthopedics, continue rehabilitation with PT and OT, for therapeutic strengthening exercises   2. Benign essential HTN - ell controlled, continue losartan-hydrochlorothiazide 50-12..5 mg  Tab daily, cardizem 30 mg1 tab 3 times a day and clonidine 0.1 mg 1 tab twice a day   3. Chronic constipation - verbalized that she's been having constipation since she was in high school, ill continue bisacodyl delayed release 5 mg 1 tab 3  times a day   4. Depression, recurrent ([place]) - she was conversant and happy,ontinue sertraline 100 mg 2  tabs daily at bedtime and Bupropion sustained release 12 hour 150 mg twice a day   5. Gastroesophageal reflux disease, esophagitis presence not specified - stable, continue omeprazole 20 mg 1 capsule twice a day    Family/ staff Communication: Discussed plan of care with patient.  Labs/tests ordered:  BMP  Goals of care:   Short-term care   Cindy Age, NP [facility] and Adult Medicine [phone] (Monday-Friday 8:00 a.m. - 5:00 p.m.) [phone] (after hours)

## 2018-06-16 ENCOUNTER — Other Ambulatory Visit
Admission: RE | Admit: 2018-06-16 | Discharge: 2018-06-16 | Disposition: A | Discharge status: Home / Self Care | Payer: Medicare Other | Source: Ambulatory Visit | Attending: Internal Medicine | Admitting: Internal Medicine

## 2018-06-16 DIAGNOSIS — I10 Essential (primary) hypertension: Secondary | ICD-10-CM | POA: Diagnosis present

## 2018-06-16 LAB — BASIC METABOLIC PANEL
ANION GAP: 9 (ref 5–15)
BUN: 13 mg/dL (ref 8–23)
CALCIUM: 9.2 mg/dL (ref 8.9–10.3)
CO2: 25 mmol/L (ref 22–32)
Chloride: 105 mmol/L (ref 98–111)
Creatinine, Ser: 0.83 mg/dL (ref 0.44–1.00)
GFR calc Af Amer: >60 mL/min (ref >60)
Glucose, Bld: 70 mg/dL (ref 70–99)
POTASSIUM: 3.6 mmol/L (ref 3.5–5.1)
SODIUM: 139 mmol/L (ref 135–145)

## 2018-06-19 ENCOUNTER — Other Ambulatory Visit
Admission: RE | Admit: 2018-06-19 | Discharge: 2018-06-19 | Disposition: A | Discharge status: Home / Self Care | Payer: Medicare Other | Source: Ambulatory Visit | Attending: Internal Medicine | Admitting: Internal Medicine

## 2018-06-19 DIAGNOSIS — E876 Hypokalemia: Secondary | ICD-10-CM | POA: Insufficient documentation

## 2018-06-19 LAB — BASIC METABOLIC PANEL
Anion gap: 9 (ref 5–15)
BUN: 19 mg/dL (ref 8–23)
CALCIUM: 9.6 mg/dL (ref 8.9–10.3)
CHLORIDE: 103 mmol/L (ref 98–111)
CO2: 24 mmol/L (ref 22–32)
CREATININE: 0.84 mg/dL (ref 0.44–1.00)
GFR calc Af Amer: >60 mL/min (ref >60)
GFR calc non Af Amer: >60 mL/min (ref >60)
GLUCOSE: 113 mg/dL — AB (ref 70–99)
Potassium: 3.9 mmol/L (ref 3.5–5.1)
Sodium: 136 mmol/L (ref 135–145)

## 2018-06-20 ENCOUNTER — Other Ambulatory Visit: Payer: Self-pay | Admitting: Family Medicine

## 2018-06-25 ENCOUNTER — Encounter
Admission: RE | Admit: 2018-06-25 | Discharge: 2018-06-25 | Disposition: A | Discharge status: Home / Self Care | Payer: Medicare Other | Source: Ambulatory Visit | Attending: Internal Medicine | Admitting: Internal Medicine

## 2018-06-28 ENCOUNTER — Other Ambulatory Visit
Admission: RE | Admit: 2018-06-28 | Discharge: 2018-06-28 | Disposition: A | Discharge status: Home / Self Care | Payer: Medicare Other | Source: Ambulatory Visit | Attending: Internal Medicine | Admitting: Internal Medicine

## 2018-06-28 DIAGNOSIS — M6281 Muscle weakness (generalized): Secondary | ICD-10-CM | POA: Diagnosis present

## 2018-06-28 LAB — CBC WITH DIFFERENTIAL/PLATELET
Basophils Absolute: 0.1 10*3/uL (ref 0–0.1)
Basophils Relative: 1 %
Eosinophils Absolute: 0.6 10*3/uL (ref 0–0.7)
Eosinophils Relative: 7 %
HCT: 38.1 % (ref 35.0–47.0)
Hemoglobin: 13.1 g/dL (ref 12.0–16.0)
LYMPHS ABS: 1.8 10*3/uL (ref 1.0–3.6)
LYMPHS PCT: 20 %
MCH: 30.4 pg (ref 26.0–34.0)
MCHC: 34.3 g/dL (ref 32.0–36.0)
MCV: 88.6 fL (ref 80.0–100.0)
MONOS PCT: 8 %
Monocytes Absolute: 0.7 10*3/uL (ref 0.2–0.9)
Neutro Abs: 5.7 10*3/uL (ref 1.4–6.5)
Neutrophils Relative %: 64 %
Platelets: 210 10*3/uL (ref 150–440)
RBC: 4.3 MIL/uL (ref 3.80–5.20)
RDW: 14.7 % — ABNORMAL HIGH (ref 11.5–14.5)
WBC: 8.9 10*3/uL (ref 3.6–11.0)

## 2018-06-28 LAB — BASIC METABOLIC PANEL
Anion gap: 10 (ref 5–15)
BUN: 22 mg/dL (ref 8–23)
CO2: 24 mmol/L (ref 22–32)
CREATININE: 0.79 mg/dL (ref 0.44–1.00)
Calcium: 9.7 mg/dL (ref 8.9–10.3)
Chloride: 103 mmol/L (ref 98–111)
GFR calc Af Amer: >60 mL/min (ref >60)
GFR calc non Af Amer: >60 mL/min (ref >60)
GLUCOSE: 98 mg/dL (ref 70–99)
Potassium: 4.3 mmol/L (ref 3.5–5.1)
Sodium: 137 mmol/L (ref 135–145)

## 2018-06-28 LAB — TSH: TSH: 3.381 u[IU]/mL (ref 0.350–4.500)

## 2018-06-29 LAB — URINE CULTURE: Culture: <10000 — AB

## 2018-07-03 ENCOUNTER — Non-Acute Institutional Stay (SKILLED_NURSING_FACILITY): Payer: Medicare Other | Admitting: Adult Health

## 2018-07-03 ENCOUNTER — Encounter: Payer: Self-pay | Admitting: Adult Health

## 2018-07-03 DIAGNOSIS — F3175 Bipolar disorder, in partial remission, most recent episode depressed: Secondary | ICD-10-CM

## 2018-07-03 DIAGNOSIS — K219 Gastro-esophageal reflux disease without esophagitis: Secondary | ICD-10-CM | POA: Diagnosis not present

## 2018-07-03 DIAGNOSIS — I2510 Atherosclerotic heart disease of native coronary artery without angina pectoris: Secondary | ICD-10-CM | POA: Diagnosis not present

## 2018-07-03 DIAGNOSIS — I10 Essential (primary) hypertension: Secondary | ICD-10-CM

## 2018-07-03 NOTE — Progress Notes (Signed)
Location:   [facility] Room Number: 277O Place of Service:  SNF (31)   CODE STATUS: DNR  Allergies  Allergen Reactions  . Codeine Other (See Comments)    Other reaction(s): Headache  . Hydrocodone-Acetaminophen Nausea And Vomiting  . Meperidine     Other reaction(s): Dizziness  . Niacin Er Other (See Comments)    Patient doesn't remember  . Other Nausea Only  . Propoxyphene Other (See Comments)    Other reaction(s): Dizziness  . Solifenacin Other (See Comments)    Other reaction(s): Abdominal Pain  . Statins     Other reaction(s): Muscle Pain Weakness  . Sulfa Antibiotics Other (See Comments)    Patient doesn' t remember  . Fish-Derived Products Rash    Weakness  . Nabumetone Rash  . Oxycodone-Acetaminophen Rash    Couldn't think or remember anything    Chief Complaint  Patient presents with  . Medical Management of Chronic Issues    Hypertension; cad; gerd; bipolar    HPI:  She is a 82 year old long term resident of this facility being seen for the management of her chronic illnesses: hypertension; cad; gerd; bipolar disorder. She denies any heart burn; no chest pain; no anxiety or depressive feelings. There are no nursing concerns at this time  Past Medical History:  Diagnosis Date  . Arthritis   . Blood in stool   . Cancer ([place])    skin  . Chicken pox    SHINGLES TWICE  . Colon polyps   . Depression   . Diverticulitis   . GERD (gastroesophageal reflux disease)   . Hay fever   . Heart disease   . Heart murmur   . Hyperlipidemia   . UTI (urinary tract infection)   . Varicose veins of both lower extremities     Past Surgical History:  Procedure Laterality Date  . HERNIA REPAIR    . KNEE SURGERY    . ORIF ANKLE FRACTURE Right 05/22/2018   Procedure: OPEN REDUCTION INTERNAL FIXATION (ORIF) TRIMALLEOLAR  ANKLE FRACTURE;  Surgeon: Leim Fabry, MD;  Location: [facility] ORS;  Service: Orthopedics;  Laterality: Right;    Social  History   Socioeconomic History  . Marital status: Widowed    Spouse name: Not on file  . Number of children: Not on file  . Years of education: Not on file  . Highest education level: Not on file  Occupational History  . Not on file  Social Needs  . Financial resource strain: Not on file  . Food insecurity:    Worry: Not on file    Inability: Not on file  . Transportation needs:    Medical: Not on file    Non-medical: Not on file  Tobacco Use  . Smoking status: Never Smoker  . Smokeless tobacco: Never Used  Substance and Sexual Activity  . Alcohol use: No    Alcohol/week: 0.0 standard drinks  . Drug use: No  . Sexual activity: Never  Lifestyle  . Physical activity:    Days per week: Not on file    Minutes per session: Not on file  . Stress: Not on file  Relationships  . Social connections:    Talks on phone: Not on file    Gets together: Not on file    Attends religious service: Not on file    Active member of club or organization: Not on file    Attends meetings of clubs or organizations: Not on file  Relationship status: Not on file  . Intimate partner violence:    Fear of current or ex partner: Not on file    Emotionally abused: Not on file    Physically abused: Not on file    Forced sexual activity: Not on file  Other Topics Concern  . Not on file  Social History Narrative  . Not on file   Family History  Problem Relation Age of Onset  . Stroke Mother   . Heart attack Father       VITAL SIGNS BP 140/60   Pulse 81   Temp 98.4 F (36.9 C) (Oral)   Resp 20   Ht 5\' 3"  (1.6 m)   Wt 139 lb (63 kg)   SpO2 94%   BMI 24.62 kg/m   Outpatient Encounter Medications as of 07/03/2018  Medication Sig  . acetaminophen (TYLENOL) 650 MG CR tablet Take 650 mg by mouth every 8 (eight) hours as needed for pain.  Marland Kitchen alum & mag hydroxide-simeth (MAALOX PLUS) 400-400-40 MG/5ML suspension Take 30 mLs by mouth every 4 (four) hours as needed for indigestion. Ok to use  facility stock  . aspirin 325 MG EC tablet Take 325 mg by mouth daily.  . bisacodyl (DULCOLAX) 10 MG suppository Place 10 mg rectally daily as needed.  . bisacodyl (DULCOLAX) 5 MG EC tablet Take 5 mg by mouth 3 (three) times daily.  Marland Kitchen buPROPion (WELLBUTRIN SR) 150 MG 12 hr tablet TAKE 1 TABLET BY MOUTH TWICE DAILY  . Calcium Carb-Cholecalciferol 600-800 MG-UNIT TABS Take 1 tablet by mouth daily.   . calcium carbonate (TUMS - DOSED IN MG ELEMENTAL CALCIUM) 500 MG chewable tablet Chew 1-2 tablets by mouth every 4 (four) hours as needed for indigestion or heartburn. indigestion/ abd pain (1- mild; 2- moderate sx)  . Cholecalciferol 4000 units CAPS Take 4,000 Units by mouth daily.  . cloNIDine (CATAPRES) 0.1 MG tablet TAKE ONE TABLET BY MOUTH TWICE DAILY  . diltiazem (CARDIZEM) 30 MG tablet TAKE 1 TABLET THREE TIMES DAILY  . losartan-hydrochlorothiazide (HYZAAR) 50-12.5 MG tablet TAKE 1 TABLET BY MOUTH DAILY  . Multiple Vitamins-Minerals (CENTRUM SILVER) tablet Take 1 tablet by mouth daily.  . nitroGLYCERIN (NITRODUR - DOSED IN MG/24 HR) 0.4 mg/hr patch APPLY 1 PATCH DAILY LEAVE ON 12 TO 14 HOURS AND REMOVE FOR 10-12 HOURS BEFORE APPLYING NEXT PATCH  . Nutritional Supplements (NUTRITIONAL SUPPLEMENT PO) Diet Type: Regular  . Omega-3 Fatty Acids (FISH OIL) 1200 MG CAPS Take 1 capsule by mouth daily.   Marland Kitchen omeprazole (PRILOSEC) 20 MG capsule Take 20 mg by mouth 2 (two) times daily.  . phenylephrine (,USE FOR PREPARATION-H,) 0.25 % suppository Place 1 suppository rectally every 4 (four) hours as needed for hemorrhoids. to relieve swelling/bleeding for hemorrhoid.  Vladimir Faster Glycol-Propyl Glycol 0.4-0.3 % SOLN Place 1 drop into both eyes as needed (dry eyes).   . potassium chloride (K-DUR) 10 MEQ tablet TAKE 1 TABLET BY MOUTH DAILY  . sertraline (ZOLOFT) 100 MG tablet TAKE TWO TABLETS BY MOUTH AT BEDTIME  . traMADol (ULTRAM) 50 MG tablet Take 1 tablet (50 mg total) by mouth every 6 (six) hours as needed  for moderate pain.  . vitamin C (ASCORBIC ACID) 500 MG tablet Take 500 mg by mouth at bedtime.   No facility-administered encounter medications on file as of 07/03/2018.      SIGNIFICANT DIAGNOSTIC EXAMS  LABS REVIEWED: TODAY:   05-22-18: wbc 9.9; hgb 13.1; hct 38.2; mcv 87.0; plt 179; glucose 119;  bun 17; creat 0.84; k+ 4.0; na++ 139; ca 9.3; liver normal albumin 4.0; tsh 4.268 05-23-18: glucose 114; bun 13; creat 0.70; k+ 3.8; na++ 141; ca 8.4; ast 279; alt 274; albumin 3.5  05-24-18: glucose 123; bun 11; creat 0.57; k+ 3.5; na++ 138; ca 8.9; ast 101; alt 126; ca 8.9  TODAY:   06-16-18: glucose 70; bun 13; creat 0.83; k+ 3.6; na++ 139; ca 8.2 06-19-18: glucose 113; bun 19; creat 0.84; k+ 3.9; na++ 136; ca 9.6 06-28-18: wbc 8.9; hgb 13.1; hct 38.1; mcv 88.6; plt 210; glucose 98; bun 22; creat 0.79; k+ 4.3; na++ 137; ca 9.7; tsh 3.381; urine culture: <10,000 colonies    Review of Systems  Constitutional: Negative for malaise/fatigue.  Respiratory: Negative for cough and shortness of breath.   Cardiovascular: Negative for chest pain, palpitations and leg swelling.  Gastrointestinal: Negative for abdominal pain, constipation and heartburn.  Musculoskeletal: Negative for back pain, joint pain and myalgias.  Skin: Negative.   Neurological: Negative for dizziness.  Psychiatric/Behavioral: The patient is not nervous/anxious.    Physical Exam  Constitutional: She is oriented to person, place, and time. She appears well-developed and well-nourished. No distress.  Neck: No thyromegaly present.  Cardiovascular: Normal rate, regular rhythm, normal heart sounds and intact distal pulses.  Pulmonary/Chest: Effort normal and breath sounds normal. No respiratory distress.  Abdominal: Soft. Bowel sounds are normal. She exhibits no distension. There is no tenderness.  Musculoskeletal: She exhibits no edema.  Is able to move all extremities  Status post right ankle fracture   Lymphadenopathy:    She  has no cervical adenopathy.  Neurological: She is alert and oriented to person, place, and time.  Skin: Skin is warm and dry. She is not diaphoretic.  Psychiatric: She has a normal mood and affect.    ASSESSMENT/ PLAN:  TODAY:   1. Bipolar affective disorder: is stable will continue wellbutrin SR 150 mg twice daily and zoloft 200 mg daily   2. Benign essential hypertension: stable b/p 140/60  will continue clonidine 0.1 mg twice daily and hyzaar 50/12.5 mg daily  cardizem 30 mg three times daily   3. CAD is native artery: is stable will continue cardizem 30 mg three times daily ntg patch 0.4 mg daily asa 325 mg daily   4. gerd without esophagitis: is stable will continue prilosec 20 mg twice daily    PREVIOUS   5. Chronic constipation: is stable will continue dulcolax tab 5 mg three times daily   6. Right closed ankle fracture: does have RA: is stable will continue therapy as directed and will follow up with orthopedics; will continue ultram 50 mg every 6 hours as needed  7. Chronic anemia: is stable hgb 13.1;  8. Hypokalemia: stable k+ 4.3; will continue k+ 10 meq daily    MD is aware of resident's narcotic use and is in agreement with current plan of care. We will attempt to wean resident as apropriate   Ok Edwards NP [facility]  Contact [phone] Monday through Friday 8am- 5pm  After hours call [phone]

## 2018-07-20 ENCOUNTER — Other Ambulatory Visit: Payer: Self-pay | Admitting: Family Medicine

## 2018-07-21 NOTE — Telephone Encounter (Signed)
30 day supply sent pt will need an OV for more refills thanks

## 2018-07-25 ENCOUNTER — Encounter
Admission: RE | Admit: 2018-07-25 | Discharge: 2018-07-25 | Disposition: A | Discharge status: Home / Self Care | Payer: Medicare Other | Source: Ambulatory Visit | Attending: Internal Medicine | Admitting: Internal Medicine

## 2018-08-02 ENCOUNTER — Encounter: Payer: Self-pay | Admitting: Adult Health

## 2018-08-02 NOTE — Progress Notes (Signed)
Location:  [facility] Room Number: 335-P Place of Service:  SNF (31) Provider:  Durenda Age, NP  Patient Care Team: [name], MD as PCP - General (Family Medicine)  Extended Emergency Contact Information Primary Emergency Contact: [name] Address: [address]          [place], [place] [address] [name] of [place] Phone: [phone] Work Phone: [phone] Mobile Phone: [phone] Relation: Son Secondary Emergency Contact: [name] Address: [address]          [place],  [address] [name] of [place] Phone: [phone] Work Phone: [phone] Mobile Phone: [phone] Relation: Son  Code Status:  DNR  Goals of care: Advanced Directive information Advanced Directives 07/03/2018  Does Patient Have a Medical Advance Directive? Yes  Type of Advance Directive Out of facility DNR (pink MOST or yellow form)  Does patient want to make changes to medical advance directive? No - Patient declined  Copy of [place] in Chart? -  Would patient like information on creating a medical advance directive? -     Chief Complaint  Patient presents with  . Medical Management of Chronic Issues    Routine [facility] visit    HPI:  Pt is an 82 y.o. female seen today for medical management of chronic diseases.  She is a long-term care resident of [facility]  She has a PMH of atherosclerotic heart disease, GERD, HLD, bipolar disorder, PTSD, panic disorder, essential hypertension, and rheumatoid arthritis.     Past Medical History:  Diagnosis Date  . Arthritis   . Blood in stool   . Cancer ([place])    skin  . Chicken pox    SHINGLES TWICE  . Colon polyps   . Depression   . Diverticulitis   . GERD (gastroesophageal reflux disease)   . Hay fever   . Heart disease   . Heart murmur   . Hyperlipidemia   . UTI (urinary tract infection)   . Varicose veins of both lower  extremities    Past Surgical History:  Procedure Laterality Date  . HERNIA REPAIR    . KNEE SURGERY    . ORIF ANKLE FRACTURE Right 05/22/2018   Procedure: OPEN REDUCTION INTERNAL FIXATION (ORIF) TRIMALLEOLAR  ANKLE FRACTURE;  Surgeon: Leim Fabry, MD;  Location: [facility] ORS;  Service: Orthopedics;  Laterality: Right;    Allergies  Allergen Reactions  . Codeine Other (See Comments)    Other reaction(s): Headache  . Hydrocodone-Acetaminophen Nausea And Vomiting  . Meperidine     Other reaction(s): Dizziness  . Niacin Er Other (See Comments)    Patient doesn't remember  . Other Nausea Only  . Propoxyphene Other (See Comments)    Other reaction(s): Dizziness  . Solifenacin Other (See Comments)    Other reaction(s): Abdominal Pain  . Statins     Other reaction(s): Muscle Pain Weakness  . Sulfa Antibiotics Other (See Comments)    Patient doesn' t remember  . Fish-Derived Products Rash    Weakness  . Nabumetone Rash  . Oxycodone-Acetaminophen Rash    Couldn't think or remember anything    Outpatient Encounter Medications as of 08/02/2018  Medication Sig  . acetaminophen (TYLENOL) 650 MG CR tablet Take 650 mg by mouth every 8 (eight) hours as needed for pain.  Marland Kitchen alum & mag hydroxide-simeth (MAALOX PLUS) 400-400-40 MG/5ML suspension Take 30 mLs by mouth every 4 (four) hours as needed for indigestion. Ok to use  facility stock  . bisacodyl (DULCOLAX) 5 MG EC tablet Take 5 mg by mouth 3 (three) times daily.  Marland Kitchen buPROPion (WELLBUTRIN SR) 150 MG 12 hr tablet TAKE 1 TABLET BY MOUTH TWICE DAILY  . Calcium Carb-Cholecalciferol 600-800 MG-UNIT TABS Take 1 tablet by mouth daily.   . calcium carbonate (TUMS - DOSED IN MG ELEMENTAL CALCIUM) 500 MG chewable tablet Chew 1-2 tablets by mouth every 4 (four) hours as needed for indigestion or heartburn. indigestion/ abd pain (1- mild; 2- moderate sx)  . Cholecalciferol 4000 units CAPS Take 4,000 Units by mouth daily.  . cloNIDine (CATAPRES) 0.1 MG  tablet TAKE ONE TABLET BY MOUTH TWICE DAILY  . diltiazem (CARDIZEM) 30 MG tablet TAKE ONE TABLET 3 TIMES DAILY  . fexofenadine (ALLEGRA) 180 MG tablet Take 180 mg by mouth daily as needed for allergies or rhinitis.  Marland Kitchen losartan-hydrochlorothiazide (HYZAAR) 50-12.5 MG tablet TAKE 1 TABLET BY MOUTH DAILY  . Multiple Vitamins-Minerals (CENTRUM SILVER) tablet Take 1 tablet by mouth daily.   . nitroGLYCERIN (NITRODUR - DOSED IN MG/24 HR) 0.4 mg/hr patch APPLY 1 PATCH DAILY LEAVE ON 12 TO 14 HOURS AND REMOVE FOR 10-12 HOURS BEFORE APPLYING NEXT PATCH  . Nutritional Supplements (NUTRITIONAL SUPPLEMENT PO) Diet Type: Regular  . Omega-3 Fatty Acids (FISH OIL) 1200 MG CAPS Take 1 capsule by mouth daily.   Marland Kitchen omeprazole (PRILOSEC) 20 MG capsule Take 20 mg by mouth 2 (two) times daily.  . phenylephrine (,USE FOR PREPARATION-H,) 0.25 % suppository Place 1 suppository rectally every 4 (four) hours as needed for hemorrhoids. to relieve swelling/bleeding for hemorrhoid.  Vladimir Faster Glycol-Propyl Glycol 0.4-0.3 % SOLN Place 1 drop into both eyes as needed (dry eyes).   . potassium chloride (K-DUR) 10 MEQ tablet TAKE 1 TABLET BY MOUTH DAILY  . sertraline (ZOLOFT) 100 MG tablet TAKE TWO TABLETS BY MOUTH AT BEDTIME  . traMADol (ULTRAM) 50 MG tablet Take 1 tablet (50 mg total) by mouth every 6 (six) hours as needed for moderate pain.  . vitamin C (ASCORBIC ACID) 500 MG tablet Take 500 mg by mouth at bedtime.  . [DISCONTINUED] bisacodyl (DULCOLAX) 10 MG suppository Place 10 mg rectally daily as needed.   No facility-administered encounter medications on file as of 08/02/2018.     Review of Systems  GENERAL: No change in appetite, no fatigue, no weight changes, no fever, chills or weakness SKIN: Denies rash, itching, wounds, ulcer sores, or nail abnormalities EYES: Denies change in vision, dry eyes, eye pain, itching or discharge EARS: Denies change in hearing, ringing in ears, or earache NOSE: Denies nasal  congestion or epistaxis MOUTH and THROAT: Denies oral discomfort, gingival pain or bleeding, pain from teeth or hoarseness   RESPIRATORY: no cough, SOB, DOE, wheezing, hemoptysis CARDIAC: No chest pain, edema or palpitations GI: No abdominal pain, diarrhea, constipation, heart burn, nausea or vomiting GU: Denies dysuria, frequency, hematuria, incontinence, or discharge MUSCULOSKELETAL: Denies joint pain, muscle pain, back pain, restricted movement, or unusual weakness CIRCULATION: Denies claudication, edema of legs, varicosities, or cold extremities NEUROLOGICAL: Denies dizziness, syncope, numbness, or headache PSYCHIATRIC: Denies feelings of depression or anxiety. No report of hallucinations, insomnia, paranoia, or agitation ENDOCRINE: Denies polyphagia, polyuria, polydipsia, heat or cold intolerance HEME/LYMPH: Denies excessive bruising, petechia, enlarged lymph nodes, or bleeding problems IMMUNOLOGIC: Denies history of frequent infections, AIDS, or use of immunosuppressive agents   Immunization History  Administered Date(s) Administered  . Influenza, High Dose Seasonal PF 08/25/2016  . Influenza-Unspecified 07/13/2017  . Pneumococcal Conjugate-13  11/15/2017  . Pneumococcal Polysaccharide-23 03/25/2014, 07/25/2014  . Td 06/20/2015   Pertinent  Health Maintenance Due  Topic Date Due  . INFLUENZA VACCINE  05/25/2018  . DEXA SCAN  Completed   Fall Risk  01/31/2017 12/18/2015 11/27/2014  Falls in the past year? No Yes Yes  Number falls in past yr: - 2 or more 2 or more  Injury with Fall? - Yes -  Risk Factor Category  - High Fall Risk -  Risk for fall due to : - History of fall(s);Impaired balance/gait;Impaired mobility Impaired balance/gait;History of fall(s)  Follow up - Follow up appointment -     Vitals:   08/02/18 0819  BP: 124/68  Pulse: 78  Resp: 16  Temp: 97.7 F (36.5 C)  TempSrc: Oral  SpO2: 95%  Weight: 146 lb 11.2 oz (66.5 kg)  Height: 5\' 3"  (1.6 m)   Body mass  index is 25.99 kg/m.  Physical Exam  GENERAL APPEARANCE: Well nourished. In no acute distress. Normal body habitus SKIN:  Skin is warm and dry. There are no suspicious lesions or rash HEAD: Normal in size and contour. No evidence of trauma EYES: Lids open and close normally. No blepharitis, entropion or ectropion. PERRL. Conjunctivae are clear and sclerae are white. Lenses are without opacity EARS: Pinnae are normal. Patient hears normal voice tunes of the examiner MOUTH and THROAT: Lips are without lesions. Oral mucosa is moist and without lesions. Tongue is normal in shape, size, and color and without lesions NECK: supple, trachea midline, no neck masses, no thyroid tenderness, no thyromegaly LYMPHATICS: No LAN in the neck, no supraclavicular LAN RESPIRATORY: Breathing is even & unlabored, BS CTAB CARDIAC: RRR, no murmur,no extra heart sounds, no edema GI: Abdomen soft, normal BS, no masses, no tenderness, no hepatomegaly, no splenomegaly MUSCULOSKELETAL: No deformities. Movement at each extremity is full and painless. Strength is 5/5 at each extremity. Back is without kyphosis or scoliosis CIRCULATION: Pedal pulses are 2+. There is no edema of the legs, ankles and feet NEUROLOGICAL: There is no tremor. Speech is clear PSYCHIATRIC: Alert and oriented X 3. Affect and behavior are appropriate  Labs reviewed: Recent Labs    06/16/18 1330 06/19/18 1320 06/28/18 0600  NA 139 136 137  K 3.6 3.9 4.3  CL 105 103 103  CO2 25 24 24   GLUCOSE 70 113* 98  BUN 13 19 22   CREATININE 0.83 0.84 0.79  CALCIUM 9.2 9.6 9.7   Recent Labs    05/22/18 0355 05/23/18 0407 05/24/18 0350  AST 35 279* 101*  ALT 18 274* 126*  ALKPHOS 58 80 89  BILITOT 0.8 0.6 0.9  PROT 6.4* 5.5* 6.4*  ALBUMIN 4.0 3.5 3.7   Recent Labs    02/17/18 0148  02/18/18 0328 05/22/18 0355 06/28/18 0600  WBC 9.8   < > 7.7 9.9 8.9  NEUTROABS 6.3  --   --   --  5.7  HGB 11.9*   < > 10.3* 13.1 13.1  HCT 34.6*   < >  30.0* 38.2 38.1  MCV 89.5   < > 89.9 87.0 88.6  PLT 202   < > 145* 179 210   < > = values in this interval not displayed.   Lab Results  Component Value Date   TSH 3.381 06/28/2018   Lab Results  Component Value Date   HGBA1C 5.6 11/30/2017   Lab Results  Component Value Date   CHOL 325 (H) 11/27/2014   HDL 47.80 11/27/2014  LDLDIRECT 226.0 11/27/2014   TRIG 289.0 (H) 11/27/2014   CHOLHDL 7 11/27/2014    Assessment/Plan [facility] [phone] (Monday-Friday 8:00 a.m. - 5:00 p.m.) [phone] (after hours)  This encounter was created in error - please disregard.

## 2018-08-07 ENCOUNTER — Encounter: Payer: Self-pay | Admitting: Adult Health

## 2018-08-07 NOTE — Progress Notes (Signed)
This encounter was created in error - please disregard.

## 2018-08-07 NOTE — Progress Notes (Signed)
[facility]   [facility] Room Number: 335 { Place of Service:  SNF (31)   CODE STATUS: DNR  Allergies  Allergen Reactions  . Codeine Other (See Comments)    Other reaction(s): Headache  . Hydrocodone-Acetaminophen Nausea And Vomiting  . Meperidine     Other reaction(s): Dizziness  . Niacin Er Other (See Comments)    Patient doesn't remember  . Other Nausea Only  . Propoxyphene Other (See Comments)    Other reaction(s): Dizziness  . Solifenacin Other (See Comments)    Other reaction(s): Abdominal Pain  . Statins     Other reaction(s): Muscle Pain Weakness  . Sulfa Antibiotics Other (See Comments)    Patient doesn' t remember  . Fish-Derived Products Rash    Weakness  . Nabumetone Rash  . Oxycodone-Acetaminophen Rash    Couldn't think or remember anything    Chief Complaint  Patient presents with  . Acute Visit    Left wrist swelling    HPI:    Past Medical History:  Diagnosis Date  . Arthritis   . Blood in stool   . Cancer ([place])    skin  . Chicken pox    SHINGLES TWICE  . Colon polyps   . Depression   . Diverticulitis   . GERD (gastroesophageal reflux disease)   . Hay fever   . Heart disease   . Heart murmur   . Hyperlipidemia   . UTI (urinary tract infection)   . Varicose veins of both lower extremities     Past Surgical History:  Procedure Laterality Date  . HERNIA REPAIR    . KNEE SURGERY    . ORIF ANKLE FRACTURE Right 05/22/2018   Procedure: OPEN REDUCTION INTERNAL FIXATION (ORIF) TRIMALLEOLAR  ANKLE FRACTURE;  Surgeon: Leim Fabry, MD;  Location: [facility] ORS;  Service: Orthopedics;  Laterality: Right;    Social History   Socioeconomic History  . Marital status: Widowed    Spouse name: Not on file  . Number of children: Not on file  . Years of education: Not on file  . Highest education level: Not on file  Occupational History  . Not on file  Social Needs  . Financial resource strain: Not on file  . Food  insecurity:    Worry: Not on file    Inability: Not on file  . Transportation needs:    Medical: Not on file    Non-medical: Not on file  Tobacco Use  . Smoking status: Never Smoker  . Smokeless tobacco: Never Used  Substance and Sexual Activity  . Alcohol use: No    Alcohol/week: 0.0 standard drinks  . Drug use: No  . Sexual activity: Never  Lifestyle  . Physical activity:    Days per week: Not on file    Minutes per session: Not on file  . Stress: Not on file  Relationships  . Social connections:    Talks on phone: Not on file    Gets together: Not on file    Attends religious service: Not on file    Active member of club or organization: Not on file    Attends meetings of clubs or organizations: Not on file    Relationship status: Not on file  . Intimate partner violence:    Fear of current or ex partner: Not on file    Emotionally abused: Not on file    Physically abused: Not on file    Forced sexual activity: Not on  file  Other Topics Concern  . Not on file  Social History Narrative  . Not on file   Family History  Problem Relation Age of Onset  . Stroke Mother   . Heart attack Father       VITAL SIGNS BP (!) 126/58   Pulse (!) 16   Temp 97.7 F (36.5 C)   Resp 16   Ht 5\' 3"  (1.6 m)   Wt 146 lb 11.2 oz (66.5 kg)   SpO2 95%   BMI 25.99 kg/m   Outpatient Encounter Medications as of 08/07/2018  Medication Sig  . acetaminophen (TYLENOL) 650 MG CR tablet Take 650 mg by mouth every 8 (eight) hours as needed for pain.  Marland Kitchen alum & mag hydroxide-simeth (MAALOX PLUS) 400-400-40 MG/5ML suspension Take 30 mLs by mouth every 4 (four) hours as needed for indigestion. Ok to use facility stock  . bisacodyl (DULCOLAX) 10 MG suppository Place 10 mg rectally daily as needed for moderate constipation.  . bisacodyl (DULCOLAX) 5 MG EC tablet Take 5 mg by mouth 3 (three) times daily.  Marland Kitchen buPROPion (WELLBUTRIN SR) 150 MG 12 hr tablet TAKE 1 TABLET BY MOUTH TWICE DAILY  .  Calcium Carb-Cholecalciferol 600-800 MG-UNIT TABS Take 1 tablet by mouth daily.   . calcium carbonate (TUMS - DOSED IN MG ELEMENTAL CALCIUM) 500 MG chewable tablet Chew 1-2 tablets by mouth every 4 (four) hours as needed for indigestion or heartburn. indigestion/ abd pain (1- mild; 2- moderate sx)  . Cholecalciferol 4000 units CAPS Take 4,000 Units by mouth daily.  . cloNIDine (CATAPRES) 0.1 MG tablet TAKE ONE TABLET BY MOUTH TWICE DAILY  . diltiazem (CARDIZEM) 30 MG tablet TAKE ONE TABLET 3 TIMES DAILY  . fexofenadine (ALLEGRA) 180 MG tablet Take 180 mg by mouth daily as needed for allergies or rhinitis.  . hydrocortisone cream 1 % Apply 1 application topically 2 (two) times daily.  Marland Kitchen losartan-hydrochlorothiazide (HYZAAR) 50-12.5 MG tablet TAKE 1 TABLET BY MOUTH DAILY  . Multiple Vitamins-Minerals (CENTRUM SILVER) tablet Take 1 tablet by mouth daily.   . nitroGLYCERIN (NITRODUR - DOSED IN MG/24 HR) 0.4 mg/hr patch APPLY 1 PATCH DAILY LEAVE ON 12 TO 14 HOURS AND REMOVE FOR 10-12 HOURS BEFORE APPLYING NEXT PATCH  . Nutritional Supplements (NUTRITIONAL SUPPLEMENT PO) Diet Type: Regular  . Omega-3 Fatty Acids (FISH OIL) 1200 MG CAPS Take 1 capsule by mouth daily.   Marland Kitchen omeprazole (PRILOSEC) 20 MG capsule Take 20 mg by mouth 2 (two) times daily.  . phenylephrine (,USE FOR PREPARATION-H,) 0.25 % suppository Place 1 suppository rectally every 4 (four) hours as needed for hemorrhoids. to relieve swelling/bleeding for hemorrhoid.  Vladimir Faster Glycol-Propyl Glycol 0.4-0.3 % SOLN Place 1 drop into both eyes as needed (dry eyes).   . potassium chloride (K-DUR) 10 MEQ tablet TAKE 1 TABLET BY MOUTH DAILY  . sertraline (ZOLOFT) 100 MG tablet TAKE TWO TABLETS BY MOUTH AT BEDTIME  . traMADol (ULTRAM) 50 MG tablet Take 1 tablet (50 mg total) by mouth every 6 (six) hours as needed for moderate pain.  . vitamin C (ASCORBIC ACID) 500 MG tablet Take 500 mg by mouth at bedtime.   No facility-administered encounter  medications on file as of 08/07/2018.      SIGNIFICANT DIAGNOSTIC EXAMS  LABS REVIEWED: TODAY:   05-22-18: wbc 9.9; hgb 13.1; hct 38.2; mcv 87.0; plt 179; glucose 119; bun 17; creat 0.84; k+ 4.0; na++ 139; ca 9.3; liver normal albumin 4.0; tsh 4.268 05-23-18: glucose 114;  bun 13; creat 0.70; k+ 3.8; na++ 141; ca 8.4; ast 279; alt 274; albumin 3.5  05-24-18: glucose 123; bun 11; creat 0.57; k+ 3.5; na++ 138; ca 8.9; ast 101; alt 126; ca 8.9  TODAY:   06-16-18: glucose 70; bun 13; creat 0.83; k+ 3.6; na++ 139; ca 8.2 06-19-18: glucose 113; bun 19; creat 0.84; k+ 3.9; na++ 136; ca 9.6 06-28-18: wbc 8.9; hgb 13.1; hct 38.1; mcv 88.6; plt 210; glucose 98; bun 22; creat 0.79; k+ 4.3; na++ 137; ca 9.7; tsh 3.381; urine culture: <10,000 colonies    Review of Systems  Constitutional: Negative for malaise/fatigue.  Respiratory: Negative for cough and shortness of breath.   Cardiovascular: Negative for chest pain, palpitations and leg swelling.  Gastrointestinal: Negative for abdominal pain, constipation and heartburn.  Musculoskeletal: Negative for back pain, joint pain and myalgias.  Skin: Negative.   Neurological: Negative for dizziness.  Psychiatric/Behavioral: The patient is not nervous/anxious.    Physical Exam  Constitutional: She is oriented to person, place, and time. She appears well-developed and well-nourished. No distress.  Neck: No thyromegaly present.  Cardiovascular: Normal rate, regular rhythm, normal heart sounds and intact distal pulses.  Pulmonary/Chest: Effort normal and breath sounds normal. No respiratory distress.  Abdominal: Soft. Bowel sounds are normal. She exhibits no distension. There is no tenderness.  Musculoskeletal: She exhibits no edema.  Is able to move all extremities  Status post right ankle fracture   Lymphadenopathy:    She has no cervical adenopathy.  Neurological: She is alert and oriented to person, place, and time.  Skin: Skin is warm and dry. She is  not diaphoretic.  Psychiatric: She has a normal mood and affect.    ASSESSMENT/ PLAN:  TODAY:   1. Bipolar affective disorder: is stable will continue wellbutrin SR 150 mg twice daily and zoloft 200 mg daily   2. Benign essential hypertension: stable b/p 140/60  will continue clonidine 0.1 mg twice daily and hyzaar 50/12.5 mg daily  cardizem 30 mg three times daily   3. CAD is native artery: is stable will continue cardizem 30 mg three times daily ntg patch 0.4 mg daily asa 325 mg daily   4. gerd without esophagitis: is stable will continue prilosec 20 mg twice daily    PREVIOUS   5. Chronic constipation: is stable will continue dulcolax tab 5 mg three times daily   6. Right closed ankle fracture: does have RA: is stable will continue therapy as directed and will follow up with orthopedics; will continue ultram 50 mg every 6 hours as needed  7. Chronic anemia: is stable hgb 13.1;  8. Hypokalemia: stable k+ 4.3; will continue k+ 10 meq daily   MD is aware of resident's narcotic use and is in agreement with current plan of care. We will attempt to wean resident as apropriate   Ok Edwards NP [facility]  Contact [phone] Monday through Friday 8am- 5pm  After hours call [phone]

## 2018-08-08 ENCOUNTER — Non-Acute Institutional Stay (SKILLED_NURSING_FACILITY): Payer: Medicare Other | Admitting: Adult Health

## 2018-08-08 ENCOUNTER — Encounter: Payer: Self-pay | Admitting: Adult Health

## 2018-08-08 DIAGNOSIS — I2510 Atherosclerotic heart disease of native coronary artery without angina pectoris: Secondary | ICD-10-CM | POA: Diagnosis not present

## 2018-08-08 DIAGNOSIS — I10 Essential (primary) hypertension: Secondary | ICD-10-CM | POA: Diagnosis not present

## 2018-08-08 DIAGNOSIS — F339 Major depressive disorder, recurrent, unspecified: Secondary | ICD-10-CM

## 2018-08-08 DIAGNOSIS — K219 Gastro-esophageal reflux disease without esophagitis: Secondary | ICD-10-CM

## 2018-08-08 NOTE — Progress Notes (Signed)
[facility]:  [facility] Room Number: 335-P Place of Service:  SNF (31) Provider:  Durenda Age, NP  Patient Care Team: [name], MD as PCP - General (Family Medicine)  Extended Emergency Contact Information Primary Emergency Contact: Cindy Fitzpatrick Address: [address]          [place], [place] [address] [name] Phone: [phone] Work Phone: [phone] Mobile Phone: [phone] Relation: Son Secondary Emergency Contact: [name] Address: [address]          [place], [place] [address] [name] of [place] Phone: [phone] Work Phone: [phone] Mobile Phone: [phone] Relation: Son  Code Status:  DNR  Goals of care: Advanced Directive information Advanced Directives 08/08/2018  Does Patient Have a Medical Advance Directive? Yes  Type of Advance Directive Out of facility DNR (pink MOST or yellow form)  Does patient want to make changes to medical advance directive? No - Patient declined  Copy of [place] in Chart? -  Would patient like information on creating a medical advance directive? -  Pre-existing out of facility DNR order (yellow form or pink MOST form) -     Chief Complaint  Patient presents with  . Medical Management of Chronic Issues    Routine [facility] visit    HPI:  Pt is an 82 y.o. female seen today for medical management of chronic diseases.  Cindy Fitzpatrick is a long-term care resident of [facility]  Cindy Fitzpatrick has a PMH of GERD, arthritis, and skin cancer. Cindy Fitzpatrick was seen in her room today. Cindy Fitzpatrick denies any concerns. BPs has been stable.   Past Medical History:  Diagnosis Date  . Arthritis   . Blood in stool   . Cancer ([place])    skin  . Chicken pox    SHINGLES TWICE  . Colon polyps   . Depression   . Diverticulitis   . GERD (gastroesophageal reflux disease)   . Hay fever   . Heart disease   . Heart murmur   . Hyperlipidemia   . UTI (urinary  tract infection)   . Varicose veins of both lower extremities    Past Surgical History:  Procedure Laterality Date  . HERNIA REPAIR    . KNEE SURGERY    . ORIF ANKLE FRACTURE Right 05/22/2018   Procedure: OPEN REDUCTION INTERNAL FIXATION (ORIF) TRIMALLEOLAR  ANKLE FRACTURE;  Surgeon: Leim Fabry, MD;  Location: [facility] ORS;  Service: Orthopedics;  Laterality: Right;    Allergies  Allergen Reactions  . Codeine Other (See Comments)    Other reaction(s): Headache  . Hydrocodone-Acetaminophen Nausea And Vomiting  . Meperidine     Other reaction(s): Dizziness  . Niacin Er Other (See Comments)    Patient doesn't remember  . Other Nausea Only  . Propoxyphene Other (See Comments)    Other reaction(s): Dizziness  . Solifenacin Other (See Comments)    Other reaction(s): Abdominal Pain  . Statins     Other reaction(s): Muscle Pain Weakness  . Sulfa Antibiotics Other (See Comments)    Patient doesn' t remember  . Fish-Derived Products Rash    Weakness  . Nabumetone Rash  . Oxycodone-Acetaminophen Rash    Couldn't think or remember anything    Outpatient Encounter Medications as of 08/08/2018  Medication Sig  . acetaminophen (TYLENOL) 650 MG CR tablet Take 650 mg by mouth every 8 (eight) hours as needed for pain.  Marland Kitchen alum & mag hydroxide-simeth (MAALOX PLUS) 400-400-40 MG/5ML  suspension Take 30 mLs by mouth every 4 (four) hours as needed for indigestion. Ok to use facility stock  . bisacodyl (DULCOLAX) 10 MG suppository Place 10 mg rectally daily as needed for moderate constipation.  . bisacodyl (DULCOLAX) 5 MG EC tablet Take 5 mg by mouth 3 (three) times daily.  Marland Kitchen buPROPion (WELLBUTRIN SR) 150 MG 12 hr tablet TAKE 1 TABLET BY MOUTH TWICE DAILY  . Calcium Carb-Cholecalciferol 600-800 MG-UNIT TABS Take 1 tablet by mouth daily.   . calcium carbonate (TUMS - DOSED IN MG ELEMENTAL CALCIUM) 500 MG chewable tablet Chew 1-2 tablets by mouth every 4 (four) hours as needed for indigestion or  heartburn. indigestion/ abd pain (1- mild; 2- moderate sx)  . Cholecalciferol 4000 units CAPS Take 4,000 Units by mouth daily.  . cloNIDine (CATAPRES) 0.1 MG tablet TAKE ONE TABLET BY MOUTH TWICE DAILY  . diltiazem (CARDIZEM) 30 MG tablet TAKE ONE TABLET 3 TIMES DAILY  . fexofenadine (ALLEGRA) 180 MG tablet Take 180 mg by mouth daily as needed for allergies or rhinitis.  . hydrocortisone cream 1 % Apply 1 application topically 2 (two) times daily.  Marland Kitchen losartan-hydrochlorothiazide (HYZAAR) 50-12.5 MG tablet TAKE 1 TABLET BY MOUTH DAILY  . Multiple Vitamins-Minerals (CENTRUM SILVER) tablet Take 1 tablet by mouth daily.   . nitroGLYCERIN (NITRODUR - DOSED IN MG/24 HR) 0.4 mg/hr patch APPLY 1 PATCH DAILY LEAVE ON 12 TO 14 HOURS AND REMOVE FOR 10-12 HOURS BEFORE APPLYING NEXT PATCH  . Nutritional Supplements (NUTRITIONAL SUPPLEMENT PO) Diet Type: Regular  . Omega-3 Fatty Acids (FISH OIL) 1200 MG CAPS Take 1 capsule by mouth daily.   Marland Kitchen omeprazole (PRILOSEC) 20 MG capsule Take 20 mg by mouth 2 (two) times daily.  . phenylephrine (,USE FOR PREPARATION-H,) 0.25 % suppository Place 1 suppository rectally every 4 (four) hours as needed for hemorrhoids. to relieve swelling/bleeding for hemorrhoid.  Vladimir Faster Glycol-Propyl Glycol 0.4-0.3 % SOLN Place 1 drop into both eyes as needed (dry eyes).   . potassium chloride (K-DUR) 10 MEQ tablet TAKE 1 TABLET BY MOUTH DAILY  . sertraline (ZOLOFT) 100 MG tablet TAKE TWO TABLETS BY MOUTH AT BEDTIME  . traMADol (ULTRAM) 50 MG tablet Take 1 tablet (50 mg total) by mouth every 6 (six) hours as needed for moderate pain.  . vitamin C (ASCORBIC ACID) 500 MG tablet Take 500 mg by mouth at bedtime.   No facility-administered encounter medications on file as of 08/08/2018.     Review of Systems  GENERAL: No change in appetite, no fatigue, no weight changes, no fever, chills or weakness MOUTH and THROAT: Denies oral discomfort, gingival pain or bleedingRESPIRATORY: no  cough, SOB, DOE, wheezing, hemoptysis CARDIAC: No chest pain, edema or palpitations GI: No abdominal pain, diarrhea, constipation, heart burn, nausea or vomiting GU: Denies dysuria, frequency, hematuria,  or discharge PSYCHIATRIC: Denies feelings of depression or anxiety. No report of hallucinations, insomnia, paranoia, or agitation    Immunization History  Administered Date(s) Administered  . Influenza, High Dose Seasonal PF 08/25/2016  . Influenza-Unspecified 05/26/2015, 07/13/2017, 08/01/2018  . Pneumococcal Conjugate-13 11/15/2017  . Pneumococcal Polysaccharide-23 03/25/2014, 07/25/2014  . Td 06/20/2015  . Zoster 05/12/1998   Pertinent  Health Maintenance Due  Topic Date Due  . INFLUENZA VACCINE  Completed  . DEXA SCAN  Completed   Fall Risk  01/31/2017 12/18/2015 11/27/2014  Falls in the past year? No Yes Yes  Number falls in past yr: - 2 or more 2 or more  Injury with Fall? -  Yes -  Risk Factor Category  - High Fall Risk -  Risk for fall due to : - History of fall(s);Impaired balance/gait;Impaired mobility Impaired balance/gait;History of fall(s)  Follow up - Follow up appointment -      Vitals:   08/08/18 1557  BP: (!) 140/50  Pulse: 78  Resp: 16  Temp: 97.7 F (36.5 C)  TempSrc: Oral  SpO2: 95%  Weight: 146 lb 11.2 oz (66.5 kg)  Height: 5\' 3"  (1.6 m)   Body mass index is 25.99 kg/m.  Physical Exam  GENERAL APPEARANCE: Well nourished. In no acute distress. Normal body habitus SKIN:  Right inner ankle with black eschar with dressing EARS: Pinnae are normal. Wears bilateral hearing aids MOUTH and THROAT: Lips are without lesions. Oral mucosa is moist and without lesions. Tongue is normal in shape, size, and color and without lesions RESPIRATORY: Breathing is even & unlabored, BS CTAB CARDIAC: RRR, +murmur,no extra heart sounds, no edema GI: Abdomen soft, normal BS, no masses, no tenderness EXTREMITIES:  Able to move X 4 extremities NEUROLOGICAL: There is no  tremor. Speech is clear. Alert and oriented X 3. PSYCHIATRIC:  Affect and behavior are appropriate  Labs reviewed: Recent Labs    06/16/18 1330 06/19/18 1320 06/28/18 0600  NA 139 136 137  K 3.6 3.9 4.3  CL 105 103 103  CO2 25 24 24   GLUCOSE 70 113* 98  BUN 13 19 22   CREATININE 0.83 0.84 0.79  CALCIUM 9.2 9.6 9.7   Recent Labs    05/22/18 0355 05/23/18 0407 05/24/18 0350  AST 35 279* 101*  ALT 18 274* 126*  ALKPHOS 58 80 89  BILITOT 0.8 0.6 0.9  PROT 6.4* 5.5* 6.4*  ALBUMIN 4.0 3.5 3.7   Recent Labs    02/17/18 0148  02/18/18 0328 05/22/18 0355 06/28/18 0600  WBC 9.8   < > 7.7 9.9 8.9  NEUTROABS 6.3  --   --   --  5.7  HGB 11.9*   < > 10.3* 13.1 13.1  HCT 34.6*   < > 30.0* 38.2 38.1  MCV 89.5   < > 89.9 87.0 88.6  PLT 202   < > 145* 179 210   < > = values in this interval not displayed.   Lab Results  Component Value Date   TSH 3.381 06/28/2018   Lab Results  Component Value Date   HGBA1C 5.6 11/30/2017   Lab Results  Component Value Date   CHOL 325 (H) 11/27/2014   HDL 47.80 11/27/2014   LDLDIRECT 226.0 11/27/2014   TRIG 289.0 (H) 11/27/2014   CHOLHDL 7 11/27/2014    Assessment/Plan  1. Benign essential HTN -  well-controlled, continue Cardizem 30 mg 1 tab 3 times daily, clonidine 0.1 mg 1 tab twice daily, losartan-hydrochlorothiazide 50-12.5 mg 1 tab daily     2. CAD in native artery -no complaints of chest pain, continue NTG as needed   3. Recurrent major depressive disorder, remission status unspecified (HCC) -  Mood is stable, continue sertraline 100 mg 2 tabs = 200 mg nightly and bupropion sustained-release 150 mg twice a day   4. GERD without esophagitis -stable, continue omeprazole 20 mg 1 capsule twice a day    Family/ staff Communication: Discussed plan of care with resident.  Labs/tests ordered:  None  Goals of care:   Long-term care.   Durenda Age, NP [facility] [phone]  (Monday-Friday 8:00 a.m. - 5:00 p.m.) [phone] (after hours)

## 2018-08-11 ENCOUNTER — Other Ambulatory Visit: Payer: Self-pay

## 2018-08-11 MED ORDER — TRAMADOL HCL 50 MG PO TABS: 50.0000 mg | ORAL_TABLET | Freq: Four times a day (QID) | ORAL | 0 refills | Status: DC | PRN

## 2018-08-11 NOTE — Telephone Encounter (Signed)
Hard script given to social worker with discharge paperwork.

## 2018-08-24 DIAGNOSIS — F32A Depression, unspecified: Secondary | ICD-10-CM | POA: Insufficient documentation

## 2018-08-24 DIAGNOSIS — F329 Major depressive disorder, single episode, unspecified: Secondary | ICD-10-CM | POA: Insufficient documentation

## 2018-09-11 ENCOUNTER — Other Ambulatory Visit: Payer: Self-pay | Admitting: Family Medicine

## 2018-09-13 ENCOUNTER — Ambulatory Visit: Payer: Medicare Other | Admitting: Family Medicine

## 2018-09-13 ENCOUNTER — Encounter: Payer: Self-pay | Admitting: Family Medicine

## 2018-09-13 DIAGNOSIS — S8012XD Contusion of left lower leg, subsequent encounter: Secondary | ICD-10-CM

## 2018-09-13 DIAGNOSIS — K439 Ventral hernia without obstruction or gangrene: Secondary | ICD-10-CM

## 2018-09-13 DIAGNOSIS — I10 Essential (primary) hypertension: Secondary | ICD-10-CM

## 2018-09-13 DIAGNOSIS — S82891S Other fracture of right lower leg, sequela: Secondary | ICD-10-CM

## 2018-09-13 DIAGNOSIS — I2510 Atherosclerotic heart disease of native coronary artery without angina pectoris: Secondary | ICD-10-CM | POA: Diagnosis not present

## 2018-09-13 MED ORDER — NITROGLYCERIN 0.4 MG/HR TD PT24: MEDICATED_PATCH | TRANSDERMAL | 2 refills | Status: DC

## 2018-09-13 MED ORDER — NITROGLYCERIN 0.4 MG SL SUBL: 0.4000 mg | SUBLINGUAL_TABLET | SUBLINGUAL | 3 refills | Status: DC | PRN

## 2018-09-13 MED ORDER — TRAMADOL HCL 50 MG PO TABS: 50.0000 mg | ORAL_TABLET | Freq: Four times a day (QID) | ORAL | 0 refills | Status: DC | PRN

## 2018-09-13 NOTE — Patient Instructions (Signed)
Nice to see you. Please let us know if the tramadol makes you drowsy. Please continue to follow with orthopedics. If your hernia becomes stuck or painful or you have lack of bowel movements or passing gas or nausea or vomiting please seek medical attention immediately.

## 2018-09-14 NOTE — Assessment & Plan Note (Signed)
Well-controlled.  Continue current regimen. 

## 2018-09-14 NOTE — Assessment & Plan Note (Signed)
Appears to have improved significantly.  She will monitor.

## 2018-09-14 NOTE — Assessment & Plan Note (Signed)
Patient with chronic issues of chest pain and dyspnea on exertion.  These do not occur very frequently.  I have advised cardiology evaluation though she has declined again.  She will continue as needed nitroglycerin.  Discussed appropriate use of this.  Given return precautions.

## 2018-09-14 NOTE — Assessment & Plan Note (Addendum)
She will continue to see orthopedics.  Tramadol refilled.  Controlled substance database reviewed.

## 2018-09-14 NOTE — Assessment & Plan Note (Signed)
Appears to be somewhat larger.  Fully reduces.  No signs of obstruction.  Discussed referral to a surgeon though she declines.  Discussed hernia return precautions.

## 2018-09-14 NOTE — Progress Notes (Signed)
Tommi Rumps, MD Phone: [phone]  Cindy Fitzpatrick is a 82 y.o. female who presents today for f/u.  CC: htn, chronic chest pain, ventral hernia, leg hematoma, ankle fracture  Hypertension: Not checking at home.  She is taking losartan, HCTZ, diltiazem, and clonidine.  She does note occasional central chest discomfort for which she takes nitroglycerin.  She notes no diaphoresis with the chest pain.  She does note occasional dyspnea on exertion though is unsure how often this occurs.  She has declined further evaluation with cardiology.  She notes her bilateral leg edema is improved as well.  Ankle fracture: Patient underwent open reduction internal fixation of her ankle fracture in July.  She continues to see surgery.  She has been doing physical therapy.  She takes tramadol as needed for pain.  No drowsiness.  No alcohol use with this.  Ventral hernia: Patient notes this has gotten larger.  Occasionally there is some discomfort though it does always reduce when she lays down.  She has normal bowel movements.  She does pass gas.  No nausea or vomiting.  She had a left leg hematoma.  She underwent an ultrasound which ruled out DVT.  She notes that the swelling from her hematoma has improved.  Social History   Tobacco Use  Smoking Status Never Smoker  Smokeless Tobacco Never Used     ROS see history of present illness  Objective  Physical Exam Vitals:   09/13/18 1353  BP: 118/72  Pulse: 92  Temp: (!) 97.5 F (36.4 C)  SpO2: 98%    BP Readings from Last 3 Encounters:  09/13/18 118/72  08/08/18 (!) 140/50  08/07/18 (!) 126/58   Wt Readings from Last 3 Encounters:  09/13/18 143 lb 9.6 oz (65.1 kg)  08/08/18 146 lb 11.2 oz (66.5 kg)  08/07/18 146 lb 11.2 oz (66.5 kg)    Physical Exam  Constitutional: No distress.  Cardiovascular: Normal rate, regular rhythm and normal heart sounds.  Pulmonary/Chest: Effort normal and breath sounds normal.  Abdominal: Soft. Bowel  sounds are normal. She exhibits no distension. There is no tenderness. There is no rebound and no guarding. A hernia (Central ventral hernia that is fully reducible) is present.  Musculoskeletal: She exhibits edema (trace to lower shin).  Neurological: She is alert.  Skin: Skin is warm and dry. She is not diaphoretic.     Assessment/Plan: Please see individual problem list.  Benign essential HTN Well-controlled.  Continue current regimen.  CAD in native artery Patient with chronic issues of chest pain and dyspnea on exertion.  These do not occur very frequently.  I have advised cardiology evaluation though she has declined again.  She will continue as needed nitroglycerin.  Discussed appropriate use of this.  Given return precautions.  Ankle fracture She will continue to see orthopedics.  Tramadol refilled.  Controlled substance database reviewed.  Traumatic hematoma of left lower leg Appears to have improved significantly.  She will monitor.  Ventral hernia Appears to be somewhat larger.  Fully reduces.  No signs of obstruction.  Discussed referral to a surgeon though she declines.  Discussed hernia return precautions.   No orders of the defined types were placed in this encounter.   Meds ordered this encounter  Medications  . traMADol (ULTRAM) 50 MG tablet    Sig: Take 1 tablet (50 mg total) by mouth every 6 (six) hours as needed for moderate pain.    Dispense:  28 tablet    Refill:  0  .  nitroGLYCERIN (NITROSTAT) 0.4 MG SL tablet    Sig: Place 1 tablet (0.4 mg total) under the tongue every 5 (five) minutes as needed for chest pain.    Dispense:  20 tablet    Refill:  3  . nitroGLYCERIN (NITRODUR - DOSED IN MG/24 HR) 0.4 mg/hr patch    Sig: APPLY 1 PATCH DAILY LEAVE ON 12 TO 14 HOURS AND REMOVE FOR 10-12 HOURS BEFORE APPLYING NEXT PATCH    Dispense:  30 patch    Refill:  2     Tommi Rumps, MD [place]

## 2018-09-25 ENCOUNTER — Other Ambulatory Visit: Payer: Self-pay | Admitting: Family Medicine

## 2018-10-17 ENCOUNTER — Other Ambulatory Visit: Payer: Self-pay | Admitting: Family Medicine

## 2018-11-06 ENCOUNTER — Other Ambulatory Visit: Payer: Self-pay | Admitting: Family Medicine

## 2018-11-08 NOTE — Telephone Encounter (Signed)
This was previously a short-term medication following her ankle fracture.  Please see what she continues to need this for.  Please see how frequently she has been taking it.

## 2018-11-10 NOTE — Telephone Encounter (Signed)
Short term refill sent to pharmacy

## 2018-11-13 ENCOUNTER — Other Ambulatory Visit: Payer: Self-pay | Admitting: Family Medicine

## 2018-11-20 ENCOUNTER — Other Ambulatory Visit: Payer: Self-pay | Admitting: Family Medicine

## 2018-12-05 ENCOUNTER — Other Ambulatory Visit: Payer: Self-pay | Admitting: Family Medicine

## 2018-12-19 ENCOUNTER — Other Ambulatory Visit: Payer: Self-pay | Admitting: Family Medicine

## 2019-01-02 ENCOUNTER — Other Ambulatory Visit: Payer: Self-pay | Admitting: Family Medicine

## 2019-01-12 ENCOUNTER — Ambulatory Visit: Payer: Medicare Other | Admitting: Family Medicine

## 2019-01-29 ENCOUNTER — Other Ambulatory Visit: Payer: Self-pay | Admitting: Family Medicine

## 2019-02-23 ENCOUNTER — Other Ambulatory Visit: Payer: Self-pay | Admitting: Family Medicine

## 2019-03-27 ENCOUNTER — Other Ambulatory Visit: Payer: Self-pay | Admitting: Family Medicine

## 2019-04-06 ENCOUNTER — Telehealth: Payer: Self-pay | Admitting: Family Medicine

## 2019-04-06 NOTE — Telephone Encounter (Signed)
Setup appt with brian at [facility] for a virtual visit on Monday.  Nicklas Mcsweeney,cma

## 2019-04-06 NOTE — Telephone Encounter (Signed)
That is ok. Can we get her set up with a virtual visit to evaluate her symptoms?

## 2019-04-06 NOTE — Telephone Encounter (Signed)
Copied from [place] [id number]. Topic: General - Other >> Apr 06, 2019 11:06 AM [name] wrote: Reason for CRM: Cindy Fitzpatrick  with [facility] is calling and pt is urinating frequent and fatigue. Pt does have history of uti. Aaron Edelman would like an order to do U/A and culture

## 2019-04-09 ENCOUNTER — Ambulatory Visit: Payer: Medicare Other | Admitting: Family Medicine

## 2019-04-10 ENCOUNTER — Telehealth: Payer: Self-pay | Admitting: Family Medicine

## 2019-04-10 NOTE — Telephone Encounter (Signed)
Please contact the patients living facility and see if they have gotten a urine culture result with sensitivities back yet. It appears her culture is positive for E coli though I would prefer to see sensitivities prior to placing her on an antibiotic if possible. Thanks.

## 2019-04-11 ENCOUNTER — Encounter: Payer: Self-pay | Admitting: Family Medicine

## 2019-04-11 ENCOUNTER — Telehealth: Payer: Self-pay | Admitting: Family Medicine

## 2019-04-11 ENCOUNTER — Other Ambulatory Visit: Payer: Self-pay

## 2019-04-11 ENCOUNTER — Ambulatory Visit (INDEPENDENT_AMBULATORY_CARE_PROVIDER_SITE_OTHER): Payer: Medicare Other | Admitting: Family Medicine

## 2019-04-11 DIAGNOSIS — N390 Urinary tract infection, site not specified: Secondary | ICD-10-CM | POA: Diagnosis not present

## 2019-04-11 MED ORDER — CEPHALEXIN 500 MG PO CAPS: 500.0000 mg | ORAL_CAPSULE | Freq: Two times a day (BID) | ORAL | 0 refills | Status: AC

## 2019-04-11 NOTE — Telephone Encounter (Signed)
Called Pt and told her that a Rx was sent to the pharmacy to treat the UTI, Pt stated Thank you she understood

## 2019-04-11 NOTE — Telephone Encounter (Signed)
Need urine culture result  RN Aaron Edelman from [facility] called to get urine orders on 6/12  Another message in epic about her being + for ecoli, but I cannot find urine results  I did phone visit with patient today  NEED CULTURE RESULTS so I can properly treat her  Please call [facility] and speak to RN brian to get these results sent here

## 2019-04-11 NOTE — Telephone Encounter (Signed)
Thanks

## 2019-04-11 NOTE — Telephone Encounter (Signed)
Patient is requesting for PCP to give her a call back asap. Regarding this encounter. Would not say much more. Call back [phone]

## 2019-04-11 NOTE — Telephone Encounter (Signed)
Fax had UA and CBC, no culture  Need the culture portion

## 2019-04-11 NOTE — Telephone Encounter (Signed)
Called [facility] facility and I couldn't reach Cindy Fitzpatrick he was in a meeting.  I was able to speak to a tech who took the message and stated she will fax the Pt culture results over to our office.

## 2019-04-11 NOTE — Progress Notes (Signed)
Patient ID: Cindy Fitzpatrick, female   DOB: December 15, 1935, 83 y.o.   MRN: [id number]    Virtual Visit via phone Note  This visit type was conducted due to national recommendations for restrictions regarding the COVID-19 pandemic (e.g. social distancing).  This format is felt to be most appropriate for this patient at this time.  All issues noted in this document were discussed and addressed.  No physical exam was performed (except for noted visual exam findings with Video Visits).   I connected with Erling Conte today at 10:40 AM EDT by telephone and verified that I am speaking with the correct person using two identifiers. Location patient: home Location provider: work or home office Persons participating in the virtual visit: patient, provider  I discussed the limitations, risks, security and privacy concerns of performing an evaluation and management service by telephone and the availability of in person appointments. I also discussed with the patient that there may be a patient responsible charge related to this service. The patient expressed understanding and agreed to proceed.   HPI: Patient and I connected via telephone today due to UTI.  Patient had urine collected on 04/06/2019 due to complaints of dark-colored urine, foul-smelling urine and increased urinary frequency.  Patient states she has dealt with the symptoms off and on for years, but seemed worse over the past few days.  Urine culture did come back positive for E. Coli.  Patient denies any fever or chills.  Denies nausea, vomiting or diarrhea.  States appetite is normal.  Denies any body aches.  No cough.  No chest pain, shortness of breath.   ROS: See pertinent positives and negatives per HPI.  Past Medical History:  Diagnosis Date  . Arthritis   . Blood in stool   . Cancer ([place])    skin  . Chicken pox    SHINGLES TWICE  . Colon polyps   . Depression   . Diverticulitis   . GERD (gastroesophageal reflux disease)   . Hay  fever   . Heart disease   . Heart murmur   . Hyperlipidemia   . UTI (urinary tract infection)   . Varicose veins of both lower extremities     Past Surgical History:  Procedure Laterality Date  . HERNIA REPAIR    . KNEE SURGERY    . ORIF ANKLE FRACTURE Right 05/22/2018   Procedure: OPEN REDUCTION INTERNAL FIXATION (ORIF) TRIMALLEOLAR  ANKLE FRACTURE;  Surgeon: Leim Fabry, MD;  Location: [facility] ORS;  Service: Orthopedics;  Laterality: Right;    Family History  Problem Relation Age of Onset  . Stroke Mother   . Heart attack Father    Social History   Tobacco Use  . Smoking status: Never Smoker  . Smokeless tobacco: Never Used  Substance Use Topics  . Alcohol use: No    Alcohol/week: 0.0 standard drinks    Current Outpatient Medications:  .  acetaminophen (TYLENOL) 650 MG CR tablet, Take 650 mg by mouth every 8 (eight) hours as needed for pain., Disp: , Rfl:  .  alum & mag hydroxide-simeth (MAALOX PLUS) 400-400-40 MG/5ML suspension, Take 30 mLs by mouth every 4 (four) hours as needed for indigestion. Ok to use facility stock, Disp: , Rfl:  .  bisacodyl (DULCOLAX) 10 MG suppository, Place 10 mg rectally daily as needed for moderate constipation., Disp: , Rfl:  .  bisacodyl (DULCOLAX) 5 MG EC tablet, Take 5 mg by mouth 3 (three) times daily., Disp: , Rfl:  .  buPROPion (WELLBUTRIN SR) 150 MG 12 hr tablet, TAKE ONE TABLET TWICE DAILY, Disp: 60 tablet, Rfl: 2 .  Calcium Carb-Cholecalciferol 600-800 MG-UNIT TABS, Take 1 tablet by mouth daily. , Disp: , Rfl:  .  calcium carbonate (TUMS - DOSED IN MG ELEMENTAL CALCIUM) 500 MG chewable tablet, Chew 1-2 tablets by mouth every 4 (four) hours as needed for indigestion or heartburn. indigestion/ abd pain (1- mild; 2- moderate sx), Disp: , Rfl:  .  Cholecalciferol 4000 units CAPS, Take 4,000 Units by mouth daily., Disp: , Rfl:  .  cloNIDine (CATAPRES) 0.1 MG tablet, TAKE ONE TABLET BY MOUTH TWICE DAILY, Disp: 60 tablet, Rfl: 3 .  diltiazem  (CARDIZEM) 30 MG tablet, TAKE ONE TABLET 3 TIMES DAILY, Disp: 270 tablet, Rfl: 0 .  fexofenadine (ALLEGRA) 180 MG tablet, Take 180 mg by mouth daily as needed for allergies or rhinitis., Disp: , Rfl:  .  hydrochlorothiazide (HYDRODIURIL) 12.5 MG tablet, TAKE 1 TABLET BY MOUTH DAILY WITH LOSARTAN 50 MG, Disp: 30 tablet, Rfl: 2 .  hydrocortisone cream 1 %, Apply 1 application topically 2 (two) times daily., Disp: , Rfl:  .  losartan (COZAAR) 50 MG tablet, TAKE 1 TABLET BY MOUTH DAILY WITH HYDROCHLOROTHIAZIDE 12.5 MG, Disp: 30 tablet, Rfl: 2 .  losartan-hydrochlorothiazide (HYZAAR) 50-12.5 MG tablet, TAKE ONE TABLET BY MOUTH EVERY DAY, Disp: 30 tablet, Rfl: 2 .  Multiple Vitamins-Minerals (CENTRUM SILVER) tablet, Take 1 tablet by mouth daily. , Disp: , Rfl:  .  nitroGLYCERIN (NITRODUR - DOSED IN MG/24 HR) 0.4 mg/hr patch, APPLY 1 PATCH DAILY LEAVE ON 12 TO 14 HOURS AND REMOVE FOR 10 TO 12 HOURS BEFORE APPLYING NEXT PATCH, Disp: 30 patch, Rfl: 2 .  nitroGLYCERIN (NITROSTAT) 0.4 MG SL tablet, APPLY 1 PATCH DAILY LEAVE ON 12 TO 14 HOURS AND REMOVE FOR 10 TO 12 HOURS BEFORE APPLYING NEXT PATCH, Disp: 30 tablet, Rfl: 0 .  Nutritional Supplements (NUTRITIONAL SUPPLEMENT PO), Diet Type: Regular, Disp: , Rfl:  .  Omega-3 Fatty Acids (FISH OIL) 1200 MG CAPS, Take 1 capsule by mouth daily. , Disp: , Rfl:  .  omeprazole (PRILOSEC) 20 MG capsule, Take 20 mg by mouth 2 (two) times daily., Disp: , Rfl:  .  phenylephrine (,USE FOR PREPARATION-H,) 0.25 % suppository, Place 1 suppository rectally every 4 (four) hours as needed for hemorrhoids. to relieve swelling/bleeding for hemorrhoid., Disp: , Rfl:  .  Polyethyl Glycol-Propyl Glycol 0.4-0.3 % SOLN, Place 1 drop into both eyes as needed (dry eyes). , Disp: , Rfl:  .  potassium chloride (K-DUR) 10 MEQ tablet, TAKE 1 TABLET BY MOUTH DAILY, Disp: 90 tablet, Rfl: 1 .  potassium chloride (K-DUR) 10 MEQ tablet, TAKE 1 TABLET BY MOUTH DAILY, Disp: 90 tablet, Rfl: 0 .   sertraline (ZOLOFT) 100 MG tablet, TAKE 2 TABLETS BY MOUTH AT BEDTIME, Disp: 60 tablet, Rfl: 3 .  traMADol (ULTRAM) 50 MG tablet, TAKE 1 TABLET BY MOUTH EVERY 6 HOURS AS NEEDED FOR MODERATE PAIN, Disp: 20 tablet, Rfl: 0 .  vitamin C (ASCORBIC ACID) 500 MG tablet, Take 500 mg by mouth at bedtime., Disp: , Rfl:   EXAM:  GENERAL: alert, oriented, sounds well and in no acute distress  LUNGS: speaking in full sentences, no signs of respiratory distress, breathing rate appears normal, no obvious gross SOB, gasping or wheezing  PSYCH/NEURO: pleasant and cooperative, no obvious depression or anxiety, speech and thought processing grossly intact  ASSESSMENT AND PLAN:  Discussed the following assessment and plan:  UTI without hematuria- patient symptoms do sound consistent with a UTI and may also have confirmation of UTI from urinalysis and urine culture.  We were able to track down these results after Effexor was sent to our office from the [facility].  Sensitivity report was obtained and Keflex was chosen to treat UTI.  Patient advised to increase water intake, avoid excess sugary or caffeinated beverages, wear cotton underwear and always wipe front to back after using restroom.  Also discussed good handwashing.   I discussed the assessment and treatment plan with the patient. The patient was provided an opportunity to ask questions and all were answered. The patient agreed with the plan and demonstrated an understanding of the instructions.   The patient was advised to call back or seek an in-person evaluation if the symptoms worsen or if the condition fails to improve as anticipated.  I provided 12 minutes of non-face-to-face time during this encounter.   Jodelle Green, FNP

## 2019-04-11 NOTE — Telephone Encounter (Signed)
Fax received  Please call patient to let her know I sent in keflex for her to treat UTI. I tried to call her as she requested, but was unable to get through  Thanks!  LG

## 2019-04-23 ENCOUNTER — Other Ambulatory Visit: Payer: Self-pay | Admitting: Family Medicine

## 2019-05-18 ENCOUNTER — Other Ambulatory Visit: Payer: Self-pay | Admitting: Family Medicine

## 2019-05-25 ENCOUNTER — Other Ambulatory Visit: Payer: Self-pay | Admitting: Family Medicine

## 2019-06-08 ENCOUNTER — Other Ambulatory Visit: Payer: Self-pay | Admitting: Family Medicine

## 2019-07-03 ENCOUNTER — Other Ambulatory Visit: Payer: Self-pay | Admitting: Family Medicine

## 2019-07-04 ENCOUNTER — Telehealth: Payer: Self-pay | Admitting: *Deleted

## 2019-07-04 NOTE — Telephone Encounter (Signed)
Copied from [place] [id number]. Topic: Quick Communication - Rx Refill/Question >> Jul 04, 2019 10:04 AM Carolyn Stare wrote: Medication  t req refill   buPROPion (WELLBUTRIN SR) 150 MG 12 hr tablet    [facility]acy   [facility]   Agent: Please be advised that RX refills may take up to 3 business days. We ask that you follow-up with your pharmacy.

## 2019-07-04 NOTE — Telephone Encounter (Signed)
Patient last OV with PCP 11/19 last OV in clinic 6/20 ok to fill ?

## 2019-07-05 MED ORDER — BUPROPION HCL ER (SR) 150 MG PO TB12: 150.0000 mg | ORAL_TABLET | Freq: Two times a day (BID) | ORAL | 0 refills | Status: DC

## 2019-07-05 NOTE — Telephone Encounter (Signed)
She needs follow-up scheduled. I will refill once, though she needs to be seen in the next 1-2 months.

## 2019-07-10 NOTE — Telephone Encounter (Signed)
Patient needs medication follow up appointment.

## 2019-07-12 NOTE — Telephone Encounter (Signed)
I called pt and left message to call ofc to schedule medication appt.

## 2019-07-26 ENCOUNTER — Other Ambulatory Visit: Payer: Self-pay

## 2019-07-27 ENCOUNTER — Other Ambulatory Visit: Payer: Self-pay

## 2019-07-27 ENCOUNTER — Ambulatory Visit (INDEPENDENT_AMBULATORY_CARE_PROVIDER_SITE_OTHER): Payer: Medicare Other | Admitting: Family Medicine

## 2019-07-27 ENCOUNTER — Encounter: Payer: Self-pay | Admitting: Family Medicine

## 2019-07-27 VITALS — BP 130/80 | HR 90 | Temp 97.5°F | Wt 144.0 lb

## 2019-07-27 DIAGNOSIS — R300 Dysuria: Secondary | ICD-10-CM | POA: Diagnosis not present

## 2019-07-27 DIAGNOSIS — S82891S Other fracture of right lower leg, sequela: Secondary | ICD-10-CM | POA: Diagnosis not present

## 2019-07-27 DIAGNOSIS — Z23 Encounter for immunization: Secondary | ICD-10-CM | POA: Diagnosis not present

## 2019-07-27 DIAGNOSIS — I10 Essential (primary) hypertension: Secondary | ICD-10-CM | POA: Diagnosis not present

## 2019-07-27 DIAGNOSIS — F339 Major depressive disorder, recurrent, unspecified: Secondary | ICD-10-CM

## 2019-07-27 DIAGNOSIS — I2510 Atherosclerotic heart disease of native coronary artery without angina pectoris: Secondary | ICD-10-CM

## 2019-07-27 DIAGNOSIS — K439 Ventral hernia without obstruction or gangrene: Secondary | ICD-10-CM

## 2019-07-27 DIAGNOSIS — H9193 Unspecified hearing loss, bilateral: Secondary | ICD-10-CM

## 2019-07-27 DIAGNOSIS — H9190 Unspecified hearing loss, unspecified ear: Secondary | ICD-10-CM | POA: Insufficient documentation

## 2019-07-27 LAB — COMPREHENSIVE METABOLIC PANEL
ALT: 11 U/L (ref 0–35)
AST: 17 U/L (ref 0–37)
Albumin: 4.5 g/dL (ref 3.5–5.2)
Alkaline Phosphatase: 73 U/L (ref 39–117)
BUN: 22 mg/dL (ref 6–23)
CO2: 25 mEq/L (ref 19–32)
Calcium: 9.8 mg/dL (ref 8.4–10.5)
Chloride: 104 mEq/L (ref 96–112)
Creatinine, Ser: 0.84 mg/dL (ref 0.40–1.20)
GFR: 65.03 mL/min (ref >60.00)
Glucose, Bld: 96 mg/dL (ref 70–99)
Potassium: 3.8 mEq/L (ref 3.5–5.1)
Sodium: 140 mEq/L (ref 135–145)
Total Bilirubin: 0.7 mg/dL (ref 0.2–1.2)
Total Protein: 7 g/dL (ref 6.0–8.3)

## 2019-07-27 LAB — LIPID PANEL
Cholesterol: 291 mg/dL — ABNORMAL HIGH (ref 0–200)
HDL: 53.2 mg/dL (ref >39.00)
NonHDL: 237.35
Total CHOL/HDL Ratio: 5
Triglycerides: 259 mg/dL — ABNORMAL HIGH (ref 0.0–149.0)
VLDL: 51.8 mg/dL — ABNORMAL HIGH (ref 0.0–40.0)

## 2019-07-27 LAB — POCT URINALYSIS DIPSTICK
Bilirubin, UA: NEGATIVE
Blood, UA: NEGATIVE
Glucose, UA: NEGATIVE
Ketones, UA: NEGATIVE
Nitrite, UA: NEGATIVE
Protein, UA: NEGATIVE
Spec Grav, UA: 1.015 (ref 1.010–1.025)
Urobilinogen, UA: 0.2 E.U./dL
pH, UA: 5.5 (ref 5.0–8.0)

## 2019-07-27 LAB — LDL CHOLESTEROL, DIRECT: Direct LDL: 195 mg/dL

## 2019-07-27 NOTE — Assessment & Plan Note (Signed)
Checking urinalysis.

## 2019-07-27 NOTE — Progress Notes (Signed)
Cindy Rumps, MD Phone: [phone]  Cindy Fitzpatrick is a 83 y.o. female who presents today for follow-up.  Dysuria: Patient notes pain with urination once.  She notes some increased frequency.  Some urgency.  No hematuria.  No vaginal discharge.  Hypertension: Not checking blood pressures at home.  She is taking losartan, HCTZ, clonidine, and diltiazem.  No chest pain, shortness breath, or edema.  Depression: She denies depressive symptoms.  She is on Zoloft and Wellbutrin.  Fall: She fell a couple of months ago with no significant injury.  She notes the people cleaning her apartment put something on the floor that was slick and she slipped.  She does walk with a walker.  There are no area rugs.  Decreased hearing: Patient is without 1 of her hearing aids.  She has had trouble hearing since she has only had one hearing aid.  Right ankle fracture: This is healed well.  She does still have a mild scab.  She reports she saw the orthopedist prior to [place] and they advised that it was not draining enough to do anything about it.  We are oozing though no purulent discharge.  Abdominal hernia: Patient has a chronic history of this.  She feels as though it may be a little worse than previously as she does have some discomfort at times.  She does note it pretty much fully reduces when she lays down.  No significant pain.  She does have bowel movements every couple of days.  She passes gas frequently.  She does not want this repaired.  Social History   Tobacco Use  Smoking Status Never Smoker  Smokeless Tobacco Never Used     ROS see history of present illness  Objective  Physical Exam Vitals:   07/27/19 1041 07/27/19 1056  BP: (!) 150/90 130/80  Pulse: 90   Temp: (!) 97.5 F (36.4 C)   SpO2: 97%     BP Readings from Last 3 Encounters:  07/27/19 130/80  09/13/18 118/72  08/08/18 (!) 140/50   Wt Readings from Last 3 Encounters:  07/27/19 144 lb (65.3 kg)  09/13/18 143 lb  9.6 oz (65.1 kg)  08/08/18 146 lb 11.2 oz (66.5 kg)    Physical Exam Constitutional:      General: She is not in acute distress.    Appearance: She is not diaphoretic.  Cardiovascular:     Rate and Rhythm: Normal rate and regular rhythm.     Heart sounds: Murmur (2/6 systolic murmur) present.  Pulmonary:     Effort: Pulmonary effort is normal.     Breath sounds: Normal breath sounds.  Abdominal:     General: There is no distension.     Palpations: Abdomen is soft.     Tenderness: There is no abdominal tenderness (Mild discomfort on palpation of the hernia though no significant tenderness or pain).     Hernia: A hernia (ventral, no tenderness) is present.  Musculoskeletal:       Feet:  Skin:    General: Skin is warm and dry.  Neurological:     Mental Status: She is alert.      Assessment/Plan: Please see individual problem list.  Benign essential HTN Well-controlled on recheck.  Continue current medication.  Check lab work.  CAD in native artery Check lipid panel.  Ankle fracture Well-healed.  She does still have some minimal scabbing.  She will continue to monitor.  Depression Adequately controlled on current medication.  Ventral hernia Mild discomfort at  times.  Discussed referral to general surgery to discuss options for treatment though the patient does not want this repaired and thus I do not think she needs to see general surgery.  Discussed reasons that she needs to seek medical attention in the emergency room for regarding the hernia.  Dysuria Checking urinalysis.  Decreased hearing Patient has hearing aids.  Discussed getting her nonworking hearing aid fixed.   Health Maintenance: Flu vaccine given today.  Orders Placed This Encounter  Procedures  . Flu Vaccine QUAD High Dose(Fluad)  . Comp Met (CMET)  . Lipid panel  . POCT Urinalysis Dipstick    No orders of the defined types were placed in this encounter.    Cindy Rumps, MD [place]

## 2019-07-27 NOTE — Patient Instructions (Signed)
Nice to see you. We will check your urine. We will get lab work today. Please get your hearing aid fixed. Please monitor your ankle and if the scab does not resolve over the next month or so please let us know.

## 2019-07-27 NOTE — Assessment & Plan Note (Signed)
Check lipid panel  

## 2019-07-27 NOTE — Assessment & Plan Note (Signed)
Well-controlled on recheck.  Continue current medication.  Check lab work.

## 2019-07-27 NOTE — Assessment & Plan Note (Signed)
Patient has hearing aids.  Discussed getting her nonworking hearing aid fixed.

## 2019-07-27 NOTE — Assessment & Plan Note (Signed)
Mild discomfort at times.  Discussed referral to general surgery to discuss options for treatment though the patient does not want this repaired and thus I do not think she needs to see general surgery.  Discussed reasons that she needs to seek medical attention in the emergency room for regarding the hernia.

## 2019-07-27 NOTE — Assessment & Plan Note (Signed)
Well-healed.  She does still have some minimal scabbing.  She will continue to monitor.

## 2019-07-27 NOTE — Assessment & Plan Note (Signed)
Adequately controlled on current medication.

## 2019-07-30 ENCOUNTER — Telehealth: Payer: Self-pay | Admitting: Family Medicine

## 2019-07-30 NOTE — Telephone Encounter (Signed)
Pt son would like results from labs on 10/02. Please advise and Thank you!

## 2019-07-30 NOTE — Telephone Encounter (Signed)
Noted  

## 2019-08-03 ENCOUNTER — Other Ambulatory Visit: Payer: Self-pay | Admitting: Family Medicine

## 2019-08-11 ENCOUNTER — Other Ambulatory Visit: Payer: Self-pay | Admitting: Family Medicine

## 2019-08-25 ENCOUNTER — Other Ambulatory Visit: Payer: Self-pay | Admitting: Family Medicine

## 2019-08-29 ENCOUNTER — Other Ambulatory Visit: Payer: Self-pay | Admitting: Family Medicine

## 2019-08-29 NOTE — Telephone Encounter (Signed)
Pt's son called to go over pt's urinalysis results. He stated he did not get a full report. Please advise. CB#[phone]

## 2019-08-30 NOTE — Telephone Encounter (Signed)
Patient's son calling back about request. Please advise.

## 2019-08-31 ENCOUNTER — Other Ambulatory Visit: Payer: Self-pay | Admitting: Family Medicine

## 2019-09-03 ENCOUNTER — Ambulatory Visit (INDEPENDENT_AMBULATORY_CARE_PROVIDER_SITE_OTHER): Payer: Medicare Other | Admitting: Family Medicine

## 2019-09-03 ENCOUNTER — Encounter: Payer: Self-pay | Admitting: Family Medicine

## 2019-09-03 ENCOUNTER — Other Ambulatory Visit: Payer: Self-pay

## 2019-09-03 VITALS — BP 130/62 | HR 92 | Temp 96.0°F | Wt 144.4 lb

## 2019-09-03 DIAGNOSIS — N390 Urinary tract infection, site not specified: Secondary | ICD-10-CM

## 2019-09-03 DIAGNOSIS — R300 Dysuria: Secondary | ICD-10-CM

## 2019-09-03 DIAGNOSIS — R319 Hematuria, unspecified: Secondary | ICD-10-CM

## 2019-09-03 LAB — POCT URINALYSIS DIPSTICK
Bilirubin, UA: NEGATIVE
Glucose, UA: NEGATIVE
Ketones, UA: NEGATIVE
Nitrite, UA: NEGATIVE
Protein, UA: POSITIVE — AB
Spec Grav, UA: 1.015 (ref 1.010–1.025)
Urobilinogen, UA: 0.2 E.U./dL
pH, UA: 6 (ref 5.0–8.0)

## 2019-09-03 MED ORDER — CEPHALEXIN 500 MG PO CAPS: 500.0000 mg | ORAL_CAPSULE | Freq: Two times a day (BID) | ORAL | 0 refills | Status: DC

## 2019-09-03 NOTE — Progress Notes (Signed)
Subjective:    Patient ID: Cindy Fitzpatrick, female    DOB: Nov 27, 1935, 83 y.o.   MRN: [id number]  HPI  Patient Active Problem List   Diagnosis Date Noted  . Decreased hearing 07/27/2019  . Depression 08/24/2018  . Hypokalemia 06/13/2018  . Ankle fracture 05/22/2018  . Chronic constipation 04/19/2018  . Anxiety disorder 04/19/2018  . Traumatic hematoma of left lower leg 02/17/2018  . Blister 11/30/2017  . Dysuria 06/03/2017  . Dark stools 08/26/2016  . Diverticulitis of small intestine without perforation or abscess with bleeding   . Ischemic chest pain ([place])   . History of coronary artery stent placement   . Heart murmur 04/05/2016  . Ventral hernia 03/15/2016  . Osteopenia 01/21/2016  . Fall at home 12/25/2015  . Chronic pain syndrome 02/13/2015  . Fatigue 02/13/2015  . Obesity (BMI 30-39.9) 12/01/2014  . Bipolar affective disorder ([place]) 03/25/2014  . Rheumatoid arthritis ([place]) 03/25/2014  . Chronic anemia 03/25/2014  . CAD in native artery 03/25/2014  . GERD without esophagitis 03/25/2014  . Benign essential HTN 03/25/2014  . HLD (hyperlipidemia) 03/25/2014   Social History   Tobacco Use  . Smoking status: Never Smoker  . Smokeless tobacco: Never Used  Substance Use Topics  . Alcohol use: No    Alcohol/week: 0.0 standard drinks    Review of Systems  Constitutional: Negative for chills, fatigue and fever.  HENT: Negative for congestion, ear pain, sinus pain and sore throat.   Eyes: Negative.   Respiratory: Negative for cough, shortness of breath and wheezing.   Cardiovascular: Negative for chest pain, palpitations and leg swelling.  Gastrointestinal: Negative for abdominal pain, diarrhea, nausea and vomiting.  Genitourinary: +dysuria, frequency and urgency.  Musculoskeletal: Negative for arthralgias and myalgias.  Skin: Negative for color change, pallor and rash.  Neurological: Negative for syncope, light-headedness and headaches.  Psychiatric/Behavioral:  The patient is not nervous/anxious.       Objective:   Physical Exam Vitals signs and nursing note reviewed.  Constitutional:      General: She is not in acute distress.    Appearance: She is well-developed. She is not toxic-appearing.     Comments: Frail elderly woman in wheelchair  HENT:     Head: Normocephalic and atraumatic.  Eyes:     General: No scleral icterus.    Extraocular Movements: Extraocular movements intact.     Conjunctiva/sclera: Conjunctivae normal.  Neck:     Musculoskeletal: Neck supple.     Trachea: No tracheal deviation.  Cardiovascular:     Rate and Rhythm: Normal rate and regular rhythm.     Heart sounds: Normal heart sounds.  Pulmonary:     Effort: Pulmonary effort is normal. No respiratory distress.     Breath sounds: Normal breath sounds.  Abdominal:     General: Bowel sounds are normal. There is no distension.     Palpations: Abdomen is soft. There is no mass.     Tenderness: There is abdominal tenderness (mild left sided suprapubic tenderness). There is no right CVA tenderness, left CVA tenderness, guarding or rebound.  Skin:    General: Skin is warm and dry.     Coloration: Skin is not jaundiced or pale.  Neurological:     Mental Status: She is alert and oriented to person, place, and time.  Psychiatric:        Mood and Affect: Mood normal.        Behavior: Behavior normal.  Today's Vitals   09/03/19 0934  BP: 130/62  Pulse: 92  Temp: (!) 96 F (35.6 C)  TempSrc: Temporal  SpO2: 98%  Weight: 144 lb 6.4 oz (65.5 kg)   Body mass index is 25.58 kg/m.     Assessment & Plan:    UTI-patient's urinalysis is positive for UTI.  We will treat with Keflex twice daily for 5 days.  We will send urine for culture to be sure we are on appropriate treatment.  She will increase water intake and avoid excess sugar and caffeine in beverages.  Patient will otherwise keep all regular follow-ups with PCP as planned.  She will call office  anytime with questions or concerns.

## 2019-09-03 NOTE — Patient Instructions (Addendum)
Urinary Tract Infection, Adult A urinary tract infection (UTI) is an infection of any part of the urinary tract. The urinary tract includes:  The kidneys.  The ureters.  The bladder.  The urethra. These organs make, store, and get rid of pee (urine) in the body. What are the causes? This is caused by germs (bacteria) in your genital area. These germs grow and cause swelling (inflammation) of your urinary tract. What increases the risk? You are more likely to develop this condition if:  You have a small, thin tube (catheter) to drain pee.  You cannot control when you pee or poop (incontinence).  You are female, and: ? You use these methods to prevent pregnancy: ? A medicine that kills sperm (spermicide). ? A device that blocks sperm (diaphragm). ? You have low levels of a female hormone (estrogen). ? You are pregnant.  You have genes that add to your risk.  You are sexually active.  You take antibiotic medicines.  You have trouble peeing because of: ? A prostate that is bigger than normal, if you are female. ? A blockage in the part of your body that drains pee from the bladder (urethra). ? A kidney stone. ? A nerve condition that affects your bladder (neurogenic bladder). ? Not getting enough to drink. ? Not peeing often enough.  You have other conditions, such as: ? Diabetes. ? A weak disease-fighting system (immune system). ? Sickle cell disease. ? Gout. ? Injury of the spine. What are the signs or symptoms? Symptoms of this condition include:  Needing to pee right away (urgently).  Peeing often.  Peeing small amounts often.  Pain or burning when peeing.  Blood in the pee.  Pee that smells bad or not like normal.  Trouble peeing.  Pee that is cloudy.  Fluid coming from the vagina, if you are female.  Pain in the belly or lower back. Other symptoms include:  Throwing up (vomiting).  No urge to eat.  Feeling mixed up (confused).  Being tired  and grouchy (irritable).  A fever.  Watery poop (diarrhea). How is this treated? This condition may be treated with:  Antibiotic medicine.  Other medicines.  Drinking enough water. Follow these instructions at home:  Medicines  Take over-the-counter and prescription medicines only as told by your doctor.  If you were prescribed an antibiotic medicine, take it as told by your doctor. Do not stop taking it even if you start to feel better. General instructions  Make sure you: ? Pee until your bladder is empty. ? Do not hold pee for a long time. ? Empty your bladder after sex. ? Wipe from front to back after pooping if you are a female. Use each tissue one time when you wipe.  Drink enough fluid to keep your pee pale yellow.  Keep all follow-up visits as told by your doctor. This is important. Contact a doctor if:  You do not get better after 1-2 days.  Your symptoms go away and then come back. Get help right away if:  You have very bad back pain.  You have very bad pain in your lower belly.  You have a fever.  You are sick to your stomach (nauseous).  You are throwing up. Summary  A urinary tract infection (UTI) is an infection of any part of the urinary tract.  This condition is caused by germs in your genital area.  There are many risk factors for a UTI. These include having a small, thin   tube to drain pee and not being able to control when you pee or poop.  Treatment includes antibiotic medicines for germs.  Drink enough fluid to keep your pee pale yellow. This information is not intended to replace advice given to you by your health care provider. Make sure you discuss any questions you have with your health care provider. Document Released: 03/29/2008 Document Revised: 09/28/2018 Document Reviewed: 04/20/2018 Elsevier Patient Education  2020 Elsevier Inc.  

## 2019-09-04 LAB — URINE CULTURE
MICRO NUMBER:: 1078697
SPECIMEN QUALITY:: ADEQUATE

## 2019-09-17 ENCOUNTER — Other Ambulatory Visit: Payer: Self-pay | Admitting: Family Medicine

## 2019-09-19 ENCOUNTER — Other Ambulatory Visit: Payer: Self-pay | Admitting: Family Medicine

## 2019-09-27 NOTE — Telephone Encounter (Signed)
This has already been resolved with visit with visit with Philis Nettle, FNP.

## 2019-10-05 ENCOUNTER — Other Ambulatory Visit: Payer: Self-pay | Admitting: Family Medicine

## 2019-10-06 ENCOUNTER — Other Ambulatory Visit: Payer: Self-pay | Admitting: Family Medicine

## 2019-10-16 ENCOUNTER — Other Ambulatory Visit: Payer: Self-pay | Admitting: Family Medicine

## 2019-10-18 ENCOUNTER — Other Ambulatory Visit: Payer: Self-pay | Admitting: Family Medicine

## 2019-11-07 ENCOUNTER — Other Ambulatory Visit: Payer: Self-pay | Admitting: Family Medicine

## 2019-12-15 ENCOUNTER — Other Ambulatory Visit: Payer: Self-pay | Admitting: Family Medicine

## 2019-12-17 NOTE — Telephone Encounter (Signed)
Refill request for Potassium, last seen 07-27-19, last filled 09-19-19.  Please advise. Potassium level 3.8

## 2019-12-25 ENCOUNTER — Other Ambulatory Visit: Payer: Self-pay | Admitting: Internal Medicine

## 2020-01-07 ENCOUNTER — Telehealth: Payer: Self-pay

## 2020-01-09 NOTE — Telephone Encounter (Signed)
Noted  

## 2020-01-09 NOTE — Telephone Encounter (Signed)
The FL-2 that came in was signed by Dr. Derrel Nip and I faxed it to the [place] of [place] to [employer]. I called and informed bryan to look out for the fax.  Abbiegail Landgren,cma

## 2020-01-10 ENCOUNTER — Encounter: Admission: RE | Admit: 2020-01-10 | Payer: Medicare Other | Source: Ambulatory Visit | Admitting: Internal Medicine

## 2020-01-11 ENCOUNTER — Encounter
Admission: RE | Admit: 2020-01-11 | Discharge: 2020-01-11 | Disposition: A | Discharge status: Home / Self Care | Payer: Medicare Other | Source: Ambulatory Visit | Attending: Internal Medicine | Admitting: Internal Medicine

## 2020-01-11 ENCOUNTER — Telehealth: Payer: Self-pay | Admitting: Family Medicine

## 2020-01-11 NOTE — Telephone Encounter (Signed)
Please call Vickie regarding medication that is on the [facility] 2 Form for patient. Cardizem is showing as .1 and Epic says 30mg . She needs clarification.  [phone]

## 2020-01-11 NOTE — Telephone Encounter (Signed)
I called and lvm for vickie to call me back for clarification of medicine.  Reise Hietala,cma

## 2020-01-11 NOTE — Telephone Encounter (Signed)
I called and spoke with Vickie of [facility] and gave her medication information.  Simi Briel,cma

## 2020-01-16 ENCOUNTER — Other Ambulatory Visit
Admission: RE | Admit: 2020-01-16 | Discharge: 2020-01-16 | Disposition: A | Discharge status: Home / Self Care | Payer: Medicare Other | Source: Skilled Nursing Facility | Attending: Internal Medicine | Admitting: Internal Medicine

## 2020-01-16 DIAGNOSIS — R300 Dysuria: Secondary | ICD-10-CM | POA: Insufficient documentation

## 2020-01-16 LAB — URINALYSIS, ROUTINE W REFLEX MICROSCOPIC
Bilirubin Urine: NEGATIVE
Glucose, UA: NEGATIVE mg/dL
Hgb urine dipstick: NEGATIVE
Ketones, ur: NEGATIVE mg/dL
Nitrite: NEGATIVE
Protein, ur: NEGATIVE mg/dL
Specific Gravity, Urine: 1.01 (ref 1.005–1.030)
Squamous Epithelial / HPF: NONE SEEN (ref 0–5)
WBC, UA: >50 WBC/hpf — ABNORMAL HIGH (ref 0–5)
pH: 5 (ref 5.0–8.0)

## 2020-01-17 LAB — URINE CULTURE: Culture: NO GROWTH

## 2020-01-29 ENCOUNTER — Ambulatory Visit: Payer: Medicare Other | Admitting: Family Medicine

## 2020-02-18 ENCOUNTER — Emergency Department: Payer: Medicare Other

## 2020-02-18 ENCOUNTER — Encounter: Payer: Self-pay | Admitting: Medical Oncology

## 2020-02-18 ENCOUNTER — Other Ambulatory Visit: Payer: Self-pay

## 2020-02-18 ENCOUNTER — Emergency Department
Admission: EM | Admit: 2020-02-18 | Discharge: 2020-02-18 | Disposition: A | Discharge status: Home / Self Care | Payer: Medicare Other | Attending: Emergency Medicine | Admitting: Emergency Medicine

## 2020-02-18 DIAGNOSIS — Y92129 Unspecified place in nursing home as the place of occurrence of the external cause: Secondary | ICD-10-CM | POA: Insufficient documentation

## 2020-02-18 DIAGNOSIS — Y998 Other external cause status: Secondary | ICD-10-CM | POA: Diagnosis not present

## 2020-02-18 DIAGNOSIS — S0990XA Unspecified injury of head, initial encounter: Secondary | ICD-10-CM

## 2020-02-18 DIAGNOSIS — Y9389 Activity, other specified: Secondary | ICD-10-CM | POA: Insufficient documentation

## 2020-02-18 DIAGNOSIS — Z955 Presence of coronary angioplasty implant and graft: Secondary | ICD-10-CM | POA: Insufficient documentation

## 2020-02-18 DIAGNOSIS — M79642 Pain in left hand: Secondary | ICD-10-CM | POA: Insufficient documentation

## 2020-02-18 DIAGNOSIS — M25552 Pain in left hip: Secondary | ICD-10-CM | POA: Insufficient documentation

## 2020-02-18 DIAGNOSIS — W01198A Fall on same level from slipping, tripping and stumbling with subsequent striking against other object, initial encounter: Secondary | ICD-10-CM | POA: Insufficient documentation

## 2020-02-18 DIAGNOSIS — I2510 Atherosclerotic heart disease of native coronary artery without angina pectoris: Secondary | ICD-10-CM | POA: Diagnosis not present

## 2020-02-18 DIAGNOSIS — S0181XA Laceration without foreign body of other part of head, initial encounter: Secondary | ICD-10-CM | POA: Insufficient documentation

## 2020-02-18 DIAGNOSIS — Z85828 Personal history of other malignant neoplasm of skin: Secondary | ICD-10-CM | POA: Insufficient documentation

## 2020-02-18 MED ORDER — LIDOCAINE-EPINEPHRINE-TETRACAINE (LET) TOPICAL GEL
3.0000 mL | Freq: Once | TOPICAL | Status: AC
  Administered 2020-02-18: 3 mL via TOPICAL
  Filled 2020-02-18: qty 3

## 2020-02-18 MED ORDER — MORPHINE SULFATE (PF) 2 MG/ML IV SOLN
2.0000 mg | Freq: Once | INTRAVENOUS | Status: AC
  Administered 2020-02-18: 2 mg via INTRAMUSCULAR
  Filled 2020-02-18: qty 1

## 2020-02-18 NOTE — ED Provider Notes (Signed)
[facility] Emergency Department Provider Note ____________________________________________   First MD Initiated Contact with Patient 02/18/20 1017     (approximate)  I have reviewed the triage vital signs and the nursing notes.   HISTORY  Chief Complaint Fall and Laceration  HPI Cindy Fitzpatrick is a 84 y.o. female presenting to the emergency department via EMS from [place] after a nonsyncopal fall.  She states she leaned forward to pick something up and lost her balance and struck her head on the floor.  She she sustained laceration to the left side of her forehead.  She is also complaining of pain to her left hand and left hip. Tdap is current.      Past Medical History:  Diagnosis Date  . Arthritis   . Blood in stool   . Cancer ([place])    skin  . Chicken pox    SHINGLES TWICE  . Colon polyps   . Depression   . Diverticulitis   . GERD (gastroesophageal reflux disease)   . Hay fever   . Heart disease   . Heart murmur   . Hyperlipidemia   . UTI (urinary tract infection)   . Varicose veins of both lower extremities     Patient Active Problem List   Diagnosis Date Noted  . Decreased hearing 07/27/2019  . Depression 08/24/2018  . Hypokalemia 06/13/2018  . Ankle fracture 05/22/2018  . Chronic constipation 04/19/2018  . Anxiety disorder 04/19/2018  . Traumatic hematoma of left lower leg 02/17/2018  . Blister 11/30/2017  . Dysuria 06/03/2017  . Dark stools 08/26/2016  . Diverticulitis of small intestine without perforation or abscess with bleeding   . Ischemic chest pain ([place])   . History of coronary artery stent placement   . Heart murmur 04/05/2016  . Ventral hernia 03/15/2016  . Osteopenia 01/21/2016  . Fall at home 12/25/2015  . Chronic pain syndrome 02/13/2015  . Fatigue 02/13/2015  . Obesity (BMI 30-39.9) 12/01/2014  . Bipolar affective disorder ([place]) 03/25/2014  . Rheumatoid arthritis ([place]) 03/25/2014  . Chronic anemia  03/25/2014  . CAD in native artery 03/25/2014  . GERD without esophagitis 03/25/2014  . Benign essential HTN 03/25/2014  . HLD (hyperlipidemia) 03/25/2014    Past Surgical History:  Procedure Laterality Date  . HERNIA REPAIR    . KNEE SURGERY    . ORIF ANKLE FRACTURE Right 05/22/2018   Procedure: OPEN REDUCTION INTERNAL FIXATION (ORIF) TRIMALLEOLAR  ANKLE FRACTURE;  Surgeon: Leim Fabry, MD;  Location: [facility] ORS;  Service: Orthopedics;  Laterality: Right;      Allergies Codeine, Hydrocodone-acetaminophen, Meperidine, Niacin er, Other, Propoxyphene, Solifenacin, Statins, Sulfa antibiotics, Fish-derived products, Nabumetone, and Oxycodone-acetaminophen  Family History  Problem Relation Age of Onset  . Stroke Mother   . Heart attack Father     Social History Social History   Tobacco Use  . Smoking status: Never Smoker  . Smokeless tobacco: Never Used  Substance Use Topics  . Alcohol use: No    Alcohol/week: 0.0 standard drinks  . Drug use: No    Review of Systems  Constitutional: No fever/chills Eyes: No visual changes. ENT: No sore throat. Cardiovascular: Denies chest pain. Respiratory: Denies shortness of breath. Gastrointestinal: No abdominal pain.  No nausea, no vomiting.  No diarrhea.  No constipation. Genitourinary: Negative for dysuria. Musculoskeletal: Positive for left hip and left hand pain. Skin: Negative for rash. Neurological: Negative for headaches, focal weakness or numbness. ____________________________________________   PHYSICAL EXAM:  VITAL  SIGNS: ED Triage Vitals  Enc Vitals Group     BP 02/18/20 0907 (!) 171/86     Pulse Rate 02/18/20 0907 83     Resp 02/18/20 0907 16     Temp 02/18/20 0907 98.3 F (36.8 C)     Temp Source 02/18/20 0907 Oral     SpO2 02/18/20 0907 97 %     Weight 02/18/20 0910 143 lb 4.8 oz (65 kg)     Height 02/18/20 0910 5\' 3"  (1.6 m)     Head Circumference --      Peak Flow --      Pain Score 02/18/20 0910 8      Pain Loc --      Pain Edu? --      Excl. in [place]? --     Constitutional: Alert and oriented. Chronically ill appearing and in no acute distress. Eyes: Conjunctivae are normal. Head: Atraumatic. Nose: No congestion/rhinnorhea. Mouth/Throat: Mucous membranes are moist. Oropharynx non-erythematous. Neck: No stridor.   Hematological/Lymphatic/Immunilogical: No cervical lymphadenopathy. Cardiovascular: Normal rate, regular rhythm. Grossly normal heart sounds.  Good peripheral circulation. Respiratory: Normal respiratory effort.  No retractions. Lungs CTAB. Gastrointestinal: Soft and nontender. No distention. No abdominal bruits. No CVA tenderness. Genitourinary:  Musculoskeletal: Early ecchymosis noted to the left thumb over the thenar eminence and MCP.  No focal tenderness along the length of the spine.  No focal tenderness over the left hip but complains of pain with external rotation. Neurologic:  Normal speech and language. No gross focal neurologic deficits are appreciated. No gait instability. Skin:  Skin is warm, dry and intact. No rash noted. Psychiatric: Mood and affect are normal. Speech and behavior are normal.  ____________________________________________   LABS (all labs ordered are listed, but only abnormal results are displayed)  Labs Reviewed - No data to display ____________________________________________  EKG  Not indicated ____________________________________________  RADIOLOGY  ED MD interpretation:    Wounds of the left hand and left hip are negative for acute findings.  CT of the head and cervical spine are both negative for acute findings per radiology.  I, Sherrie George, personally viewed and evaluated these images (plain radiographs) as part of my medical decision making, as well as reviewing the written report by the radiologist.  Official radiology report(s): CT Head Wo Contrast  Result Date: 02/18/2020 CLINICAL DATA:  Fall, head trauma. EXAM: CT  HEAD WITHOUT CONTRAST CT CERVICAL SPINE WITHOUT CONTRAST TECHNIQUE: Multidetector CT imaging of the head and cervical spine was performed following the standard protocol without intravenous contrast. Multiplanar CT image reconstructions of the cervical spine were also generated. COMPARISON:  12/12/2015 CT head and lcervical spine. FINDINGS: CT HEAD FINDINGS Brain: Mild diffuse parenchymal volume loss with ex vacuo dilatation. Sequela of remote left occipital infarct. Scattered periventricular and deep white matter hypodense foci are nonspecific however commonly associated with chronic microvascular ischemic changes. No acute infarct. No midline shift or mass lesion. Vascular: No hyperdense vessel. Bilateral carotid siphon and V4 segment atherosclerotic calcifications. Skull: No acute fracture or focal lesion. Left frontal scalp hematoma. Sinuses/Orbits: Sequela of left lens replacement. Normal orbits. Clear paranasal sinuses. No mastoid effusion. Other: None. CT CERVICAL SPINE FINDINGS Alignment: Grade 1 C4-5 anterolisthesis of 3 mm (6:29). Minimal grade 1 C3-4 anterolisthesis. Skull base and vertebrae: No fracture.  No focal bone lesion. Soft tissues and spinal canal: No prevertebral fluid or swelling. No visible canal hematoma. Disc levels: Patent bony spinal canal. Multilevel degenerative changes including endplate sclerosis, osteophytosis, Schmorl's node  formation and severe disc space loss at the C4-T1 levels. Multilevel facet hypertrophy. Upper chest: Clear lung apices. Other: Calcified atheromatous plaque involving the bilateral carotid bifurcations, aortic arch and proximal left subclavian artery. IMPRESSION: No acute intracranial process.  Small left frontal scalp hematoma. Remote left occipital infarct, new since 2017. Mild diffuse cerebral atrophy and chronic microvascular ischemic changes. No cervical spine fracture or traumatic listhesis. Multilevel spondylosis with grade 1 C4-5 anterolisthesis of 3 mm,  unchanged. Electronically Signed   By: Primitivo Gauze M.D.   On: 02/18/2020 11:00   CT Cervical Spine Wo Contrast  Result Date: 02/18/2020 CLINICAL DATA:  Fall, head trauma. EXAM: CT HEAD WITHOUT CONTRAST CT CERVICAL SPINE WITHOUT CONTRAST TECHNIQUE: Multidetector CT imaging of the head and cervical spine was performed following the standard protocol without intravenous contrast. Multiplanar CT image reconstructions of the cervical spine were also generated. COMPARISON:  12/12/2015 CT head and lcervical spine. FINDINGS: CT HEAD FINDINGS Brain: Mild diffuse parenchymal volume loss with ex vacuo dilatation. Sequela of remote left occipital infarct. Scattered periventricular and deep white matter hypodense foci are nonspecific however commonly associated with chronic microvascular ischemic changes. No acute infarct. No midline shift or mass lesion. Vascular: No hyperdense vessel. Bilateral carotid siphon and V4 segment atherosclerotic calcifications. Skull: No acute fracture or focal lesion. Left frontal scalp hematoma. Sinuses/Orbits: Sequela of left lens replacement. Normal orbits. Clear paranasal sinuses. No mastoid effusion. Other: None. CT CERVICAL SPINE FINDINGS Alignment: Grade 1 C4-5 anterolisthesis of 3 mm (6:29). Minimal grade 1 C3-4 anterolisthesis. Skull base and vertebrae: No fracture.  No focal bone lesion. Soft tissues and spinal canal: No prevertebral fluid or swelling. No visible canal hematoma. Disc levels: Patent bony spinal canal. Multilevel degenerative changes including endplate sclerosis, osteophytosis, Schmorl's node formation and severe disc space loss at the C4-T1 levels. Multilevel facet hypertrophy. Upper chest: Clear lung apices. Other: Calcified atheromatous plaque involving the bilateral carotid bifurcations, aortic arch and proximal left subclavian artery. IMPRESSION: No acute intracranial process.  Small left frontal scalp hematoma. Remote left occipital infarct, new since  2017. Mild diffuse cerebral atrophy and chronic microvascular ischemic changes. No cervical spine fracture or traumatic listhesis. Multilevel spondylosis with grade 1 C4-5 anterolisthesis of 3 mm, unchanged. Electronically Signed   By: Primitivo Gauze M.D.   On: 02/18/2020 11:00   DG Hand Complete Left  Result Date: 02/18/2020 CLINICAL DATA:  Fall.  Hand pain EXAM: LEFT HAND - COMPLETE 3+ VIEW COMPARISON:  01/26/2010 FINDINGS: Negative for fracture or dislocation Degenerative change with joint space narrowing in the PIP and D IP joints. Mild degenerative change base of thumb. Moderate degenerative change in the radiocarpal joint with chondrocalcinosis in the triangular fibrocartilage. Degenerative changes have progressed since 2011. IMPRESSION: Negative for fracture. Electronically Signed   By: Franchot Gallo M.D.   On: 02/18/2020 11:56   DG Hip Unilat W or Wo Pelvis 2-3 Views Left  Result Date: 02/18/2020 CLINICAL DATA:  Fall.  Left hip pain EXAM: DG HIP (WITH OR WITHOUT PELVIS) 2-3V LEFT COMPARISON:  12/30/2012 FINDINGS: There is no evidence of hip fracture or dislocation. There is no evidence of arthropathy or other focal bone abnormality. IMPRESSION: Negative. Electronically Signed   By: Franchot Gallo M.D.   On: 02/18/2020 11:55    ____________________________________________   PROCEDURES  Procedure(s) performed (including Critical Care):  Marland KitchenMarland KitchenLaceration Repair  Date/Time: 02/18/2020 6:29 PM Performed by: Victorino Dike, FNP Authorized by: Victorino Dike, FNP   Consent:    Consent obtained:  Verbal   Consent given by:  Patient   Risks discussed:  Pain and poor cosmetic result Anesthesia (see MAR for exact dosages):    Anesthesia method:  Topical application   Topical anesthetic:  LET Laceration details:    Location: Forehead.   Length (cm):  4 Repair type:    Repair type:  Simple Pre-procedure details:    Preparation:  Patient was prepped and draped in usual sterile  fashion Exploration:    Hemostasis achieved with:  LET   Contaminated: no   Treatment:    Area cleansed with:  Betadine and saline   Amount of cleaning:  Standard   Irrigation method:  Tap Skin repair:    Repair method:  Sutures   Suture size:  6-0   Suture material:  Prolene   Number of sutures:  5 Approximation:    Approximation:  Close Post-procedure details:    Dressing:  Antibiotic ointment and adhesive bandage   Patient tolerance of procedure:  Tolerated well, no immediate complications    ____________________________________________   INITIAL IMPRESSION / ASSESSMENT AND PLAN     84 year old female presenting to the emergency department for treatment and evaluation after sustaining a mechanical, nonsyncopal fall prior to arrival.  See HPI for further details.  Plan will be to get images of her head and cervical spine as well as her left hip and hand.  DIFFERENTIAL DIAGNOSIS  Intracranial bleed, cervical spine injury, hip fracture, hand fracture, minor head injury, laceration.  ED COURSE  Images are all reassuring and do not demonstrate any acute findings.  Wound was cleaned and repaired as described above.  Patient tolerated procedure well.  Patient was able to stand and bear weight on the left hip to assist in transitioning from wheelchair to bed.  Patient will be discharged back to [place].  She is to have sutures removed in 5 days.  She is to follow-up with primary care or return to the emergency department for concerns. ____________________________________________   FINAL CLINICAL IMPRESSION(S) / ED DIAGNOSES  Final diagnoses:  Minor head injury, initial encounter  Laceration of forehead, initial encounter     ED Discharge Orders    None       Hermalinda Ertel Theard was evaluated in Emergency Department on 02/18/2020 for the symptoms described in the history of present illness. She was evaluated in the context of the global COVID-19 pandemic, which  necessitated consideration that the patient might be at risk for infection with the SARS-CoV-2 virus that causes COVID-19. Institutional protocols and algorithms that pertain to the evaluation of patients at risk for COVID-19 are in a state of rapid change based on information released by regulatory bodies including the CDC and federal and state organizations. These policies and algorithms were followed during the patient's care in the ED.   Note:  This document was prepared using Dragon voice recognition software and may include unintentional dictation errors.   Victorino Dike, FNP 02/18/20 Velta Addison    Harvest Dark, MD 02/19/20 1156

## 2020-02-18 NOTE — Discharge Instructions (Signed)
Follow up with primary care to have sutures removed in 5 days.  Return to the ER for symptoms of concern.

## 2020-02-18 NOTE — ED Notes (Signed)
See triage note  Presents s/p fall  States she leaned forward  Fell  Hitting forehead  Small laceration noted    Also having pain to left thumb and left knee

## 2020-02-18 NOTE — ED Triage Notes (Signed)
Pt from [place] via ems with reports of mechanical fall. Pt bent over to pick something up and tilted over onto forehead. Laceration noted to left side of forehead. Bleeding controlled. Pt denies LOC. Pt A/o x 4. No use of blood thinners.

## 2020-02-18 NOTE — ED Notes (Signed)
Report called to [place]

## 2020-02-25 ENCOUNTER — Encounter
Admission: RE | Admit: 2020-02-25 | Discharge: 2020-02-25 | Disposition: A | Discharge status: Home / Self Care | Payer: Medicare Other | Source: Ambulatory Visit | Attending: Internal Medicine | Admitting: Internal Medicine

## 2020-04-15 ENCOUNTER — Telehealth: Payer: Self-pay | Admitting: Internal Medicine

## 2020-04-15 NOTE — Telephone Encounter (Signed)
Copied from [place] [id number]. Topic: Medicare AWV >> Apr 15, 2020 11:31 AM Cher Nakai R wrote: Reason for CRM: Left message for patient to call back and schedule Medicare Annual Wellness Visit (AWV) either virtually or audio only.  No hx of AWV; please schedule at anytime with Denisa O'Brien-Blaney at [facility]

## 2020-05-12 ENCOUNTER — Encounter
Admission: RE | Admit: 2020-05-12 | Discharge: 2020-05-12 | Disposition: A | Discharge status: Home / Self Care | Payer: Medicare Other | Source: Ambulatory Visit | Attending: Internal Medicine | Admitting: Internal Medicine

## 2020-06-26 ENCOUNTER — Telehealth: Payer: Medicare Other | Admitting: Psychiatry

## 2020-06-26 IMAGING — CT CT CERVICAL SPINE W/O CM
3 of 4 series · 12 of 33 positions shown, 14 images · non-contrast
Comparison: 12/12/2015 CT head and lcervical spine.

CLINICAL DATA: Fall, head trauma.

EXAM:
CT HEAD WITHOUT CONTRAST
CT CERVICAL SPINE WITHOUT CONTRAST
TECHNIQUE: Multidetector CT imaging of the head and cervical spine was
performed following the standard protocol without intravenous
contrast. Multiplanar CT image reconstructions of the cervical spine
were also generated.

[Series 6: sagittal bone · sagittal · 0.23mm/px · 5 of 61 slices shown, 6 images]
[im 21/61  bone]
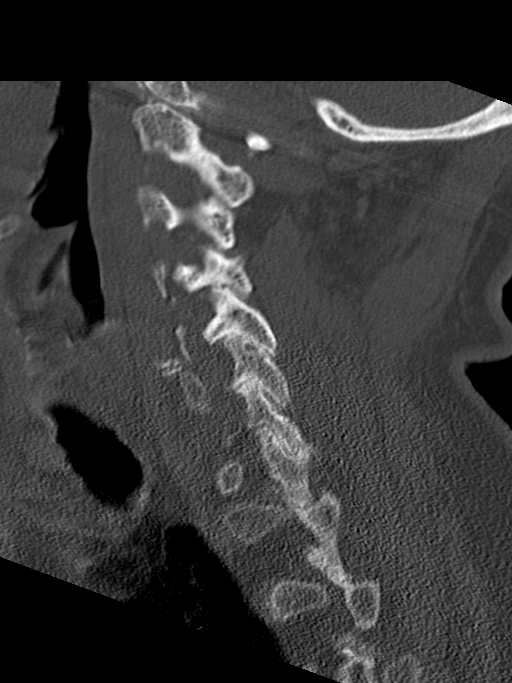
[im 26/61  bone]
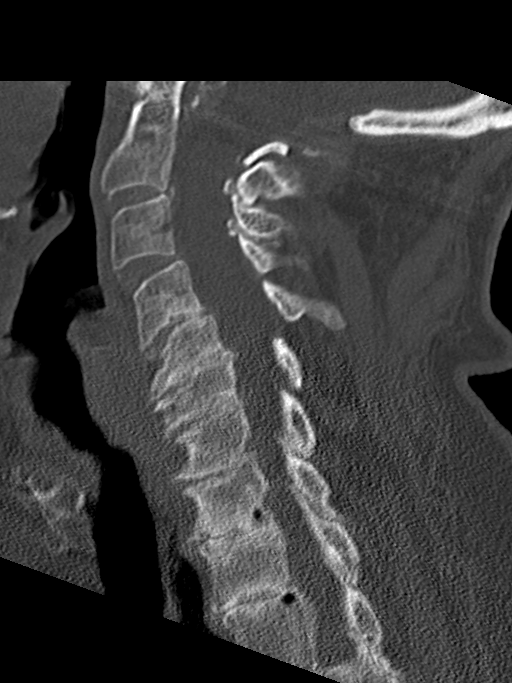
[im 31/61  soft-tissue]
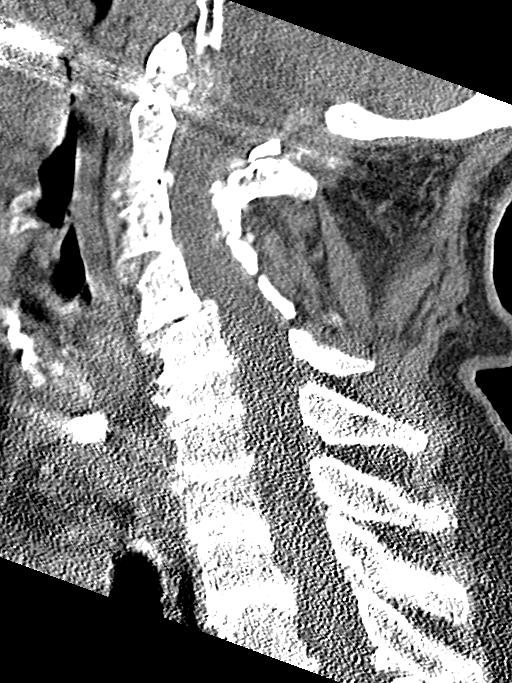
[im 31/61  bone]
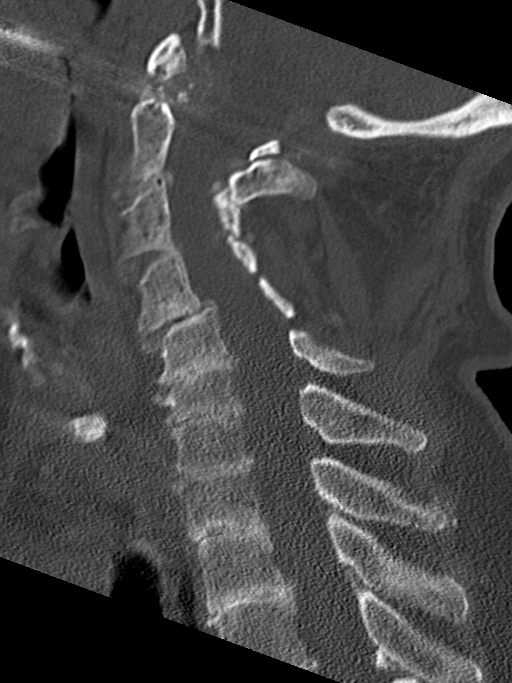
[im 36/61  bone]
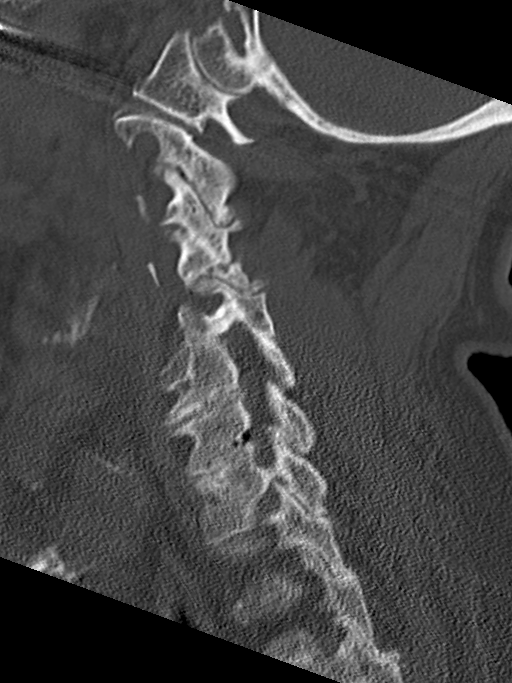
[im 41/61  bone]
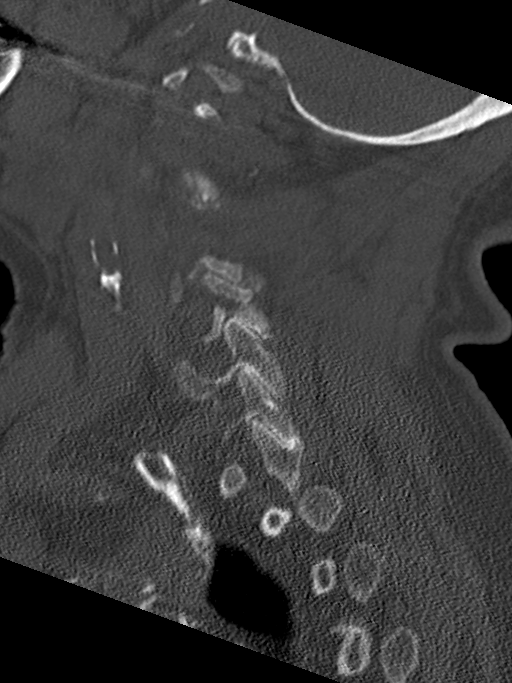

[Series 8: orthogonal bone · axial · 0.23mm/px · z∈[-344,-245]mm · 4 of 81 slices shown, 5 images]
[im 14/81  soft-tissue]
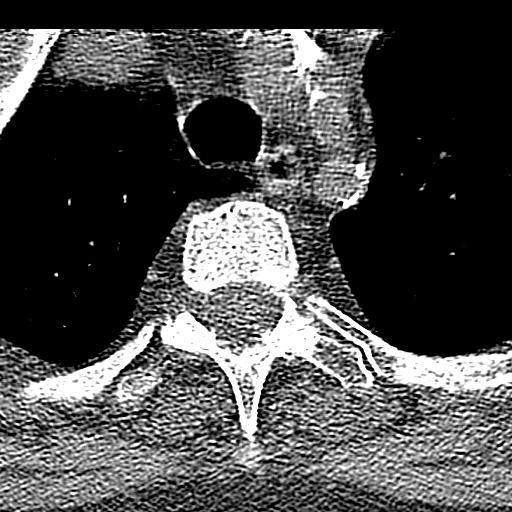
[im 14/81  bone]
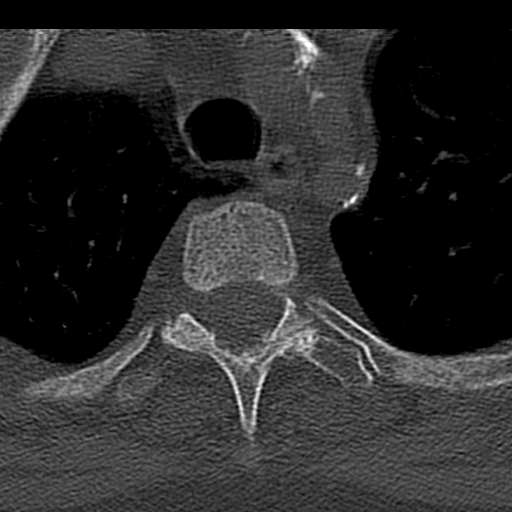
[im 27/81  bone]
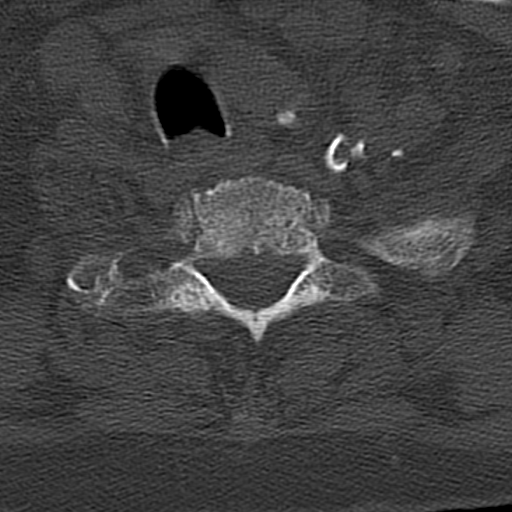
[im 54/81  bone]
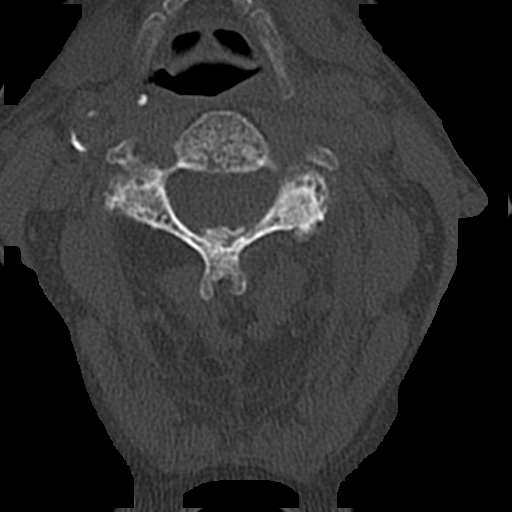
[im 67/81  bone]
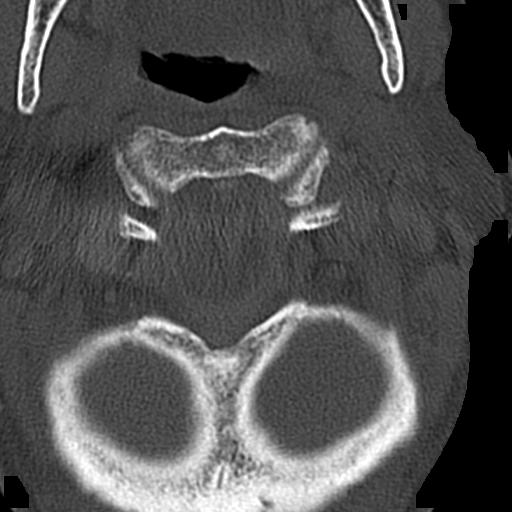

[Series 9: coronal bone · coronal · 0.25mm/px · 3 of 55 slices shown]
[im 14/55  bone]
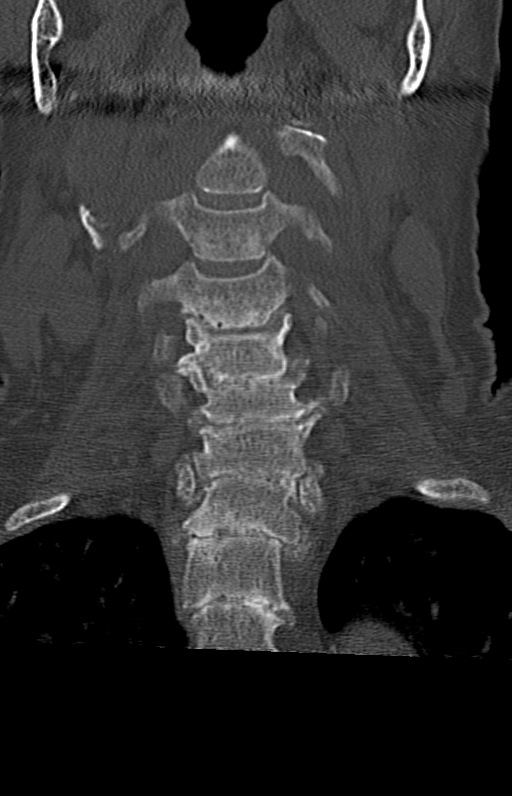
[im 23/55  bone]
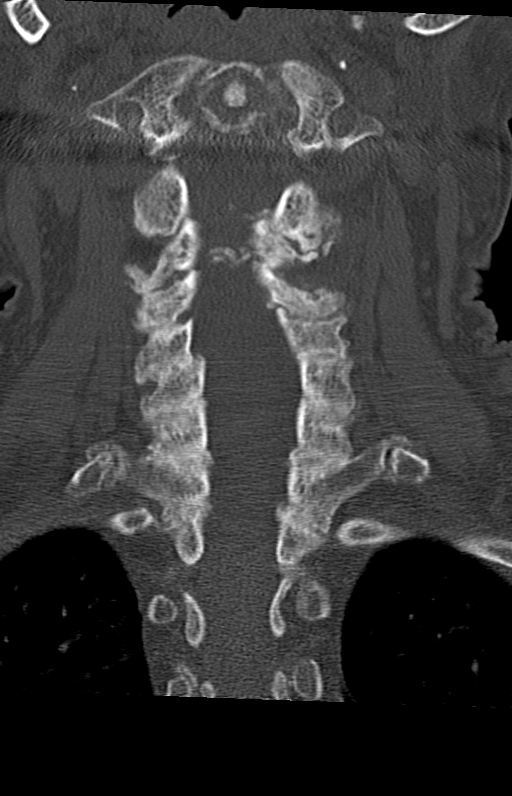
[im 32/55  bone]
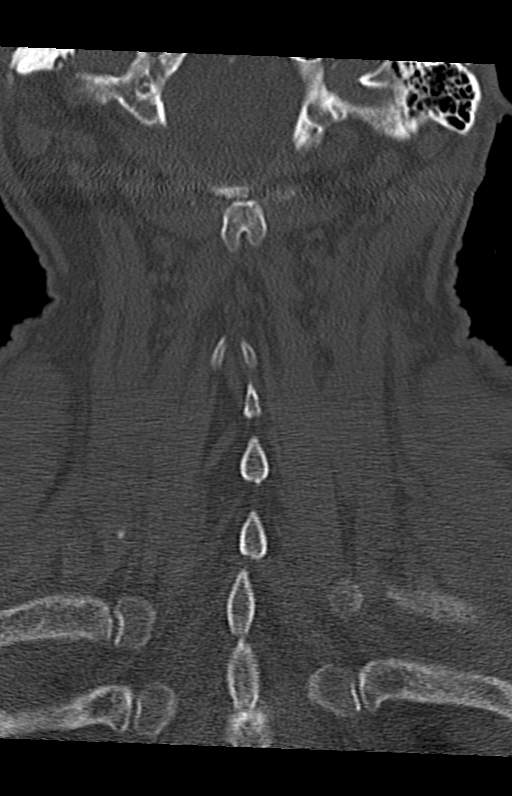

[12 of 33 positions shown; findings below may reference images not displayed]

FINDINGS: CT HEAD FINDINGS

Brain: Mild diffuse parenchymal volume loss with ex vacuo
dilatation. Sequela of remote left occipital infarct. Scattered
periventricular and deep white matter hypodense foci are nonspecific
however commonly associated with chronic microvascular ischemic
changes. No acute infarct. No midline shift or mass lesion.

Vascular: No hyperdense vessel. Bilateral carotid siphon and V4
segment atherosclerotic calcifications.

Skull: No acute fracture or focal lesion. Left frontal scalp
hematoma.

Sinuses/Orbits: Sequela of left lens replacement. Normal orbits.
Clear paranasal sinuses. No mastoid effusion.

Other: None.

CT CERVICAL SPINE FINDINGS

Alignment: Grade 1 C4-5 anterolisthesis of 3 mm ([DATE]). Minimal
grade 1 C3-4 anterolisthesis.

Skull base and vertebrae: No fracture.  No focal bone lesion.

Soft tissues and spinal canal: No prevertebral fluid or swelling. No
visible canal hematoma.

Disc levels: Patent bony spinal canal. Multilevel degenerative
changes including endplate sclerosis, osteophytosis, Schmorl's node
formation and severe disc space loss at the C4-T1 levels. Multilevel
facet hypertrophy.

Upper chest: Clear lung apices.

Other: Calcified atheromatous plaque involving the bilateral carotid
bifurcations, aortic arch and proximal left subclavian artery.
IMPRESSION: No acute intracranial process.  Small left frontal scalp hematoma.

Remote left occipital infarct, new since 2188.

Mild diffuse cerebral atrophy and chronic microvascular ischemic
changes.

No cervical spine fracture or traumatic listhesis.

Multilevel spondylosis with grade 1 C4-5 anterolisthesis of 3 mm,
unchanged.

## 2020-06-26 IMAGING — CT CT HEAD W/O CM
3 series · 15 of 47 positions shown, 18 images · non-contrast
Comparison: 12/12/2015 CT head and lcervical spine.

CLINICAL DATA: Fall, head trauma.

EXAM:
CT HEAD WITHOUT CONTRAST
CT CERVICAL SPINE WITHOUT CONTRAST
TECHNIQUE: Multidetector CT imaging of the head and cervical spine was
performed following the standard protocol without intravenous
contrast. Multiplanar CT image reconstructions of the cervical spine
were also generated.

[Series 3: coronal soft tissue · coronal · 0.31mm/px · 3 of 84 slices shown]
[im 33/84  brain]
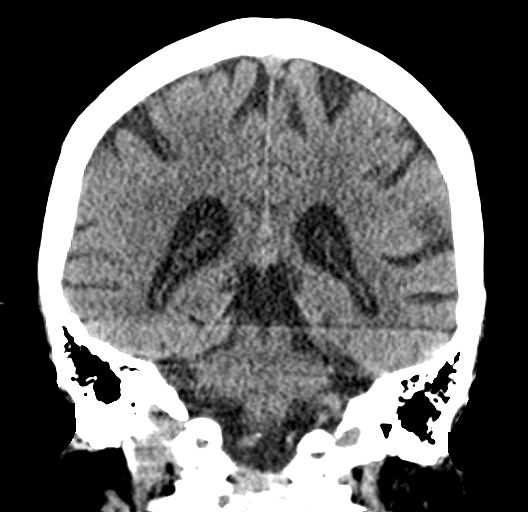
[im 39/84  brain]
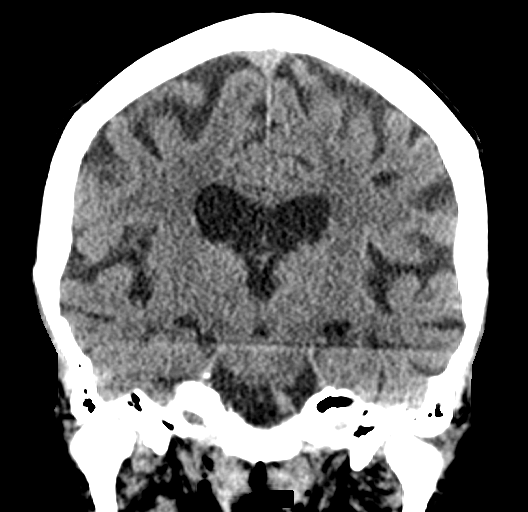
[im 45/84  brain]
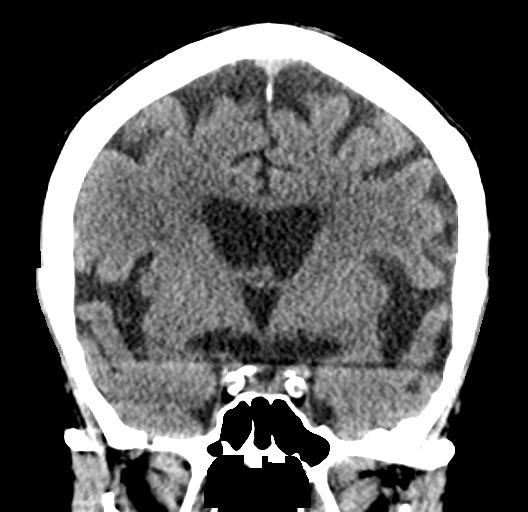

[Series 4: sagittal soft tissue · sagittal · 0.36mm/px · 3 of 78 slices shown]
[im 26/78  brain]
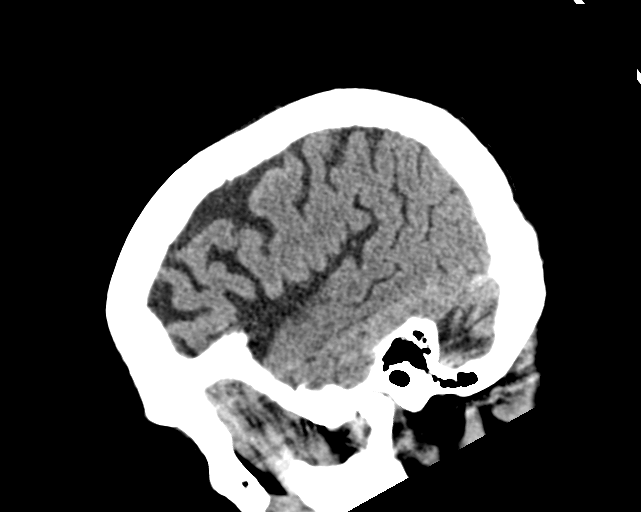
[im 39/78  brain]
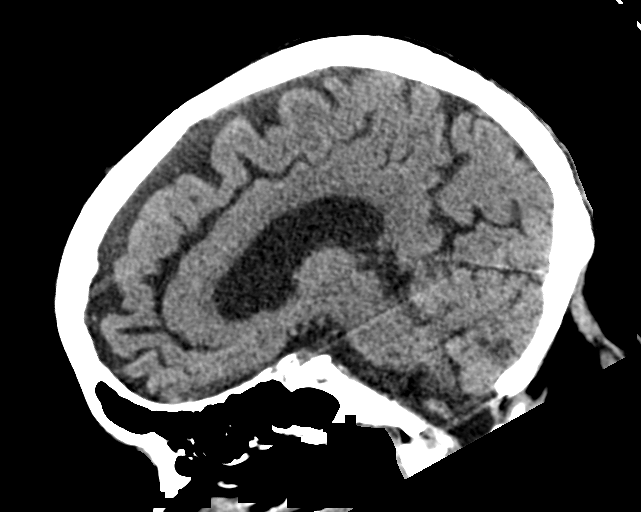
[im 52/78  brain]
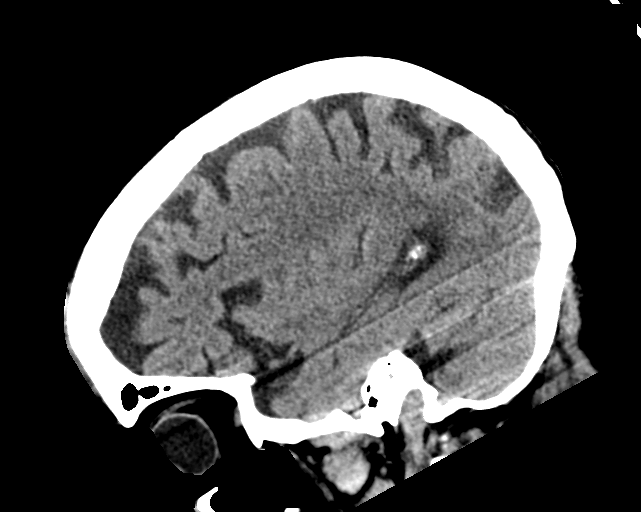

[Series 5: head wo · axial · 0.42mm/px · z∈[-194,-59]mm · 9 of 33 slices shown, 12 images]
[im 3/33  brain]
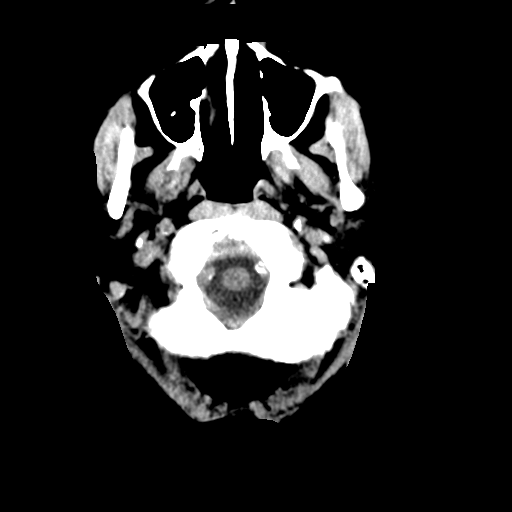
[im 3/33  bone]
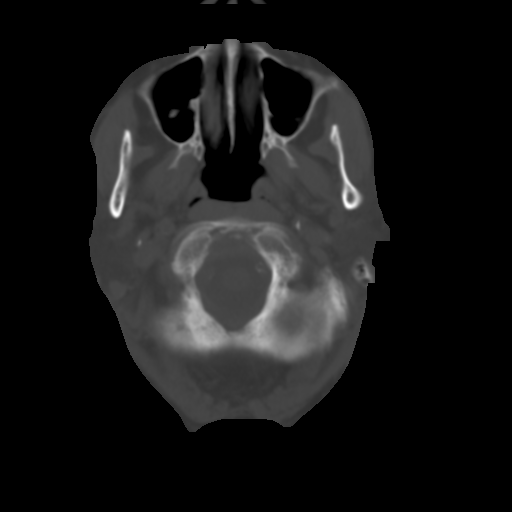
[im 6/33  brain]
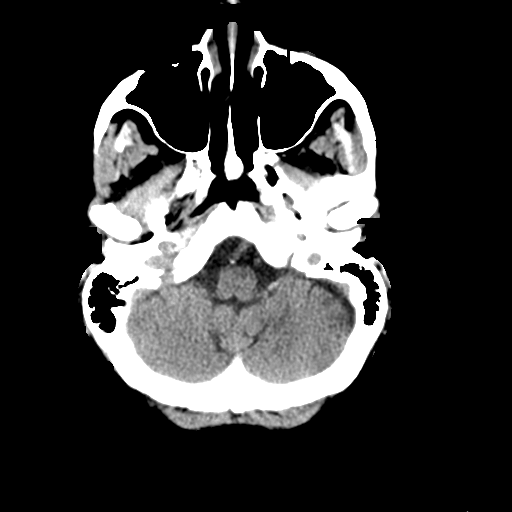
[im 9/33  brain]
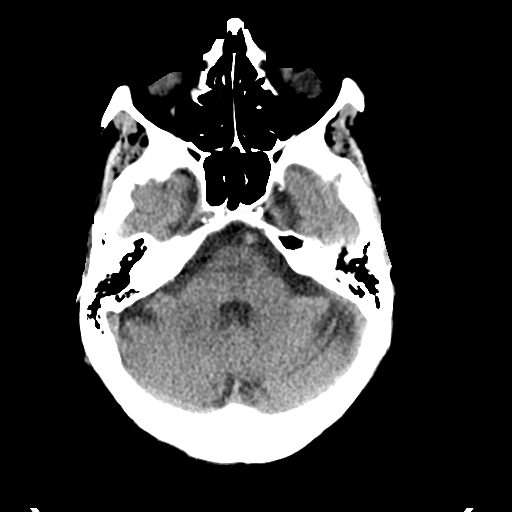
[im 13/33  brain]
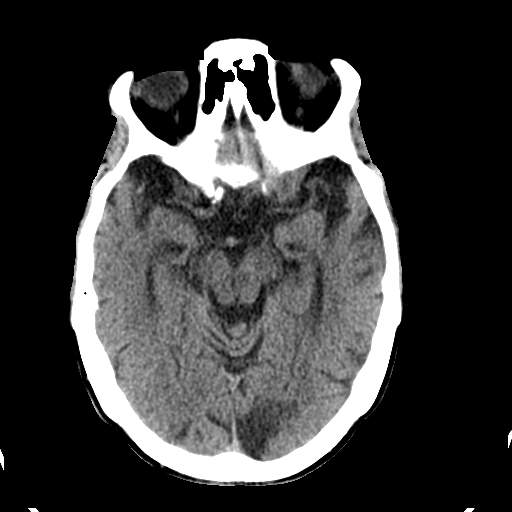
[im 17/33  brain]
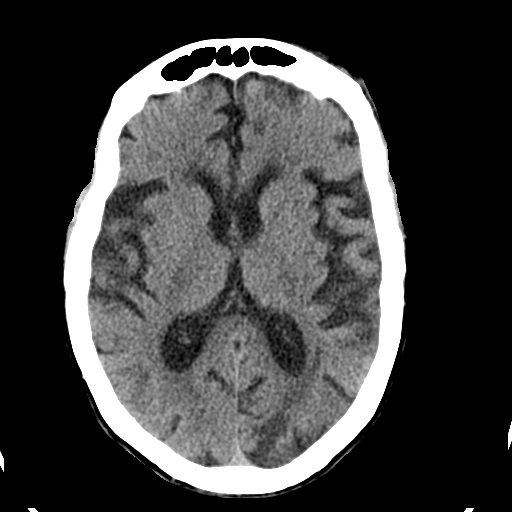
[im 17/33  bone]
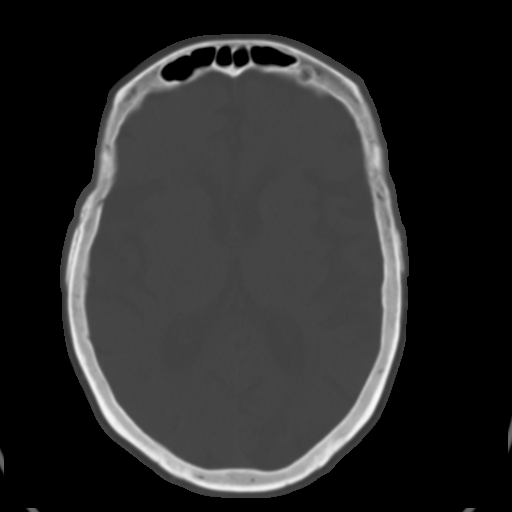
[im 20/33  brain]
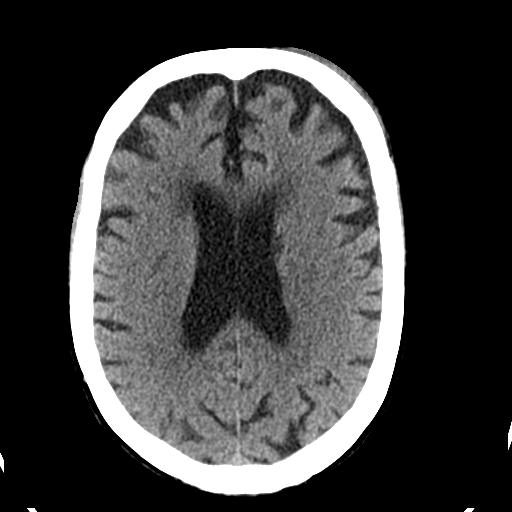
[im 24/33  brain]
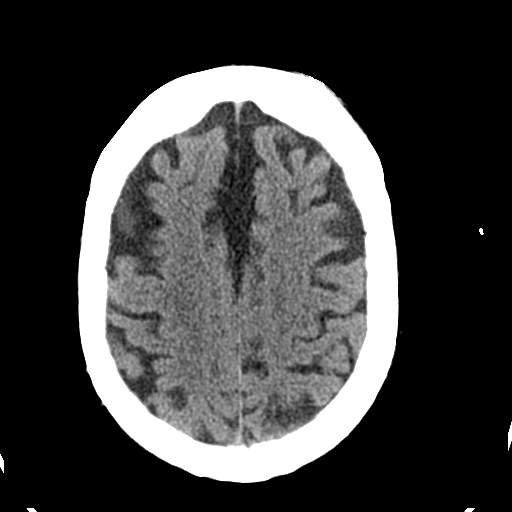
[im 27/33  brain]
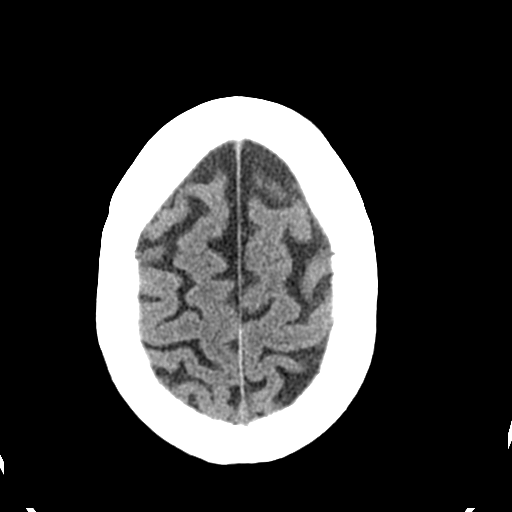
[im 30/33  brain]
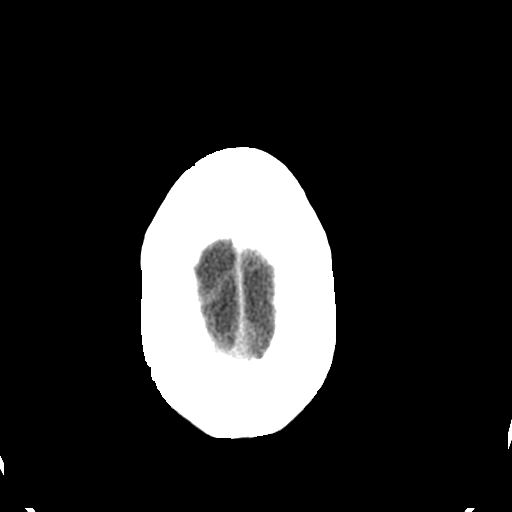
[im 30/33  bone]
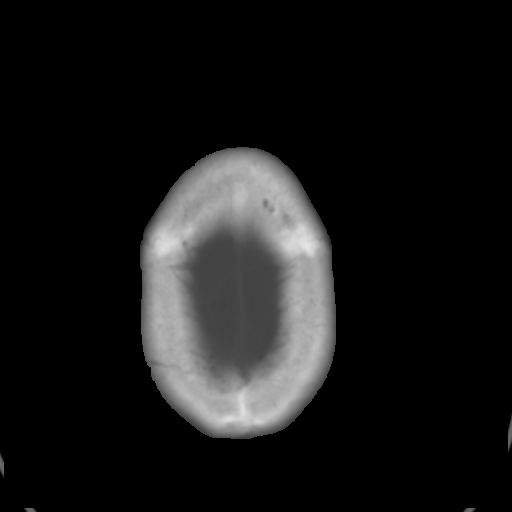

[15 of 47 positions shown; findings below may reference images not displayed]

FINDINGS: CT HEAD FINDINGS

Brain: Mild diffuse parenchymal volume loss with ex vacuo
dilatation. Sequela of remote left occipital infarct. Scattered
periventricular and deep white matter hypodense foci are nonspecific
however commonly associated with chronic microvascular ischemic
changes. No acute infarct. No midline shift or mass lesion.

Vascular: No hyperdense vessel. Bilateral carotid siphon and V4
segment atherosclerotic calcifications.

Skull: No acute fracture or focal lesion. Left frontal scalp
hematoma.

Sinuses/Orbits: Sequela of left lens replacement. Normal orbits.
Clear paranasal sinuses. No mastoid effusion.

Other: None.

CT CERVICAL SPINE FINDINGS

Alignment: Grade 1 C4-5 anterolisthesis of 3 mm ([DATE]). Minimal
grade 1 C3-4 anterolisthesis.

Skull base and vertebrae: No fracture.  No focal bone lesion.

Soft tissues and spinal canal: No prevertebral fluid or swelling. No
visible canal hematoma.

Disc levels: Patent bony spinal canal. Multilevel degenerative
changes including endplate sclerosis, osteophytosis, Schmorl's node
formation and severe disc space loss at the C4-T1 levels. Multilevel
facet hypertrophy.

Upper chest: Clear lung apices.

Other: Calcified atheromatous plaque involving the bilateral carotid
bifurcations, aortic arch and proximal left subclavian artery.
IMPRESSION: No acute intracranial process.  Small left frontal scalp hematoma.

Remote left occipital infarct, new since 2188.

Mild diffuse cerebral atrophy and chronic microvascular ischemic
changes.

No cervical spine fracture or traumatic listhesis.

Multilevel spondylosis with grade 1 C4-5 anterolisthesis of 3 mm,
unchanged.

## 2020-09-02 ENCOUNTER — Encounter
Admission: RE | Admit: 2020-09-02 | Discharge: 2020-09-02 | Disposition: A | Discharge status: Home / Self Care | Payer: Medicare Other | Source: Ambulatory Visit | Attending: Internal Medicine | Admitting: Internal Medicine

## 2020-09-14 ENCOUNTER — Inpatient Hospital Stay
Admission: EM | Admit: 2020-09-14 | Discharge: 2020-09-23 | DRG: 885 | Disposition: A | Discharge status: Other Acute Inpatient Hospital | Payer: Medicare Other | Attending: Family Medicine | Admitting: Family Medicine

## 2020-09-14 ENCOUNTER — Other Ambulatory Visit: Payer: Self-pay

## 2020-09-14 DIAGNOSIS — F419 Anxiety disorder, unspecified: Secondary | ICD-10-CM | POA: Diagnosis present

## 2020-09-14 DIAGNOSIS — Z885 Allergy status to narcotic agent status: Secondary | ICD-10-CM

## 2020-09-14 DIAGNOSIS — R45851 Suicidal ideations: Secondary | ICD-10-CM | POA: Diagnosis not present

## 2020-09-14 DIAGNOSIS — X789XXA Intentional self-harm by unspecified sharp object, initial encounter: Secondary | ICD-10-CM | POA: Diagnosis present

## 2020-09-14 DIAGNOSIS — X788XXA Intentional self-harm by other sharp object, initial encounter: Secondary | ICD-10-CM | POA: Diagnosis present

## 2020-09-14 DIAGNOSIS — Z7289 Other problems related to lifestyle: Secondary | ICD-10-CM | POA: Diagnosis not present

## 2020-09-14 DIAGNOSIS — Z974 Presence of external hearing-aid: Secondary | ICD-10-CM

## 2020-09-14 DIAGNOSIS — Z79899 Other long term (current) drug therapy: Secondary | ICD-10-CM

## 2020-09-14 DIAGNOSIS — F3164 Bipolar disorder, current episode mixed, severe, with psychotic features: Secondary | ICD-10-CM | POA: Diagnosis not present

## 2020-09-14 DIAGNOSIS — Z9049 Acquired absence of other specified parts of digestive tract: Secondary | ICD-10-CM

## 2020-09-14 DIAGNOSIS — K5730 Diverticulosis of large intestine without perforation or abscess without bleeding: Secondary | ICD-10-CM | POA: Diagnosis present

## 2020-09-14 DIAGNOSIS — H9190 Unspecified hearing loss, unspecified ear: Secondary | ICD-10-CM | POA: Diagnosis present

## 2020-09-14 DIAGNOSIS — I959 Hypotension, unspecified: Secondary | ICD-10-CM | POA: Diagnosis present

## 2020-09-14 DIAGNOSIS — Z20822 Contact with and (suspected) exposure to covid-19: Secondary | ICD-10-CM | POA: Diagnosis present

## 2020-09-14 DIAGNOSIS — R109 Unspecified abdominal pain: Secondary | ICD-10-CM | POA: Diagnosis present

## 2020-09-14 DIAGNOSIS — E869 Volume depletion, unspecified: Secondary | ICD-10-CM | POA: Diagnosis present

## 2020-09-14 DIAGNOSIS — R1031 Right lower quadrant pain: Secondary | ICD-10-CM

## 2020-09-14 DIAGNOSIS — Z888 Allergy status to other drugs, medicaments and biological substances status: Secondary | ICD-10-CM

## 2020-09-14 DIAGNOSIS — K219 Gastro-esophageal reflux disease without esophagitis: Secondary | ICD-10-CM | POA: Diagnosis present

## 2020-09-14 DIAGNOSIS — Z823 Family history of stroke: Secondary | ICD-10-CM

## 2020-09-14 DIAGNOSIS — Z882 Allergy status to sulfonamides status: Secondary | ICD-10-CM

## 2020-09-14 DIAGNOSIS — M1990 Unspecified osteoarthritis, unspecified site: Secondary | ICD-10-CM | POA: Diagnosis present

## 2020-09-14 DIAGNOSIS — I2510 Atherosclerotic heart disease of native coronary artery without angina pectoris: Secondary | ICD-10-CM | POA: Diagnosis present

## 2020-09-14 DIAGNOSIS — S31119A Laceration without foreign body of abdominal wall, unspecified quadrant without penetration into peritoneal cavity, initial encounter: Secondary | ICD-10-CM | POA: Diagnosis present

## 2020-09-14 DIAGNOSIS — Z886 Allergy status to analgesic agent status: Secondary | ICD-10-CM

## 2020-09-14 DIAGNOSIS — M549 Dorsalgia, unspecified: Secondary | ICD-10-CM | POA: Diagnosis present

## 2020-09-14 DIAGNOSIS — Y92009 Unspecified place in unspecified non-institutional (private) residence as the place of occurrence of the external cause: Secondary | ICD-10-CM

## 2020-09-14 DIAGNOSIS — F32A Depression, unspecified: Secondary | ICD-10-CM | POA: Diagnosis present

## 2020-09-14 DIAGNOSIS — R1310 Dysphagia, unspecified: Secondary | ICD-10-CM | POA: Diagnosis present

## 2020-09-14 DIAGNOSIS — K449 Diaphragmatic hernia without obstruction or gangrene: Secondary | ICD-10-CM | POA: Diagnosis present

## 2020-09-14 DIAGNOSIS — E871 Hypo-osmolality and hyponatremia: Secondary | ICD-10-CM | POA: Diagnosis present

## 2020-09-14 DIAGNOSIS — Z8719 Personal history of other diseases of the digestive system: Secondary | ICD-10-CM

## 2020-09-14 DIAGNOSIS — I10 Essential (primary) hypertension: Secondary | ICD-10-CM | POA: Diagnosis present

## 2020-09-14 DIAGNOSIS — F332 Major depressive disorder, recurrent severe without psychotic features: Secondary | ICD-10-CM | POA: Diagnosis not present

## 2020-09-14 DIAGNOSIS — Z8249 Family history of ischemic heart disease and other diseases of the circulatory system: Secondary | ICD-10-CM

## 2020-09-14 DIAGNOSIS — Z85828 Personal history of other malignant neoplasm of skin: Secondary | ICD-10-CM

## 2020-09-14 DIAGNOSIS — D649 Anemia, unspecified: Secondary | ICD-10-CM | POA: Diagnosis present

## 2020-09-14 DIAGNOSIS — Z90710 Acquired absence of both cervix and uterus: Secondary | ICD-10-CM

## 2020-09-14 DIAGNOSIS — E860 Dehydration: Secondary | ICD-10-CM | POA: Diagnosis present

## 2020-09-14 DIAGNOSIS — K429 Umbilical hernia without obstruction or gangrene: Secondary | ICD-10-CM | POA: Diagnosis present

## 2020-09-14 DIAGNOSIS — E872 Acidosis: Secondary | ICD-10-CM | POA: Diagnosis present

## 2020-09-14 LAB — URINE DRUG SCREEN, QUALITATIVE (ARMC ONLY)
Amphetamines, Ur Screen: NOT DETECTED
Barbiturates, Ur Screen: NOT DETECTED
Benzodiazepine, Ur Scrn: NOT DETECTED
Cannabinoid 50 Ng, Ur ~~LOC~~: NOT DETECTED
Cocaine Metabolite,Ur ~~LOC~~: NOT DETECTED
MDMA (Ecstasy)Ur Screen: NOT DETECTED
Methadone Scn, Ur: NOT DETECTED
Opiate, Ur Screen: NOT DETECTED
Phencyclidine (PCP) Ur S: NOT DETECTED
Tricyclic, Ur Screen: NOT DETECTED

## 2020-09-14 LAB — COMPREHENSIVE METABOLIC PANEL
ALT: 14 U/L (ref 0–44)
AST: 20 U/L (ref 15–41)
Albumin: 4 g/dL (ref 3.5–5.0)
Alkaline Phosphatase: 61 U/L (ref 38–126)
Anion gap: 13 (ref 5–15)
BUN: 30 mg/dL — ABNORMAL HIGH (ref 8–23)
CO2: 22 mmol/L (ref 22–32)
Calcium: 9 mg/dL (ref 8.9–10.3)
Chloride: 104 mmol/L (ref 98–111)
Creatinine, Ser: 0.84 mg/dL (ref 0.44–1.00)
GFR, Estimated: >60 mL/min (ref >60)
Glucose, Bld: 112 mg/dL — ABNORMAL HIGH (ref 70–99)
Potassium: 3.6 mmol/L (ref 3.5–5.1)
Sodium: 139 mmol/L (ref 135–145)
Total Bilirubin: 0.4 mg/dL (ref 0.3–1.2)
Total Protein: 6.6 g/dL (ref 6.5–8.1)

## 2020-09-14 LAB — CBC
HCT: 29.8 % — ABNORMAL LOW (ref 36.0–46.0)
Hemoglobin: 8.9 g/dL — ABNORMAL LOW (ref 12.0–15.0)
MCH: 23.7 pg — ABNORMAL LOW (ref 26.0–34.0)
MCHC: 29.9 g/dL — ABNORMAL LOW (ref 30.0–36.0)
MCV: 79.3 fL — ABNORMAL LOW (ref 80.0–100.0)
Platelets: 191 10*3/uL (ref 150–400)
RBC: 3.76 MIL/uL — ABNORMAL LOW (ref 3.87–5.11)
RDW: 15.3 % (ref 11.5–15.5)
WBC: 7.2 10*3/uL (ref 4.0–10.5)
nRBC: 0 % (ref 0.0–0.2)

## 2020-09-14 LAB — SALICYLATE LEVEL: Salicylate Lvl: <7 mg/dL — ABNORMAL LOW (ref 7.0–30.0)

## 2020-09-14 LAB — ACETAMINOPHEN LEVEL: Acetaminophen (Tylenol), Serum: <10 ug/mL — ABNORMAL LOW (ref 10–30)

## 2020-09-14 LAB — ETHANOL: Alcohol, Ethyl (B): <10 mg/dL (ref <10)

## 2020-09-14 NOTE — ED Notes (Signed)
Pt placed onto hospital bed, extra linen removed from underneath pt. Sitter at bedside for 1:1 [employer]. Bed alarm in place and clean, dry pads placed underneath pt.
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
## 2020-09-14 NOTE — ED Notes (Signed)
EKG given to provider for review

## 2020-09-14 NOTE — ED Notes (Signed)
Pt belongings:  1 yellow bracelet 1 yellow necklace 1 black hair tie 1 green shirt 1 pair of black shoes 1 pair white socks 1 black pant 1 blue jacket 1 blue pair gloves 1 maxi-pad

## 2020-09-14 NOTE — ED Provider Notes (Addendum)
[facility] [place] Emergency Department Provider Note  ____________________________________________   I have reviewed the triage vital signs and the nursing notes.   HISTORY  Chief Complaint Psychiatric Evaluation (SI attempt)   History limited by: Not Limited   HPI Cindy Fitzpatrick is a 84 y.o. female who presents to the emergency department today because of concerns for suicidal ideation and self cutting.  Patient states she has a long history of depression.  She states that she has had thoughts about self-harm in the past although has never attempted.  She states that she does want to die.  She does admit to cutting herself in the abdomen today in an attempt to harm herself and kill herself.  She says that a big part of her stressor now is her current living situation.   Records reviewed. Per medical record review patient has a history of depression.   Past Medical History:  Diagnosis Date  . Arthritis   . Blood in stool   . Cancer ([place])    skin  . Chicken pox    SHINGLES TWICE  . Colon polyps   . Depression   . Diverticulitis   . GERD (gastroesophageal reflux disease)   . Hay fever   . Heart disease   . Heart murmur   . Hyperlipidemia   . UTI (urinary tract infection)   . Varicose veins of both lower extremities     Patient Active Problem List   Diagnosis Date Noted  . Decreased hearing 07/27/2019  . Depression 08/24/2018  . Hypokalemia 06/13/2018  . Ankle fracture 05/22/2018  . Chronic constipation 04/19/2018  . Anxiety disorder 04/19/2018  . Traumatic hematoma of left lower leg 02/17/2018  . Blister 11/30/2017  . Dysuria 06/03/2017  . Dark stools 08/26/2016  . Diverticulitis of small intestine without perforation or abscess with bleeding   . Ischemic chest pain ([place])   . History of coronary artery stent placement   . Heart murmur 04/05/2016  . Ventral hernia 03/15/2016  . Osteopenia 01/21/2016  . Fall at home 12/25/2015  . Chronic pain  syndrome 02/13/2015  . Fatigue 02/13/2015  . Obesity (BMI 30-39.9) 12/01/2014  . Bipolar affective disorder ([place]) 03/25/2014  . Rheumatoid arthritis ([place]) 03/25/2014  . Chronic anemia 03/25/2014  . CAD in native artery 03/25/2014  . GERD without esophagitis 03/25/2014  . Benign essential HTN 03/25/2014  . HLD (hyperlipidemia) 03/25/2014    Past Surgical History:  Procedure Laterality Date  . HERNIA REPAIR    . KNEE SURGERY    . ORIF ANKLE FRACTURE Right 05/22/2018   Procedure: OPEN REDUCTION INTERNAL FIXATION (ORIF) TRIMALLEOLAR  ANKLE FRACTURE;  Surgeon: Leim Fabry, MD;  Location: [facility] ORS;  Service: Orthopedics;  Laterality: Right;      Allergies Codeine, Hydrocodone-acetaminophen, Meperidine, Niacin er, Other, Propoxyphene, Solifenacin, Statins, Sulfa antibiotics, Fish-derived products, Nabumetone, and Oxycodone-acetaminophen  Family History  Problem Relation Age of Onset  . Stroke Mother   . Heart attack Father     Social History Social History   Tobacco Use  . Smoking status: Never Smoker  . Smokeless tobacco: Never Used  Vaping Use  . Vaping Use: Never used  Substance Use Topics  . Alcohol use: No    Alcohol/week: 0.0 standard drinks  . Drug use: No    Review of Systems Constitutional: No fever/chills Eyes: No visual changes. ENT: No sore throat. Cardiovascular: Denies chest pain. Respiratory: Denies shortness of breath. Gastrointestinal: No abdominal pain.  No nausea, no vomiting.  No diarrhea.   Genitourinary: Negative for dysuria. Musculoskeletal: Negative for back pain. Skin: Positive for superficial laceration to abdomen.  Neurological: Negative for headaches, focal weakness or numbness.  ____________________________________________   PHYSICAL EXAM:  VITAL SIGNS: ED Triage Vitals [09/14/20 1947]  Enc Vitals Group     BP 140/73     Pulse Rate 88     Resp 20     Temp 97.8 F (36.6 C)     Temp Source Oral     SpO2 98 %    Constitutional: Alert and oriented.  Eyes: Conjunctivae are normal.  ENT      Head: Normocephalic and atraumatic.      Nose: No congestion/rhinnorhea.      Mouth/Throat: Mucous membranes are moist.      Neck: No stridor. Hematological/Lymphatic/Immunilogical: No cervical lymphadenopathy. Cardiovascular: Normal rate, regular rhythm. Systolic murmur. Respiratory: Normal respiratory effort without tachypnea nor retractions. Breath sounds are clear and equal bilaterally. No wheezes/rales/rhonchi. Gastrointestinal: Soft and non tender. No rebound. No guarding.  Genitourinary: Deferred Musculoskeletal: Normal range of motion in all extremities. No lower extremity edema. Neurologic:  Normal speech and language. No gross focal neurologic deficits are appreciated.  Skin:  Superficial lacerations to abdomen.  Psychiatric: Depressed. Endorses SI. ____________________________________________    LABS (pertinent positives/negatives)  Acetaminophen, salicylate, ethanol below threshold CBC wbc 7.2, hgb 8.9, plt 191 CMP wnl except glu 112, BUN 30  ____________________________________________   EKG  I, Nance Pear, attending physician, personally viewed and interpreted this EKG  EKG Time: 1946 Rate: 82 Rhythm: normal sinus rhythm Axis: normal Intervals: qtc 476 QRS: RBBB ST changes: no st elevation Impression: abnormal ekg  ____________________________________________    RADIOLOGY  None  ____________________________________________   PROCEDURES  Procedures  ____________________________________________   INITIAL IMPRESSION / ASSESSMENT AND PLAN / ED COURSE  Pertinent labs & imaging results that were available during my care of the patient were reviewed by me and considered in my medical decision making (see chart for details).   Patient presented to the emergency department today because of concerns for depression and suicidal ideation and self cutting.  On exam  patient does have superficial lacerations to the abdomen.  She does appear depressed and admits to suicidal ideation.  Will place patient under IVC and have psychiatry evaluate.  The patient has been placed in psychiatric observation due to the need to provide a safe environment for the patient while obtaining psychiatric consultation and evaluation, as well as ongoing medical and medication management to treat the patient's condition.  The patient has been placed under full IVC at this time.     ____________________________________________   FINAL CLINICAL IMPRESSION(S) / ED DIAGNOSES  Final diagnoses:  Deliberate self-cutting  Suicidal ideation     Note: This dictation was prepared with Dragon dictation. Any transcriptional errors that result from this process are unintentional     Nance Pear, MD 09/14/20 2147    Nance Pear, MD 09/14/20 2150

## 2020-09-15 DIAGNOSIS — F332 Major depressive disorder, recurrent severe without psychotic features: Secondary | ICD-10-CM | POA: Diagnosis not present

## 2020-09-15 DIAGNOSIS — Z7289 Other problems related to lifestyle: Secondary | ICD-10-CM | POA: Insufficient documentation

## 2020-09-15 DIAGNOSIS — R45851 Suicidal ideations: Secondary | ICD-10-CM | POA: Diagnosis not present

## 2020-09-15 MED ORDER — MORPHINE SULFATE (PF) 2 MG/ML IV SOLN: 2.0000 mg | Freq: Once | INTRAVENOUS | Status: DC

## 2020-09-15 MED ORDER — DICYCLOMINE HCL 10 MG PO CAPS
10.0000 mg | ORAL_CAPSULE | Freq: Once | ORAL | Status: DC
  Filled 2020-09-15: qty 1

## 2020-09-15 MED ORDER — ALUM & MAG HYDROXIDE-SIMETH 200-200-20 MG/5ML PO SUSP
15.0000 mL | Freq: Once | ORAL | Status: AC
  Administered 2020-09-15: 15 mL via ORAL
  Filled 2020-09-15: qty 30

## 2020-09-15 MED ORDER — TRAMADOL HCL 50 MG PO TABS: 50.0000 mg | ORAL_TABLET | Freq: Once | ORAL | Status: DC

## 2020-09-15 MED ORDER — FAMOTIDINE 20 MG PO TABS
20.0000 mg | ORAL_TABLET | Freq: Once | ORAL | Status: AC
  Administered 2020-09-15: 20 mg via ORAL
  Filled 2020-09-15: qty 1

## 2020-09-15 NOTE — ED Notes (Signed)
Pt noted to have visual hallucinations.  Pt states she is seeing her daughter who is not present.

## 2020-09-15 NOTE — ED Notes (Signed)
This RN spoke with pt regarding her suicide attempt.  Pt stated she wanted to kill herself because "life isn't worth living anymore."  Pt stated her family "doesn't care about 75 year-olds." Pt very depressed during talk.  Pt calm and cooperative w/staff.  Pt also confused at baseline and thinks she has an apartment here in this building.  Pt endorses "pain all over," which is chronic.

## 2020-09-15 NOTE — ED Provider Notes (Signed)
Emergency Medicine Observation Re-evaluation Note  Cindy Fitzpatrick is a 84 y.o. female, seen on rounds today.  Pt initially presented to the ED for complaints of Psychiatric Evaluation (SI attempt) Currently, the patient is resting, voices no medical complaints.  Physical Exam  BP (!) 175/76 (BP Location: Right Arm)   Pulse 86   Temp 97.8 F (36.6 C) (Oral)   Resp 17   SpO2 98%  Physical Exam General: Resting in no acute distress Cardiac: No cyanosis Lungs: Equal rise and fall Psych: Not agitated  ED Course / MDM  EKG:    I have reviewed the labs performed to date as well as medications administered while in observation.  Recent changes in the last 24 hours include no events overnight.  Plan  Current plan is for psychiatric admission per NP Rishaun's recommendations. Patient is under full IVC at this time.   Paulette Blanch, MD 09/15/20 [phone]

## 2020-09-15 NOTE — BH Assessment (Addendum)
Referral information for Psychiatric Hospitalization faxed to:  [facility] [phone])  .[facility] [phone], [phone] or [phone])  [facility] [phone]- [phone], [phone])  [facility] [phone] or [phone]),   .[facility] [phone] -or- [phone]),   .[facility] [phone] or [phone])  .[facility] [phone])

## 2020-09-15 NOTE — BH Assessment (Addendum)
TTS and Waylan Boga, NP attempted to make contact with Pt's facility [facility] at [address] [phone]) to gather collateral but was unsuccessful due to no answer. TTS was also unable to leave a voicemail due to mailbox being full.   TTS will continue to make attempts with facility accordingly.  Per Waylan Boga, pt meets criteria for gero psych
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
## 2020-09-15 NOTE — ED Notes (Signed)
Pt has refused any PRN medications offered for pain/discomfort for various reasons.

## 2020-09-15 NOTE — ED Notes (Signed)
Pt on bed alarm
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
## 2020-09-16 LAB — RESP PANEL BY RT-PCR (FLU A&B, COVID) ARPGX2
Influenza A by PCR: NEGATIVE
Influenza B by PCR: NEGATIVE
SARS Coronavirus 2 by RT PCR: NEGATIVE

## 2020-09-16 MED ORDER — RISPERIDONE 1 MG PO TABS
0.5000 mg | ORAL_TABLET | Freq: Two times a day (BID) | ORAL | Status: DC
  Administered 2020-09-16 – 2020-09-23 (×15): 0.5 mg via ORAL
  Filled 2020-09-16 (×15): qty 1

## 2020-09-16 MED ORDER — ACETAMINOPHEN 325 MG PO TABS
650.0000 mg | ORAL_TABLET | Freq: Four times a day (QID) | ORAL | Status: DC | PRN
  Administered 2020-09-16: 650 mg via ORAL
  Filled 2020-09-16: qty 2

## 2020-09-16 MED ORDER — HYDROXYZINE HCL 25 MG PO TABS
50.0000 mg | ORAL_TABLET | Freq: Once | ORAL | Status: AC
  Administered 2020-09-16: 50 mg via ORAL
  Filled 2020-09-16: qty 2

## 2020-09-16 NOTE — BH Assessment (Signed)
Writer spoke with the patient's son [name]) about her disposition. He request for the patient to be transferred to [facility]. His wife works there and been in Clinical research associate with Geriatric Psych Department. Per the request of the son and permission of Psych NP Waylan Boga), Probation officer provided him with the NP's number for the [facility] Psychiatrist to call her for a "doc to doc" conversation for possible admission. Writer further explained, if she doesn't get admitted at [facility], TTS will continue to look for other Geriatric Hospitals.

## 2020-09-16 NOTE — ED Notes (Addendum)
Pt son at bedside with POA paperwork.  States pt cannot go back to facility/village at [facility] and he was under impression pt would be placed in geri-psych after coming to ED.  Also states pt has been having hallucinations at facility and he has been working on changing psych meds  [facility] TTS notified via secure chat

## 2020-09-16 NOTE — TOC Progression Note (Signed)
Transition of Care [facility]) - Progression Note    Patient Details  Name: Cindy Fitzpatrick MRN: [id number] Date of Birth: [birth year]  Transition of Care [facility]) CM/SW [place], [place] Phone Number: [phone] 09/16/2020, 9:52 AM  Clinical Narrative:     CSW received call from [place] at [facility], gave her contact information for TTS.  Patient is currently IVC and TTS is searching for placement.        Expected Discharge Plan and Services                                                 Social Determinants of Health (SDOH) Interventions    Readmission Risk Interventions No flowsheet data found.

## 2020-09-16 NOTE — ED Provider Notes (Signed)
Emergency Medicine Observation Re-evaluation Note  TEMEKA PORE is a 84 y.o. female, seen on rounds today.  Pt initially presented to the ED for complaints of Psychiatric Evaluation (SI attempt) Currently, the patient is resting, voices no medical complaints.  Physical Exam  BP (!) 145/58 (BP Location: Left Arm)   Pulse 92   Temp 98.2 F (36.8 C) (Oral)   Resp 20   SpO2 97%  Physical Exam General: Resting in no acute distress Cardiac: No cyanosis Lungs: Equal rise and fall Psych: Not agitated  ED Course / MDM  EKG:    I have reviewed the labs performed to date as well as medications administered while in observation.  Recent changes in the last 24 hours include patient received Vistaril for sleep.  Pharmacy tech attempting to reconcile patient's medications, however unable to reach anyone at her facility.  Daytime pharmacy tech will attempt so patient's home medications may be entered into the computer.  Plan  Current plan is for [facility] psych hospitalization. Patient is under full IVC at this time.   Paulette Blanch, MD 09/16/20 [phone]

## 2020-09-16 NOTE — ED Notes (Signed)
Pt given breakfast tray

## 2020-09-16 NOTE — ED Notes (Signed)
Pt provided lunch tray.

## 2020-09-16 NOTE — ED Notes (Signed)
TTS at bedside. 

## 2020-09-16 NOTE — ED Notes (Signed)
Pt resting in bed in low position at this time. Safety sitter at bedside.
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
## 2020-09-16 NOTE — ED Notes (Signed)
Pt in bed with rails up. Brushing teeth at this time. Supervised by Air cabin crew .

## 2020-09-16 NOTE — ED Notes (Signed)
IVC/  PENDING  PLACEMENT 

## 2020-09-16 NOTE — ED Notes (Signed)
Pt appears anxious and paranoid. Asking "who's out there?", this RN explained there are people cleaning outside, she states "theres nothing worse, that's who turned me in, theres nothing worse" Expressing she had to leave her church d/t people shunning her and trying to "get dogs after here".  Pt expressing hearing "we have to get rid of Javionna Leder" over and over was too overwhelming

## 2020-09-16 NOTE — ED Notes (Signed)
Pt sleeping, will collect vitals upon waking.  

## 2020-09-16 NOTE — ED Notes (Signed)
Will assess vital signs upon awakening

## 2020-09-16 NOTE — ED Provider Notes (Signed)
Emergency Medicine Observation Re-evaluation Note  Cindy Fitzpatrick is a 84 y.o. female, seen on rounds today.  Pt initially presented to the ED for complaints of Psychiatric Evaluation (SI attempt) Currently, the patient is sitting in bed eating dinner, denies complaints.  Physical Exam  BP (!) 160/61 (BP Location: Right Arm)   Pulse 70   Temp 97.7 F (36.5 C) (Oral)   Resp 19   SpO2 100%  Physical Exam Constitutional: Resting comfortably. Eyes: Conjunctivae are normal. Head: Atraumatic. Nose: No congestion/rhinnorhea. Mouth/Throat: Mucous membranes are moist. Neck: Normal ROM Cardiovascular: No cyanosis noted. Respiratory: Normal respiratory effort. Gastrointestinal: Non-distended. Genitourinary: deferred Musculoskeletal: No lower extremity tenderness nor edema. Neurologic:  Normal speech and language. No gross focal neurologic deficits are appreciated. Skin:  Skin is warm, dry and intact. No rash noted.    ED Course / MDM  EKG:EKG Interpretation  Date/Time:  Sunday September 14 2020 19:46:00 EST Ventricular Rate:  82 PR Interval:  182 QRS Duration: 140 QT Interval:  408 QTC Calculation: 476 R Axis:   72 Text Interpretation: Normal sinus rhythm Right bundle branch block Abnormal ECG Confirmed by UNCONFIRMED, DOCTOR ([id number]), editor Mel Almond, Tammy [phone]) on 09/16/2020 2:30:24 PM    I have reviewed the labs performed to date as well as medications administered while in observation.  Recent changes in the last 24 hours include none.  Plan  Current plan is for geriatric psychiatry admission. Patient is under full IVC at this time.   Blake Divine, MD 09/16/20 1757

## 2020-09-17 DIAGNOSIS — F3164 Bipolar disorder, current episode mixed, severe, with psychotic features: Secondary | ICD-10-CM | POA: Diagnosis present

## 2020-09-17 MED ORDER — ALUM & MAG HYDROXIDE-SIMETH 200-200-20 MG/5ML PO SUSP: 30.0000 mL | ORAL | Status: DC | PRN

## 2020-09-17 MED ORDER — POLYETHYLENE GLYCOL 3350 17 G PO PACK
17.0000 g | PACK | Freq: Every day | ORAL | Status: DC
  Administered 2020-09-17 – 2020-09-22 (×5): 17 g via ORAL
  Filled 2020-09-17 (×5): qty 1

## 2020-09-17 MED ORDER — BUPROPION HCL ER (SR) 150 MG PO TB12
150.0000 mg | ORAL_TABLET | Freq: Every day | ORAL | Status: DC
  Administered 2020-09-17 – 2020-09-22 (×6): 150 mg via ORAL
  Filled 2020-09-17 (×7): qty 1

## 2020-09-17 MED ORDER — POLYVINYL ALCOHOL 1.4 % OP SOLN
2.0000 [drp] | Freq: Two times a day (BID) | OPHTHALMIC | Status: DC | PRN
  Filled 2020-09-17: qty 15

## 2020-09-17 MED ORDER — HYDROCHLOROTHIAZIDE 12.5 MG PO CAPS
12.5000 mg | ORAL_CAPSULE | Freq: Every day | ORAL | Status: DC
  Administered 2020-09-17 – 2020-09-18 (×2): 12.5 mg via ORAL
  Filled 2020-09-17 (×2): qty 1

## 2020-09-17 MED ORDER — CLONIDINE HCL 0.1 MG PO TABS
0.1000 mg | ORAL_TABLET | Freq: Two times a day (BID) | ORAL | Status: DC
  Administered 2020-09-17: 0.1 mg via ORAL
  Filled 2020-09-17 (×2): qty 1

## 2020-09-17 MED ORDER — LOSARTAN POTASSIUM 50 MG PO TABS
50.0000 mg | ORAL_TABLET | Freq: Every day | ORAL | Status: DC
  Administered 2020-09-17 – 2020-09-18 (×2): 50 mg via ORAL
  Filled 2020-09-17 (×2): qty 1

## 2020-09-17 MED ORDER — POTASSIUM CHLORIDE CRYS ER 20 MEQ PO TBCR
10.0000 meq | EXTENDED_RELEASE_TABLET | Freq: Every day | ORAL | Status: DC
  Administered 2020-09-17 – 2020-09-23 (×7): 10 meq via ORAL
  Filled 2020-09-17 (×7): qty 1

## 2020-09-17 MED ORDER — BISACODYL 10 MG RE SUPP
10.0000 mg | Freq: Every day | RECTAL | Status: DC | PRN
  Filled 2020-09-17: qty 1

## 2020-09-17 MED ORDER — SERTRALINE HCL 50 MG PO TABS
200.0000 mg | ORAL_TABLET | Freq: Every day | ORAL | Status: DC
  Administered 2020-09-17 – 2020-09-22 (×6): 200 mg via ORAL
  Filled 2020-09-17 (×6): qty 4

## 2020-09-17 MED ORDER — QUETIAPINE FUMARATE 25 MG PO TABS
25.0000 mg | ORAL_TABLET | Freq: Two times a day (BID) | ORAL | Status: DC
  Administered 2020-09-17: 25 mg via ORAL
  Filled 2020-09-17: qty 1

## 2020-09-17 MED ORDER — DILTIAZEM HCL 60 MG PO TABS
30.0000 mg | ORAL_TABLET | Freq: Three times a day (TID) | ORAL | Status: DC
  Administered 2020-09-17 – 2020-09-18 (×4): 30 mg via ORAL
  Filled 2020-09-17 (×5): qty 1

## 2020-09-17 MED ORDER — LOSARTAN POTASSIUM-HCTZ 50-12.5 MG PO TABS: 1.0000 | ORAL_TABLET | Freq: Every day | ORAL | Status: DC

## 2020-09-17 MED ORDER — NITROGLYCERIN 0.4 MG/HR TD PT24
0.4000 mg | MEDICATED_PATCH | TRANSDERMAL | Status: DC
  Administered 2020-09-17 – 2020-09-18 (×2): 0.4 mg via TRANSDERMAL
  Filled 2020-09-17 (×2): qty 1

## 2020-09-17 NOTE — BH Assessment (Signed)
Per the request of ER Secretary, Probation officer spoke with [facility] Access staff member ([phone]) about the latest she can arrive. Rosana Hoes said anytime, as long as it is before 7:30am. Probation officer updated ER Secretary Anne Ng).

## 2020-09-17 NOTE — ED Notes (Signed)
Pt resting at this time. Will collect vitals when awake. Pt breakfast tray sitting next to pt.

## 2020-09-17 NOTE — ED Notes (Signed)
Report received from [place], [place] including situation, background, assessment and recommendations. Patient sleeping, respirations regular and unlabored. Q15 minute rounds and security camera observation to continue. Will assess patient once awake.

## 2020-09-17 NOTE — BH Assessment (Signed)
Referral check:  Rosana Hoes ([phone]---[phone]---[phone]), wasn't at his computer. Will call back with status.  Mikel Cella [phone], [phone], [phone] or [phone]), No beds.  [facility] ([place] -or- [phone]), advised to refax.  [facility] ([place]onda-[phone] or [phone]), No one working in admission today. Advised to call back later.  Ellin Mayhew [phone] -or- [phone]), Declined.  .[facility] ([phone] or [phone]), No beds  .[facility] [phone]) Gaspar Bidding requested a refax. Task completed at 6:18 AM. Follow up needed.     Referral information for Psychiatric Hospitalization faxed to;   [facility] ([phone]---[phone]---[phone]),  . Mayer Camel [phone]).

## 2020-09-17 NOTE — ED Notes (Signed)
Attempt to call report spoke with nurse that did not know she was receiving patient. Nurse placed me on hold until she could locate intake personal [place], whom will be the person taking report. As per nurse whom returned back to call states that the intake personal [place] will call us for report when he receives orders from accepting doctor.

## 2020-09-17 NOTE — ED Notes (Signed)
Hourly rounding completed at this time, patient currently asleep in room. No complaints, stable, and in no acute distress. Q15 minute rounds and monitoring via Rover and Officer to continue. 

## 2020-09-17 NOTE — ED Notes (Signed)
Pt asleep at this time, unable to collect vitals. Will collect pt vitals once awake. 

## 2020-09-17 NOTE — ED Notes (Signed)
Hourly rounding completed at this time, patient currently awake in room. No complaints, stable, and in no acute distress. Q15 minute rounds and monitoring via Rover and Officer to continue. °

## 2020-09-17 NOTE — ED Notes (Signed)
Pt given meal tray with ice water

## 2020-09-17 NOTE — ED Notes (Signed)
Assumed care of patient this morning. Patient sleeping comfortably. Patient awaiting placement. Patient here due to SI, patient confused and denied trying to hurt herself. Denies SI/HV. Patient placed in house bed with device used  for fall precautions. Will continue to monitor. Safety maintained.

## 2020-09-17 NOTE — ED Notes (Signed)
Report given to [facility] receiving nurse. awaiting transport to next facility. Son at bedside to visit.

## 2020-09-17 NOTE — BH Assessment (Signed)
Patient has been accepted to [facility].  Patient assigned to [facility] unit Accepting physician is Dr. Lucilla Edin.  Call report to [phone].  Representative was [place].   ER Staff is aware of it:  Rodena Piety, ER Ilsa Iha, Patient's Nurse     Patient's Family/Support System (Ms. [phone] ) have been updated as well.  Address: [address],  [place], [place] [address]

## 2020-09-17 NOTE — Progress Notes (Signed)
PT Cancellation Note  Patient Details Name: EILEY MCGINNITY MRN: [id number] DOB: Jan 16, 1936   Cancelled Treatment:    Reason Eval/Treat Not Completed: Other (comment) (Patient consult received and reviewed. Nursing notified PT that PT services are no longer required as placement has been made. Will be happy to see this patient as needed.)  Janna Arch, PT, DPT   09/17/2020, 2:08 PM

## 2020-09-17 NOTE — ED Notes (Signed)
INVOLUNTARY with acceptance to [facility] in [place] Trying to coordinate transport with EMS and [facility] for transport to [facility] at [facility] and unable to transport as they are 22 transports behind [facility] can transport at [place] but must have [place] to ride or pt be off IVC [facility] called and they have no availability tonight [facility] called and they don't transfer IVC per Nitchia Awaiting call back from [place] about IVC status and pt can go anytime as long as she arrives before 7A on 09/18/2020

## 2020-09-17 NOTE — BH Assessment (Signed)
This Probation officer contacted [employer] and spoke with [employer] Staff, Cletus Gash at [phone] who confirms that patient is still able to be accepted to [facility].  Based on previous documentation from nurse and secretary there may be some transportation issues due to the holiday tomorrow.  Writer will communicate this to day shift TTS tomorrow to follow up with Day shift Network engineer and [employer]

## 2020-09-17 NOTE — Consult Note (Signed)
[facility] Consult   Reason for Consult:  Psychosis, paranoia Referring Physician:  EDP Patient Identification: Cindy Fitzpatrick MRN:  [id number] Principal Diagnosis: Bipolar affective disorder, mixed, severe, with psychotic behavior ([place]) Diagnosis:  Principal Problem:   Bipolar affective disorder, mixed, severe, with psychotic behavior ([place])   Total Time spent with patient: 1 hour  Subjective:   Cindy Fitzpatrick is a 84 y.o. female patient admitted with paranoia, psychosis.  Patient seen and evaluated in person by this provider and TTS.  During the conversation she perseverates on a man in her SNF that continually states, "get rid of Allexus Ovens.  Get rid of Janann Boeve."  She finds this very disturbing especially since she feels no one is do anything about it.  Paranoid that people are after her at her nursing facility.  Facility contacted along with family and these are delusions with no substance.  Client does report a history of bipolar disorder and her daughter-in-law supplemented this with borderline personality disorder and anxiety.  Family communicates, [address], that the client has progressively become more delusional with an increase in hallucinations and threats to self-harm.  Prior to arriving to the emergency department she had obtained a pair scissors and held onto her stomach and threatened to kill herself.  She minimizes his actions and states she is upset because of a man tormenting her at the facility.  She is very hard of hearing and requires bilateral hearing aids and increased volume to be heard.  She continues to ramble about the situation in her nursing facility.  Yesterday after the team spoke with her, she became upset and had to have medications.  She was taking Seroquel which was not effective and changed to Risperdal 0.5 mg twice daily, family aware.  This medication does seem to have helped as she is remained calm for the past 24 hours.  Continues to  meet inpatient hospitalization and accepted to [facility] by Dr. Harrel Lemon and will hopefully transport today.  HPI per admission by Rashaun Dixon: Cindy Fitzpatrick is a 84 y.o. female patient admitted with depression and suicide attempt and plan.  Per Er nurse,pt stated she wanted to kill herself because "life isn't worth living anymore." Pt stated her family "doesn't care about 65 year-olds." Pt very depressed during talk. Pt calm and cooperative w/staff. Pt also confused at baseline and thinks she has an apartment here in this building. Pt endorses "pain all over," which is chronic.   HPI:  Cindy Fitzpatrick, 84 y.o., female patient seen face to face by this provider; chart reviewed and consulted with Dr. Dwyane Dee on 09/15/20.  On evaluation Cindy Fitzpatrick reports that she is here because "because I cant stand living anymore.  I am 84 years old and have been miserable since my husband died. I once had a lot of friends, but then [facility] bought our apartments and it got worst."  She states that it went from being a place for retirees to a place for "making money".  Patient also states that there was a lot of teasing going on as well. Patient has the tendency to "ramble" and change topics often.  However, she still endorses suicidal thoughts with a plan to stab herself.  She states she has a psychiatric diagnosis of "manic depressive".  She says she is not currently taking any medications for her mental health.  She attributes all of her depression on the loss of her husband and the new living situation.  At this time she states that she is being harassed by someone at he living facility that states that the complex would be better if she were not there.  She has 5 children but wasn't able to state where they are at this time.     During evaluation Cindy Fitzpatrick is laying in bed on approach and is very engaging on approach; she is alert/oriented x 4; calm/cooperative/depressed; and mood congruent with affect.   Patient is speaking in a clear tone at moderate volume, and normal pace;  Pt has difficulty hearing so writer had speak slower and at an elevated volume.  She has good eye contact and often raised up out of the bed to ensure she was making eye contact. Her thought process is at times incoherent and tangential.  There is no indication that she is currently responding to internal/external stimuli or experiencing delusional thought content.  Patient denies homicidal ideation, psychosis, and paranoia.  Patient has remained calm throughout assessment and has answered questions appropriately.   Past Psychiatric History: bipolar d/o  Risk to Self: Suicidal Ideation: Yes-Currently Present Suicidal Intent: Yes-Currently Present Is patient at risk for suicide?: Yes Suicidal Plan?: Yes-Currently Present Specify Current Suicidal Plan:  (attempting to cut herself ) Access to Means: Yes Specify Access to Suicidal Means:  (was able to get ahold of a pair of scissors) Intentional Self Injurious Behavior: Cutting Risk to Others: Homicidal Ideation: No Thoughts of Harm to Others: No Current Homicidal Intent: No Current Homicidal Plan: No Access to Homicidal Means: No Criminal Charges Pending?: No Does patient have a court date: No Prior Inpatient Therapy:   Prior Outpatient Therapy:    Past Medical History:  Past Medical History:  Diagnosis Date  . Arthritis   . Blood in stool   . Cancer ([place])    skin  . Chicken pox    SHINGLES TWICE  . Colon polyps   . Depression   . Diverticulitis   . GERD (gastroesophageal reflux disease)   . Hay fever   . Heart disease   . Heart murmur   . Hyperlipidemia   . UTI (urinary tract infection)   . Varicose veins of both lower extremities     Past Surgical History:  Procedure Laterality Date  . HERNIA REPAIR    . KNEE SURGERY    . ORIF ANKLE FRACTURE Right 05/22/2018   Procedure: OPEN REDUCTION INTERNAL FIXATION (ORIF) TRIMALLEOLAR  ANKLE FRACTURE;   Surgeon: Leim Fabry, MD;  Location: [facility] ORS;  Service: Orthopedics;  Laterality: Right;   Family History:  Family History  Problem Relation Age of Onset  . Stroke Mother   . Heart attack Father    Family Psychiatric  History: none Social History:  Social History   Substance and Sexual Activity  Alcohol Use No  . Alcohol/week: 0.0 standard drinks     Social History   Substance and Sexual Activity  Drug Use No    Social History   Socioeconomic History  . Marital status: Widowed    Spouse name: Not on file  . Number of children: Not on file  . Years of education: Not on file  . Highest education level: Not on file  Occupational History  . Not on file  Tobacco Use  . Smoking status: Never Smoker  . Smokeless tobacco: Never Used  Vaping Use  . Vaping Use: Never used  Substance and Sexual Activity  . Alcohol use: No    Alcohol/week: 0.0 standard drinks  .  Drug use: No  . Sexual activity: Never  Other Topics Concern  . Not on file  Social History Narrative  . Not on file   Social Determinants of Health   Financial Resource Strain:   . Difficulty of Paying Living Expenses: Not on file  Food Insecurity:   . Worried About Charity fundraiser in the Last Year: Not on file  . Ran Out of Food in the Last Year: Not on file  Transportation Needs:   . Lack of Transportation (Medical): Not on file  . Lack of Transportation (Non-Medical): Not on file  Physical Activity:   . Days of Exercise per Week: Not on file  . Minutes of Exercise per Session: Not on file  Stress:   . Feeling of Stress : Not on file  Social Connections:   . Frequency of Communication with Friends and Family: Not on file  . Frequency of Social Gatherings with Friends and Family: Not on file  . Attends Religious Services: Not on file  . Active Member of Clubs or Organizations: Not on file  . Attends Archivist Meetings: Not on file  . Marital Status: Not on file   Additional Social  History:    Allergies:   Allergies  Allergen Reactions  . Codeine Other (See Comments)    Other reaction(s): Headache  . Hydrocodone-Acetaminophen Nausea And Vomiting  . Meperidine     Other reaction(s): Dizziness  . Niacin Er Other (See Comments)    Patient doesn't remember  . Other Nausea Only  . Propoxyphene Other (See Comments)    Other reaction(s): Dizziness  . Solifenacin Other (See Comments)    Other reaction(s): Abdominal Pain  . Statins     Other reaction(s): Muscle Pain Weakness  . Sulfa Antibiotics Other (See Comments)    Patient doesn' t remember  . Fish-Derived Products Rash    Weakness  . Nabumetone Rash  . Oxycodone-Acetaminophen Rash    Couldn't think or remember anything    Labs:  Results for orders placed or performed during the hospital encounter of 09/14/20 (from the past 48 hour(s))  Resp Panel by RT-PCR (Flu A&B, Covid) Nasopharyngeal Swab     Status: None   Collection Time: 09/16/20  7:16 PM   Specimen: Nasopharyngeal Swab; Nasopharyngeal(NP) swabs in vial transport medium  Result Value Ref Range   SARS Coronavirus 2 by RT PCR NEGATIVE NEGATIVE    Comment: (NOTE) SARS-CoV-2 target nucleic acids are NOT DETECTED.  The SARS-CoV-2 RNA is generally detectable in upper respiratory specimens during the acute phase of infection. The lowest concentration of SARS-CoV-2 viral copies this assay can detect is 138 copies/mL. A negative result does not preclude SARS-Cov-2 infection and should not be used as the sole basis for treatment or other patient management decisions. A negative result may occur with  improper specimen collection/handling, submission of specimen other than nasopharyngeal swab, presence of viral mutation(s) within the areas targeted by this assay, and inadequate number of viral copies(<138 copies/mL). A negative result must be combined with clinical observations, patient history, and epidemiological information. The expected result is  Negative.  Fact Sheet for Patients:  EntrepreneurPulse.com.au  Fact Sheet for Healthcare Providers:  IncredibleEmployment.be  This test is no t yet approved or cleared by the [place] FDA and  has been authorized for detection and/or diagnosis of SARS-CoV-2 by FDA under an Emergency Use Authorization (EUA). This EUA will remain  in effect (meaning this test can be used) for the duration  of the COVID-19 declaration under Section 564(b)(1) of the Act, 21 U.S.C.section 360bbb-3(b)(1), unless the authorization is terminated  or revoked sooner.       Influenza A by PCR NEGATIVE NEGATIVE   Influenza B by PCR NEGATIVE NEGATIVE    Comment: (NOTE) The Xpert Xpress SARS-CoV-2/FLU/RSV plus assay is intended as an aid in the diagnosis of influenza from Nasopharyngeal swab specimens and should not be used as a sole basis for treatment. Nasal washings and aspirates are unacceptable for Xpert Xpress SARS-CoV-2/FLU/RSV testing.  Fact Sheet for Patients: EntrepreneurPulse.com.au  Fact Sheet for Healthcare Providers: IncredibleEmployment.be  This test is not yet approved or cleared by the [place] FDA and has been authorized for detection and/or diagnosis of SARS-CoV-2 by FDA under an Emergency Use Authorization (EUA). This EUA will remain in effect (meaning this test can be used) for the duration of the COVID-19 declaration under Section 564(b)(1) of the Act, 21 U.S.C. section 360bbb-3(b)(1), unless the authorization is terminated or revoked.  Performed at [facility], [address] [place], [place] [address]     Current Facility-Administered Medications  Medication Dose Route Frequency Provider Last Rate Last Admin  . acetaminophen (TYLENOL) tablet 650 mg  650 mg Oral Q6H PRN Paulette Blanch, MD   650 mg at 09/16/20 0136  . alum & mag hydroxide-simeth (MAALOX/MYLANTA) 200-200-20 MG/5ML  suspension 30 mL  30 mL Oral Q4H PRN Duffy Bruce, MD      . bisacodyl (DULCOLAX) suppository 10 mg  10 mg Rectal Daily PRN Duffy Bruce, MD      . buPROPion [facility] SR) 12 hr tablet 150 mg  150 mg Oral Daily Duffy Bruce, MD   150 mg at 09/17/20 1024  . cloNIDine (CATAPRES) tablet 0.1 mg  0.1 mg Oral BID Duffy Bruce, MD   0.1 mg at 09/17/20 1024  . dicyclomine (BENTYL) capsule 10 mg  10 mg Oral Once Arta Silence, MD      . diltiazem (CARDIZEM) tablet 30 mg  30 mg Oral TID Duffy Bruce, MD   30 mg at 09/17/20 1024  . losartan (COZAAR) tablet 50 mg  50 mg Oral Daily Duffy Bruce, MD   50 mg at 09/17/20 1024   And  . hydrochlorothiazide (MICROZIDE) capsule 12.5 mg  12.5 mg Oral Daily Duffy Bruce, MD   12.5 mg at 09/17/20 1024  . nitroGLYCERIN (NITRODUR - Dosed in mg/24 hr) patch 0.4 mg  0.4 mg Transdermal Q24H Duffy Bruce, MD   0.4 mg at 09/17/20 1025  . polyethylene glycol (MIRALAX / GLYCOLAX) packet 17 g  17 g Oral Daily Duffy Bruce, MD   17 g at 09/17/20 1023  . polyvinyl alcohol (LIQUIFILM TEARS) 1.4 % ophthalmic solution 2 drop  2 drop Right Eye BID PRN Duffy Bruce, MD      . potassium chloride SA (KLOR-CON) CR tablet 10 mEq  10 mEq Oral Daily Duffy Bruce, MD   10 mEq at 09/17/20 1024  . QUEtiapine (SEROQUEL) tablet 25 mg  25 mg Oral BID Duffy Bruce, MD   25 mg at 09/17/20 1025  . risperiDONE (RISPERDAL) tablet 0.5 mg  0.5 mg Oral BID Patrecia Pour, NP   0.5 mg at 09/17/20 1025  . sertraline (ZOLOFT) tablet 200 mg  200 mg Oral QHS Duffy Bruce, MD      . traMADol Veatrice Bourbon) tablet 50 mg  50 mg Oral Once Arta Silence, MD       Current Outpatient Medications  Medication Sig Dispense  Refill  . acetaminophen (TYLENOL) 325 MG tablet Take 650 mg by mouth every 8 (eight) hours as needed for mild pain.    Marland Kitchen alum & mag hydroxide-simeth (MAALOX PLUS) 400-400-40 MG/5ML suspension Take 30 mLs by mouth every 4 (four) hours as needed for  indigestion.     . bisacodyl (DULCOLAX) 10 MG suppository Place 10 mg rectally daily as needed for moderate constipation.    Marland Kitchen buPROPion (WELLBUTRIN SR) 150 MG 12 hr tablet Take 150 mg by mouth daily.    . carboxymethylcellulose (REFRESH PLUS) 0.5 % SOLN Place 2 drops into the right eye 2 (two) times daily as needed (redness).    . cloNIDine (CATAPRES) 0.1 MG tablet TAKE 1 TABLET BY MOUTH TWICE DAILY (Patient taking differently: Take 0.1 mg by mouth 2 (two) times daily. ) 180 tablet 0  . diltiazem (CARDIZEM) 30 MG tablet TAKE ONE TABLET 3 TIMES DAILY (Patient taking differently: Take 30 mg by mouth 3 (three) times daily. ) 90 tablet 1  . losartan-hydrochlorothiazide (HYZAAR) 50-12.5 MG tablet TAKE ONE TABLET EVERY DAY (Patient taking differently: Take 1 tablet by mouth daily. ) 30 tablet 2  . nitroGLYCERIN (NITRODUR - DOSED IN MG/24 HR) 0.4 mg/hr patch APPLY 1 PATCH DAILY.LEAVE ON FOR 12-14 HOURS & REMOVE FOR 10-12 HOURS BEFORE APPLYING THE NEXT PATCH (Patient taking differently: Place 0.4 mg onto the skin daily. (remove after 12 hours and apply new patch after additional 12 hours)) 30 patch 1  . polyethylene glycol (MIRALAX / GLYCOLAX) 17 g packet Take 17 g by mouth daily.    . potassium chloride (KLOR-CON) 10 MEQ tablet TAKE ONE TABLET EVERY DAY (Patient taking differently: Take 10 mEq by mouth daily. ) 90 tablet 0  . QUEtiapine (SEROQUEL) 25 MG tablet Take 25 mg by mouth 2 (two) times daily.    . sertraline (ZOLOFT) 100 MG tablet TAKE TWO TABLETS AT BEDTIME (Patient taking differently: Take 200 mg by mouth daily. ) 60 tablet 3    Musculoskeletal: Strength & Muscle Tone: decreased Gait & Station: did not witness Patient leans: N/A  Psychiatric Specialty Exam: Physical Exam Vitals and nursing note reviewed.  Constitutional:      Appearance: Normal appearance.  HENT:     Head: Normocephalic.     Nose: Nose normal.  Pulmonary:     Effort: Pulmonary effort is normal.  Musculoskeletal:         General: Normal range of motion.     Cervical back: Normal range of motion.  Neurological:     General: No focal deficit present.     Mental Status: She is alert and oriented to person, place, and time.  Psychiatric:        Attention and Perception: She perceives auditory and visual hallucinations.        Mood and Affect: Mood is anxious and depressed.        Speech: Speech normal.        Behavior: Behavior normal. Behavior is cooperative.        Thought Content: Thought content is paranoid and delusional.        Cognition and Memory: Cognition is impaired.        Judgment: Judgment is impulsive.     Review of Systems  Psychiatric/Behavioral: Positive for confusion, dysphoric mood and hallucinations. The patient is nervous/anxious.   All other systems reviewed and are negative.   Blood pressure (!) 137/108, pulse 89, temperature 98.2 F (36.8 C), temperature source Oral, resp. rate 20, SpO2  100 %.There is no height or weight on file to calculate BMI.  General Appearance: Casual  Eye Contact:  Good  Speech:  Normal Rate  Volume:  Normal  Mood:  Anxious, Depressed and Irritable  Affect:  Congruent  Thought Process:  Coherent and Descriptions of Associations: Circumstantial  Orientation:  Other:  person andplace  Thought Content:  Delusions, Hallucinations: Auditory Visual and Paranoid Ideation  Suicidal Thoughts:  No  Homicidal Thoughts:  No  Memory:  Immediate;   Fair Recent;   Poor Remote;   Fair  Judgement:  Impaired  Insight:  Lacking  Psychomotor Activity:  Decreased  Concentration:  Concentration: Fair and Attention Span: Fair  Recall:  [employer] of Knowledge:  Good  Language:  Good  Akathisia:  No  Handed:  Right  AIMS (if indicated):     Assets:  Housing Leisure Time Resilience Social Support  ADL's:  Impaired  Cognition:  Impaired,  Moderate  Sleep:        Treatment Plan Summary: Daily contact with patient to assess and evaluate symptoms and  progress in treatment, Medication management and Plan bipolar affective disorder, mixed, severe with psychosis:  -Continue Zoloft 200  Mg daily -Continue Wellbutrin 150 mg daily -Discontinued Seroquel -Continue Risperdal 0.5 mg BID  Admit and transfer to [facility] geriatric psych  Disposition: Recommend psychiatric Inpatient admission when medically cleared.  Waylan Boga, NP 09/17/2020 11:48 AM

## 2020-09-17 NOTE — ED Provider Notes (Signed)
Emergency Medicine Observation Re-evaluation Note  Cindy Fitzpatrick is a 84 y.o. female, seen on rounds today.  Pt initially presented to the ED for complaints of Psychiatric Evaluation (SI attempt) Currently, the patient is resting.  Physical Exam  BP (!) 165/56   Pulse 81   Temp 97.7 F (36.5 C) (Oral)   Resp 16   SpO2 100%  Physical Exam Gen:  No acute distress Resp:  Breathing easily and comfortably, no accessory muscle usage Neuro:  Moving all four extremities, no gross focal neuro deficits Psych:  Resting currently, calm and cooperative when awake ED Course / MDM  EKG:   I have reviewed the labs performed to date as well as medications administered while in observation.  Recent changes in the last 24 hours include continued placement attempts.  Plan  Current plan is for geropsych placement. Patient is under full IVC at this time.  Of note, the patient's regular blood pressure medicines and home meds other than psychiatry related medications have not been ordered.  The day team will look into this to see if she needs to have additional medications ordered.   Hinda Kehr, MD 09/17/20 [phone]

## 2020-09-17 NOTE — ED Notes (Signed)
Pt is connected to purewick suctioning and tv on in room. Pt is grogy due to be woken up for room change. State she is returning to sleep

## 2020-09-17 NOTE — BH Assessment (Signed)
Referral check:  Cindy Fitzpatrick [phone]---[phone]) Cindy Fitzpatrick of [facility] requested a re-fax task completed at 6:03 AM. Follow up with Cindy Fitzpatrick needed.   Cindy Fitzpatrick [phone], [phone], [phone] or [phone]) Cindy Fitzpatrick advised TTS to call back at 8:30AM and ask for Cindy Fitzpatrick.   [facility] (-[phone] -or- [phone], [phone]) Pt currently under review. Follow up needed.  Cindy Fitzpatrick [phone] or [phone]), No answer  .Cindy Fitzpatrick [phone] -or- [phone]), No gero bed availability at this time.  .Cindy Fitzpatrick [phone] or [phone]) Pt currently under review. Follow up needed.  Cindy Fitzpatrick [phone]) Cindy Fitzpatrick requested a refax. Task completed at 6:18 AM. Follow up needed.

## 2020-09-17 NOTE — ED Notes (Signed)
Pt is finishing her snack tray and water cup at this time, pt ate 100% of dinner and snack tray. Cleared from room at this time

## 2020-09-17 NOTE — ED Notes (Signed)
Pt provided with cups of water, pt HOB adjusted for comfort but requests not to be rolled. Pt is confused and believes she is still at a facility and that her medications are wrong. Pt is updated on plan of care and where she is where she expressed appreciation. Lights dimmed in room and will continue to monitor.

## 2020-09-17 NOTE — ED Notes (Signed)
Secretary Anne Ng received call  Angie at was advised that they will not be able to accept patient due to patient not being IVC'd and transportation issues.  Will have to reevaluate patient on Monday. Call received from [place] at [phone]

## 2020-09-17 NOTE — ED Notes (Signed)
Patient transferred from room 26 to room 21 after dressing out and screening for contraband. Report received from [facility], RN including situation, background, assessment and recommendations. Pt oriented to [employer] including Q15 minute rounds as well as Engineer, drilling for their protection. Patient is alert and oriented, warm and dry in no acute distress. Patient denies SI, HI, and AVH. Pt. Encouraged to let this nurse know if needs arise.

## 2020-09-18 ENCOUNTER — Inpatient Hospital Stay: Payer: Medicare Other

## 2020-09-18 ENCOUNTER — Emergency Department: Payer: Medicare Other

## 2020-09-18 ENCOUNTER — Encounter: Payer: Self-pay | Admitting: Emergency Medicine

## 2020-09-18 DIAGNOSIS — R45851 Suicidal ideations: Secondary | ICD-10-CM | POA: Diagnosis present

## 2020-09-18 DIAGNOSIS — E860 Dehydration: Secondary | ICD-10-CM | POA: Diagnosis present

## 2020-09-18 DIAGNOSIS — I2510 Atherosclerotic heart disease of native coronary artery without angina pectoris: Secondary | ICD-10-CM | POA: Diagnosis present

## 2020-09-18 DIAGNOSIS — M1990 Unspecified osteoarthritis, unspecified site: Secondary | ICD-10-CM | POA: Diagnosis present

## 2020-09-18 DIAGNOSIS — R1312 Dysphagia, oropharyngeal phase: Secondary | ICD-10-CM | POA: Diagnosis not present

## 2020-09-18 DIAGNOSIS — Z90710 Acquired absence of both cervix and uterus: Secondary | ICD-10-CM | POA: Diagnosis not present

## 2020-09-18 DIAGNOSIS — M549 Dorsalgia, unspecified: Secondary | ICD-10-CM | POA: Diagnosis present

## 2020-09-18 DIAGNOSIS — K429 Umbilical hernia without obstruction or gangrene: Secondary | ICD-10-CM | POA: Diagnosis present

## 2020-09-18 DIAGNOSIS — Z79899 Other long term (current) drug therapy: Secondary | ICD-10-CM | POA: Diagnosis not present

## 2020-09-18 DIAGNOSIS — R1310 Dysphagia, unspecified: Secondary | ICD-10-CM | POA: Diagnosis present

## 2020-09-18 DIAGNOSIS — K219 Gastro-esophageal reflux disease without esophagitis: Secondary | ICD-10-CM | POA: Diagnosis present

## 2020-09-18 DIAGNOSIS — X789XXA Intentional self-harm by unspecified sharp object, initial encounter: Secondary | ICD-10-CM | POA: Diagnosis present

## 2020-09-18 DIAGNOSIS — K449 Diaphragmatic hernia without obstruction or gangrene: Secondary | ICD-10-CM | POA: Diagnosis present

## 2020-09-18 DIAGNOSIS — Z85828 Personal history of other malignant neoplasm of skin: Secondary | ICD-10-CM | POA: Diagnosis not present

## 2020-09-18 DIAGNOSIS — I9589 Other hypotension: Secondary | ICD-10-CM | POA: Diagnosis not present

## 2020-09-18 DIAGNOSIS — E869 Volume depletion, unspecified: Secondary | ICD-10-CM | POA: Diagnosis present

## 2020-09-18 DIAGNOSIS — E861 Hypovolemia: Secondary | ICD-10-CM

## 2020-09-18 DIAGNOSIS — D649 Anemia, unspecified: Secondary | ICD-10-CM | POA: Diagnosis present

## 2020-09-18 DIAGNOSIS — I10 Essential (primary) hypertension: Secondary | ICD-10-CM | POA: Diagnosis present

## 2020-09-18 DIAGNOSIS — S31119A Laceration without foreign body of abdominal wall, unspecified quadrant without penetration into peritoneal cavity, initial encounter: Secondary | ICD-10-CM | POA: Diagnosis present

## 2020-09-18 DIAGNOSIS — R109 Unspecified abdominal pain: Secondary | ICD-10-CM | POA: Diagnosis present

## 2020-09-18 DIAGNOSIS — Z20822 Contact with and (suspected) exposure to covid-19: Secondary | ICD-10-CM | POA: Diagnosis present

## 2020-09-18 DIAGNOSIS — X788XXA Intentional self-harm by other sharp object, initial encounter: Secondary | ICD-10-CM | POA: Diagnosis present

## 2020-09-18 DIAGNOSIS — E872 Acidosis: Secondary | ICD-10-CM | POA: Diagnosis present

## 2020-09-18 DIAGNOSIS — F3164 Bipolar disorder, current episode mixed, severe, with psychotic features: Secondary | ICD-10-CM | POA: Diagnosis present

## 2020-09-18 DIAGNOSIS — K5730 Diverticulosis of large intestine without perforation or abscess without bleeding: Secondary | ICD-10-CM | POA: Diagnosis present

## 2020-09-18 DIAGNOSIS — I959 Hypotension, unspecified: Secondary | ICD-10-CM | POA: Diagnosis present

## 2020-09-18 DIAGNOSIS — E871 Hypo-osmolality and hyponatremia: Secondary | ICD-10-CM | POA: Diagnosis present

## 2020-09-18 DIAGNOSIS — Y92009 Unspecified place in unspecified non-institutional (private) residence as the place of occurrence of the external cause: Secondary | ICD-10-CM | POA: Diagnosis not present

## 2020-09-18 DIAGNOSIS — R1031 Right lower quadrant pain: Secondary | ICD-10-CM | POA: Diagnosis not present

## 2020-09-18 DIAGNOSIS — H9190 Unspecified hearing loss, unspecified ear: Secondary | ICD-10-CM | POA: Diagnosis present

## 2020-09-18 DIAGNOSIS — F419 Anxiety disorder, unspecified: Secondary | ICD-10-CM | POA: Diagnosis present

## 2020-09-18 LAB — CBC
HCT: 29.9 % — ABNORMAL LOW (ref 36.0–46.0)
Hemoglobin: 9.2 g/dL — ABNORMAL LOW (ref 12.0–15.0)
MCH: 24.2 pg — ABNORMAL LOW (ref 26.0–34.0)
MCHC: 30.8 g/dL (ref 30.0–36.0)
MCV: 78.7 fL — ABNORMAL LOW (ref 80.0–100.0)
Platelets: 192 10*3/uL (ref 150–400)
RBC: 3.8 MIL/uL — ABNORMAL LOW (ref 3.87–5.11)
RDW: 15.8 % — ABNORMAL HIGH (ref 11.5–15.5)
WBC: 10.6 10*3/uL — ABNORMAL HIGH (ref 4.0–10.5)
nRBC: 0 % (ref 0.0–0.2)

## 2020-09-18 LAB — URINALYSIS, COMPLETE (UACMP) WITH MICROSCOPIC
Bilirubin Urine: NEGATIVE
Glucose, UA: 50 mg/dL — AB
Hgb urine dipstick: NEGATIVE
Ketones, ur: NEGATIVE mg/dL
Leukocytes,Ua: NEGATIVE
Nitrite: NEGATIVE
Protein, ur: NEGATIVE mg/dL
Specific Gravity, Urine: 1.021 (ref 1.005–1.030)
pH: 5 (ref 5.0–8.0)

## 2020-09-18 LAB — CBC WITH DIFFERENTIAL/PLATELET
Abs Immature Granulocytes: 0.03 10*3/uL (ref 0.00–0.07)
Basophils Absolute: 0 10*3/uL (ref 0.0–0.1)
Basophils Relative: 0 %
Eosinophils Absolute: 0.1 10*3/uL (ref 0.0–0.5)
Eosinophils Relative: 1 %
HCT: 33.7 % — ABNORMAL LOW (ref 36.0–46.0)
Hemoglobin: 10.1 g/dL — ABNORMAL LOW (ref 12.0–15.0)
Immature Granulocytes: 0 %
Lymphocytes Relative: 9 %
Lymphs Abs: 1.1 10*3/uL (ref 0.7–4.0)
MCH: 24 pg — ABNORMAL LOW (ref 26.0–34.0)
MCHC: 30 g/dL (ref 30.0–36.0)
MCV: 80 fL (ref 80.0–100.0)
Monocytes Absolute: 1.3 10*3/uL — ABNORMAL HIGH (ref 0.1–1.0)
Monocytes Relative: 11 %
Neutro Abs: 9 10*3/uL — ABNORMAL HIGH (ref 1.7–7.7)
Neutrophils Relative %: 79 %
Platelets: 202 10*3/uL (ref 150–400)
RBC: 4.21 MIL/uL (ref 3.87–5.11)
RDW: 15.8 % — ABNORMAL HIGH (ref 11.5–15.5)
WBC: 11.5 10*3/uL — ABNORMAL HIGH (ref 4.0–10.5)
nRBC: 0 % (ref 0.0–0.2)

## 2020-09-18 LAB — COMPREHENSIVE METABOLIC PANEL
ALT: 10 U/L (ref 0–44)
AST: 19 U/L (ref 15–41)
Albumin: 3.4 g/dL — ABNORMAL LOW (ref 3.5–5.0)
Alkaline Phosphatase: 59 U/L (ref 38–126)
Anion gap: 9 (ref 5–15)
BUN: 29 mg/dL — ABNORMAL HIGH (ref 8–23)
CO2: 24 mmol/L (ref 22–32)
Calcium: 8.6 mg/dL — ABNORMAL LOW (ref 8.9–10.3)
Chloride: 105 mmol/L (ref 98–111)
Creatinine, Ser: 0.92 mg/dL (ref 0.44–1.00)
GFR, Estimated: >60 mL/min (ref >60)
Glucose, Bld: 223 mg/dL — ABNORMAL HIGH (ref 70–99)
Potassium: 4 mmol/L (ref 3.5–5.1)
Sodium: 138 mmol/L (ref 135–145)
Total Bilirubin: 0.6 mg/dL (ref 0.3–1.2)
Total Protein: 6.2 g/dL — ABNORMAL LOW (ref 6.5–8.1)

## 2020-09-18 LAB — BASIC METABOLIC PANEL
Anion gap: 8 (ref 5–15)
BUN: 23 mg/dL (ref 8–23)
CO2: 24 mmol/L (ref 22–32)
Calcium: 8.4 mg/dL — ABNORMAL LOW (ref 8.9–10.3)
Chloride: 102 mmol/L (ref 98–111)
Creatinine, Ser: 0.8 mg/dL (ref 0.44–1.00)
GFR, Estimated: >60 mL/min (ref >60)
Glucose, Bld: 202 mg/dL — ABNORMAL HIGH (ref 70–99)
Potassium: 3.7 mmol/L (ref 3.5–5.1)
Sodium: 134 mmol/L — ABNORMAL LOW (ref 135–145)

## 2020-09-18 LAB — LACTIC ACID, PLASMA: Lactic Acid, Venous: 2.1 mmol/L (ref 0.5–1.9)

## 2020-09-18 LAB — TROPONIN I (HIGH SENSITIVITY)
Troponin I (High Sensitivity): 13 ng/L (ref <18)
Troponin I (High Sensitivity): 16 ng/L (ref <18)

## 2020-09-18 MED ORDER — ACETAMINOPHEN 650 MG RE SUPP: 650.0000 mg | Freq: Four times a day (QID) | RECTAL | Status: DC | PRN

## 2020-09-18 MED ORDER — LACTATED RINGERS IV BOLUS
1000.0000 mL | Freq: Once | INTRAVENOUS | Status: AC
  Administered 2020-09-18: 1000 mL via INTRAVENOUS

## 2020-09-18 MED ORDER — TRAZODONE HCL 50 MG PO TABS
25.0000 mg | ORAL_TABLET | Freq: Every evening | ORAL | Status: DC | PRN
  Administered 2020-09-21: 25 mg via ORAL
  Filled 2020-09-18: qty 1

## 2020-09-18 MED ORDER — MAGNESIUM HYDROXIDE 400 MG/5ML PO SUSP: 30.0000 mL | Freq: Every day | ORAL | Status: DC | PRN

## 2020-09-18 MED ORDER — ACETAMINOPHEN 325 MG PO TABS
650.0000 mg | ORAL_TABLET | Freq: Four times a day (QID) | ORAL | Status: DC | PRN
  Administered 2020-09-22: 650 mg via ORAL
  Filled 2020-09-18: qty 2

## 2020-09-18 MED ORDER — ENOXAPARIN SODIUM 40 MG/0.4ML ~~LOC~~ SOLN: 40.0000 mg | Freq: Every day | SUBCUTANEOUS | Status: DC

## 2020-09-18 MED ORDER — SODIUM CHLORIDE 0.9 % IV BOLUS
500.0000 mL | Freq: Once | INTRAVENOUS | Status: AC
  Administered 2020-09-18: 500 mL via INTRAVENOUS

## 2020-09-18 MED ORDER — ONDANSETRON HCL 4 MG PO TABS: 4.0000 mg | ORAL_TABLET | Freq: Four times a day (QID) | ORAL | Status: DC | PRN

## 2020-09-18 MED ORDER — IOHEXOL 300 MG/ML  SOLN
100.0000 mL | Freq: Once | INTRAMUSCULAR | Status: AC | PRN
  Administered 2020-09-18: 100 mL via INTRAVENOUS

## 2020-09-18 MED ORDER — SODIUM CHLORIDE 0.9 % IV SOLN: INTRAVENOUS | Status: DC

## 2020-09-18 MED ORDER — ONDANSETRON HCL 4 MG/2ML IJ SOLN: 4.0000 mg | Freq: Four times a day (QID) | INTRAMUSCULAR | Status: DC | PRN

## 2020-09-18 NOTE — Progress Notes (Signed)
Client has legs and "Can walk with a good walker and someone, if I have enough energy."  Pleasantly sitting upright in bed and eating an orange.  Waylan Boga, PMHNP

## 2020-09-18 NOTE — ED Notes (Signed)
Pt's right thumb/ hand is swollen and tender to the touch.  RN will make EDP aware.

## 2020-09-18 NOTE — ED Notes (Signed)
Hourly rounding completed at this time, patient currently asleep in room. No complaints, stable, and in no acute distress. Q15 minute rounds and monitoring via Rover and Officer to continue. 

## 2020-09-18 NOTE — ED Notes (Signed)
Pt sleeping at this time.

## 2020-09-18 NOTE — ED Notes (Signed)
Transportation, [employer] arrived to ED to transport patient. This nurse was informed at shift change that pt was not going to [facility] where she was accepted due to transportation issues and law enforcement needing to go with. This nurse speaks to TTS, Network engineer and charge nurse for advice and to gather more information. After this and reviewing notes in chart, this nurse calls [facility], speaking to [place], [place]. Gwyndolyn Saxon informed this nurse that he was told pt was not coming tonight and there was a required reevaluation of the pt 11/25 due to pt limited mobility and resources at [facility]. With this, transport team was sent away at this time. Pt to remain in ED through night.

## 2020-09-18 NOTE — ED Provider Notes (Signed)
Emergency Medicine Observation Re-evaluation Note  Cindy Fitzpatrick is a 84 y.o. female, seen on rounds today.  Pt initially presented to the ED for complaints of Psychiatric Evaluation (SI attempt) Currently, the patient is resting but noted to be hypotensive.  Patient does report she is having for the last couple of days some abdominal pain in the mid to right lower abdomen.  Decreased appetite, does not have any appetite and noted by nursing to have poor oral intake.  Physical Exam  BP (!) 83/58   Pulse 78   Temp 98.1 F (36.7 C) (Oral)   Resp 18   SpO2 100%  Physical Exam General: Alert, somewhat disoriented, no acute distress and pleasant. Cardiac: She has a moderate systolic ejection murmur, regular rate and rhythm Abdomen: Patient reports moderate tenderness suprapubically and some in the right lower quadrant.  Appears to have a likely large right periumbilical hernia that is soft nontender.  Some mild diffuse tenderness throughout. Lungs: Clear to auscultation bilateral.  No wheezing.  No respiratory distress. Psych: Calm, slightly disoriented    ED Course / MDM  EKG:  I have reviewed the labs performed to date as well as medications administered while in observation.  Recent changes in the last 24 hours include development of hypotension, very little appetite.  Plan  Current plan is for fluid resuscitation, suspect some element of dehydration, but also having abdominal pain with nausea and noted by nursing staff to have severe coughing after drinking thin liquids.  I am concerned and nursing staff concerned that she may need a speech pathology consult and that she could potentially be at risk for aspiration.  Currently clear lung sounds.  Do not see evidence of obvious aspiration at this time, but will make her n.p.o. and have ordered speech consult. Patient is under full IVC at this time.  ----------------------------------------- 10:05 PM on  09/18/2020 -----------------------------------------  Blood pressure improving, 112/59.  CT ABDOMEN PELVIS W CONTRAST  Result Date: 09/18/2020 CLINICAL DATA:  84 year old with acute generalized abdominal pain. EXAM: CT ABDOMEN AND PELVIS WITH CONTRAST TECHNIQUE: Multidetector CT imaging of the abdomen and pelvis was performed using the standard protocol following bolus administration of intravenous contrast. CONTRAST:  133mL OMNIPAQUE IOHEXOL 300 MG/ML IV. COMPARISON:  04/22/2016. FINDINGS: Respiratory motion blurred images of the lung bases and the upper abdomen. Lower chest: Visualized lung bases clear apart from the expected dependent atelectasis posteriorly. Heart moderately enlarged. Severe aortic valvular calcification. RIGHT coronary artery atherosclerosis. Hepatobiliary: Surgically absent gallbladder which accounts for the intra and extrahepatic biliary ductal dilation, unchanged since the 2017 CT. Liver normal in size without significant focal parenchymal abnormality. Pancreas: Mildly atrophic.  No mass or peripancreatic inflammation. Spleen: Normal in size and appearance. Adrenals/Urinary Tract: Normal appearing adrenal glands. Scarring involving the RIGHT kidney, unchanged. Extrarenal pelves bilaterally. Cortical cysts in both kidneys. No solid masses involving either kidney. No urinary tract calculi. Normal appearing mildly distended urinary bladder. Stomach/Bowel: Small hiatal hernia. Stomach otherwise normal in appearance. Normal-appearing small bowel. Very large stool burden throughout the colon. Extensive sigmoid colon diverticulosis without evidence of acute diverticulitis. Appendix not conspicuous but no evidence of pericecal inflammation. Vascular/Lymphatic: Severe aortoiliofemoral and visceral artery atherosclerosis without evidence of aneurysm. Normal-appearing portal venous and systemic venous systems. No pathologic lymphadenopathy. Reproductive: Uterus not present and presumed  surgically absent. No adnexal masses. Other: Large paraumbilical hernia just to the RIGHT of midline containing numerous loops of small bowel without evidence of strangulation or incarceration. Musculoskeletal: Thoracolumbar levoscoliosis and  compensatory lower lumbar dextroscoliosis. Osseous demineralization. Degenerative disc disease and spondylosis throughout the lower thoracic spine and lumbar spine. Grade 1 spondylolisthesis of L4 on L5 and grade 2 spondylolisthesis of L5 on S1 as noted previously. No acute findings. IMPRESSION: 1. No acute abnormalities involving the abdomen or pelvis. 2. Very large stool burden throughout the colon. 3. Extensive sigmoid colon diverticulosis without evidence of acute diverticulitis. 4. Large paraumbilical hernia just to the RIGHT of midline containing numerous loops of small bowel without evidence of strangulation or incarceration. 5. Small hiatal hernia. Aortic Atherosclerosis (ICD10-I70.0). Electronically Signed   By: Evangeline Dakin M.D.   On: 09/18/2020 21:13   CT imaging reviewed, no acute abnormalities.  Incidental findings as noted above.  ----------------------------------------- 10:07 PM on 09/18/2020 -----------------------------------------  Patient does appear to be fluid responsive, will admit for further management including speech-language evaluation for concerns of aspiration.  With no no appetite and significant decreased oral intake associated with her hypotension feel the patient will need a more extended medical work-up and needs inpatient evaluation for further care management and treatment planning.  Admission to hospitalist discussed and accepted by Dr. Gay Filler, MD 09/18/20 2243

## 2020-09-18 NOTE — ED Notes (Signed)
Pt BP remains low, Md notified and orders to follow.

## 2020-09-18 NOTE — H&P (Signed)
[place]   PATIENT NAME: Cindy Fitzpatrick    MR#:  [id number]  DATE OF BIRTH:  February 13, 1936  DATE OF ADMISSION:  09/14/2020  PRIMARY CARE PHYSICIAN: Kirk Ruths, MD   REQUESTING/REFERRING PHYSICIAN: Delman Kitten, MD  CHIEF COMPLAINT:   Chief Complaint  Patient presents with  . Psychiatric Evaluation    SI attempt    HISTORY OF PRESENT ILLNESS:  Cindy Fitzpatrick  is a 84 y.o. female with a known history of depression who has been recently suicidal, coronary artery disease, dyslipidemia, osteoarthritis and diverticulitis, who has been awaiting psychiatry admission in the ER after being involuntarily committed.  She was noted to have mild right quadrant abdominal pain over the last couple days as well as significant diminished appetite.  She has been coughing clearing her throat when she eats.  She was noted to have hypotension with a blood pressure of 83/58 today she has been intermittent.  She was given fluid bolus.  She denies any nausea or vomiting diarrhea or melena or bright red bleeding per rectum.  No fever or chills.  No chest pain or palpitations.  Upon presentation to the emergency room, blood pressure was 137/108 with otherwise normal vital signs.  Labs revealed m close to baseline ild hyponatremia and a blood glucose of 202 lactic acid 2.1 and CBC showed anemia.  UA showed 50 glucose with otherwise unremarkable findings.  2 view hand x-ray showed no acute abnormalities and degenerative changes in the first metacarpophalangeal joint with chondrocalcinosis.  Abdominal pelvic CT scan revealed a very large stool burden throughout the colon, extensive sigmoid diverticulosis with no evidence for diverticulitis.  It showed large paraumbilical hernia to the right of the midline with numerous bowel loops without evidence of strangulation or incarceration.  It also showed small hiatal hernia.  Chest x-ray showed no acute cardiopulmonary disease.  The patient was given 1 L bolus of  IV lactated Ringer and later 500 mL IV normal saline bolus.  She will be admitted to a medical monitored bed for further evaluation and management. PAST MEDICAL HISTORY:   Past Medical History:  Diagnosis Date  . Arthritis   . Blood in stool   . Cancer ([place])    skin  . Chicken pox    SHINGLES TWICE  . Colon polyps   . Depression   . Diverticulitis   . GERD (gastroesophageal reflux disease)   . Hay fever   . Heart disease   . Heart murmur   . Hyperlipidemia   . UTI (urinary tract infection)   . Varicose veins of both lower extremities     PAST SURGICAL HISTORY:   Past Surgical History:  Procedure Laterality Date  . HERNIA REPAIR    . KNEE SURGERY    . ORIF ANKLE FRACTURE Right 05/22/2018   Procedure: OPEN REDUCTION INTERNAL FIXATION (ORIF) TRIMALLEOLAR  ANKLE FRACTURE;  Surgeon: Leim Fabry, MD;  Location: [facility] ORS;  Service: Orthopedics;  Laterality: Right;    SOCIAL HISTORY:   Social History   Tobacco Use  . Smoking status: Never Smoker  . Smokeless tobacco: Never Used  Substance Use Topics  . Alcohol use: No    Alcohol/week: 0.0 standard drinks    FAMILY HISTORY:   Family History  Problem Relation Age of Onset  . Stroke Mother   . Heart attack Father     DRUG ALLERGIES:   Allergies  Allergen Reactions  . Codeine Other (See Comments)    Other reaction(s):  Headache  . Hydrocodone-Acetaminophen Nausea And Vomiting  . Meperidine     Other reaction(s): Dizziness  . Niacin Er Other (See Comments)    Patient doesn't remember  . Other Nausea Only  . Propoxyphene Other (See Comments)    Other reaction(s): Dizziness  . Solifenacin Other (See Comments)    Other reaction(s): Abdominal Pain  . Statins     Other reaction(s): Muscle Pain Weakness  . Sulfa Antibiotics Other (See Comments)    Patient doesn' t remember  . Fish-Derived Products Rash    Weakness  . Nabumetone Rash  . Oxycodone-Acetaminophen Rash    Couldn't think or remember anything     REVIEW OF SYSTEMS:   ROS As per history of present illness. All pertinent systems were reviewed above. Constitutional, HEENT, cardiovascular, respiratory, GI, GU, musculoskeletal, neuro, psychiatric, endocrine, integumentary and hematologic systems were reviewed and are otherwise negative/unremarkable except for positive findings mentioned above in the HPI.   MEDICATIONS AT HOME:   Prior to Admission medications   Medication Sig Start Date End Date Taking? Authorizing Provider  acetaminophen (TYLENOL) 325 MG tablet Take 650 mg by mouth every 8 (eight) hours as needed for mild pain.   Yes [provider]  alum & mag hydroxide-simeth (MAALOX PLUS) 400-400-40 MG/5ML suspension Take 30 mLs by mouth every 4 (four) hours as needed for indigestion.    Yes [provider]  bisacodyl (DULCOLAX) 10 MG suppository Place 10 mg rectally daily as needed for moderate constipation. 05/24/18  Yes [provider]  buPROPion (WELLBUTRIN SR) 150 MG 12 hr tablet Take 150 mg by mouth daily.   Yes [provider]  carboxymethylcellulose (REFRESH PLUS) 0.5 % SOLN Place 2 drops into the right eye 2 (two) times daily as needed (redness).   Yes [provider]  cloNIDine (CATAPRES) 0.1 MG tablet TAKE 1 TABLET BY MOUTH TWICE DAILY Patient taking differently: Take 0.1 mg by mouth 2 (two) times daily.  10/16/19  Yes [name], MD  diltiazem (CARDIZEM) 30 MG tablet TAKE ONE TABLET 3 TIMES DAILY Patient taking differently: Take 30 mg by mouth 3 (three) times daily.  10/08/19  Yes [name], MD  losartan-hydrochlorothiazide (HYZAAR) 50-12.5 MG tablet TAKE ONE TABLET EVERY DAY Patient taking differently: Take 1 tablet by mouth daily.  12/17/19  Yes [name], MD  nitroGLYCERIN (NITRODUR - DOSED IN MG/24 HR) 0.4 mg/hr patch APPLY 1 PATCH DAILY.LEAVE ON FOR 12-14 HOURS & REMOVE FOR 10-12 HOURS BEFORE APPLYING THE NEXT PATCH Patient taking differently:  Place 0.4 mg onto the skin daily. (remove after 12 hours and apply new patch after additional 12 hours) 10/08/19  Yes [name], MD  polyethylene glycol (MIRALAX / GLYCOLAX) 17 g packet Take 17 g by mouth daily.   Yes [provider]  potassium chloride (KLOR-CON) 10 MEQ tablet TAKE ONE TABLET EVERY DAY Patient taking differently: Take 10 mEq by mouth daily.  12/18/19  Yes [name], MD  QUEtiapine (SEROQUEL) 25 MG tablet Take 25 mg by mouth 2 (two) times daily.   Yes [provider]  sertraline (ZOLOFT) 100 MG tablet TAKE TWO TABLETS AT BEDTIME Patient taking differently: Take 200 mg by mouth daily.  10/18/19  Yes [name], MD      VITAL SIGNS:  Blood pressure (!) 112/55, pulse 88, temperature 98.1 F (36.7 C), temperature source Oral, resp. rate (!) 25, height 5\' 3"  (1.6 m), weight 73.1 kg, SpO2 97 %.  PHYSICAL  EXAMINATION:  Physical Exam  GENERAL:  84 y.o.-year-old Caucasian female patient lying in the bed with no acute distress.  EYES: Pupils equal, round, reactive to light and accommodation. No scleral icterus. Extraocular muscles intact.  HEENT: Head atraumatic, normocephalic. Oropharynx and nasopharynx clear.  NECK:  Supple, no jugular venous distention. No thyroid enlargement, no tenderness.  LUNGS: Normal breath sounds bilaterally, no wheezing, rales,rhonchi or crepitation. No use of accessory muscles of respiration.  CARDIOVASCULAR: Regular rate and rhythm, S1, S2 normal. No murmurs, rubs, or gallops.  ABDOMEN: Soft, nondistended, with moderate lower quadrant and suprapubic tenderness without rebound tenderness guarding or rigidity. Bowel sounds present. No organomegaly or mass.  EXTREMITIES: No pedal edema, cyanosis, or clubbing.  NEUROLOGIC: Cranial nerves II through XII are intact. Muscle strength 5/5 in all extremities. Sensation intact. Gait not checked.  PSYCHIATRIC: The patient is alert and oriented x 3.  Normal affect and good  eye contact. SKIN: No obvious rash, lesion, or ulcer.   LABORATORY PANEL:   CBC Recent Labs  Lab 09/18/20 2010  WBC 10.6*  HGB 9.2*  HCT 29.9*  PLT 192   ------------------------------------------------------------------------------------------------------------------  Chemistries  Recent Labs  Lab 09/18/20 0042 09/18/20 0042 09/18/20 2010  NA 138   < > 134*  K 4.0   < > 3.7  CL 105   < > 102  CO2 24   < > 24  GLUCOSE 223*   < > 202*  BUN 29*   < > 23  CREATININE 0.92   < > 0.80  CALCIUM 8.6*   < > 8.4*  AST 19  --   --   ALT 10  --   --   ALKPHOS 59  --   --   BILITOT 0.6  --   --    < > = values in this interval not displayed.   ------------------------------------------------------------------------------------------------------------------  Cardiac Enzymes No results for input(s): TROPONINI in the last 168 hours. ------------------------------------------------------------------------------------------------------------------  RADIOLOGY:  CT ABDOMEN PELVIS W CONTRAST  Result Date: 09/18/2020 CLINICAL DATA:  84 year old with acute generalized abdominal pain. EXAM: CT ABDOMEN AND PELVIS WITH CONTRAST TECHNIQUE: Multidetector CT imaging of the abdomen and pelvis was performed using the standard protocol following bolus administration of intravenous contrast. CONTRAST:  152mL OMNIPAQUE IOHEXOL 300 MG/ML IV. COMPARISON:  04/22/2016. FINDINGS: Respiratory motion blurred images of the lung bases and the upper abdomen. Lower chest: Visualized lung bases clear apart from the expected dependent atelectasis posteriorly. Heart moderately enlarged. Severe aortic valvular calcification. RIGHT coronary artery atherosclerosis. Hepatobiliary: Surgically absent gallbladder which accounts for the intra and extrahepatic biliary ductal dilation, unchanged since the 2017 CT. Liver normal in size without significant focal parenchymal abnormality. Pancreas: Mildly atrophic.  No mass or  peripancreatic inflammation. Spleen: Normal in size and appearance. Adrenals/Urinary Tract: Normal appearing adrenal glands. Scarring involving the RIGHT kidney, unchanged. Extrarenal pelves bilaterally. Cortical cysts in both kidneys. No solid masses involving either kidney. No urinary tract calculi. Normal appearing mildly distended urinary bladder. Stomach/Bowel: Small hiatal hernia. Stomach otherwise normal in appearance. Normal-appearing small bowel. Very large stool burden throughout the colon. Extensive sigmoid colon diverticulosis without evidence of acute diverticulitis. Appendix not conspicuous but no evidence of pericecal inflammation. Vascular/Lymphatic: Severe aortoiliofemoral and visceral artery atherosclerosis without evidence of aneurysm. Normal-appearing portal venous and systemic venous systems. No pathologic lymphadenopathy. Reproductive: Uterus not present and presumed surgically absent. No adnexal masses. Other: Large paraumbilical hernia just to the RIGHT of midline containing numerous loops of small bowel without evidence  of strangulation or incarceration. Musculoskeletal: Thoracolumbar levoscoliosis and compensatory lower lumbar dextroscoliosis. Osseous demineralization. Degenerative disc disease and spondylosis throughout the lower thoracic spine and lumbar spine. Grade 1 spondylolisthesis of L4 on L5 and grade 2 spondylolisthesis of L5 on S1 as noted previously. No acute findings. IMPRESSION: 1. No acute abnormalities involving the abdomen or pelvis. 2. Very large stool burden throughout the colon. 3. Extensive sigmoid colon diverticulosis without evidence of acute diverticulitis. 4. Large paraumbilical hernia just to the RIGHT of midline containing numerous loops of small bowel without evidence of strangulation or incarceration. 5. Small hiatal hernia. Aortic Atherosclerosis (ICD10-I70.0). Electronically Signed   By: Evangeline Dakin M.D.   On: 09/18/2020 21:13   DG Hand 2 View  Right  Result Date: 09/18/2020 CLINICAL DATA:  Swelling over the thenar eminence. EXAM: RIGHT HAND - 2 VIEW COMPARISON:  None. FINDINGS: Two-view exam is limited by superimposition of the fingers on the lateral projection. Within this limitation, no evidence for an acute fracture. No subluxation or dislocation. Bones are diffusely demineralized. Degenerative changes are noted at the first carpometacarpal joint. Mineralization of the TFCC evident. IMPRESSION: 1. No acute bony abnormality. 2. Degenerative changes at the first carpometacarpal joint. 3. Chondrocalcinosis of the TFCC. Electronically Signed   By: Misty Stanley M.D.   On: 09/18/2020 14:34   DG Chest Portable 1 View  Result Date: 09/18/2020 CLINICAL DATA:  Cough elevated lactic acid difficulty swallowing EXAM: PORTABLE CHEST 1 VIEW COMPARISON:  04/22/2016 FINDINGS: Chronic elevation right diaphragm. No consolidation or effusion. Stable cardiomediastinal silhouette with aortic atherosclerosis. No pneumothorax. IMPRESSION: No active disease. Electronically Signed   By: Donavan Foil M.D.   On: 09/18/2020 22:28      IMPRESSION AND PLAN:   1.  Abdominal pain in the setting of large paraumbilical hernia. -The patient will be admitted to a medical monitored bed. -She will be placed on hydration with IV normal saline and will keep her n.p.o. -A general surgery consultation will be obtained. -We will be notified about the patient in a.m. -Pain management will be provided.  2.  Hypotension, likely secondary to volume depletion with anorexia. -The patient will be hydrated with IV normal saline. -Blood pressure will be closely monitored. -Antihypertensives will be held off for hypotension.  3.  Dysphagia with suspected aspiration without evidence of aspiration pneumonia. -Speech therapy consult to be obtained.  4.  Depression with suicidal behavior. -Psychiatry consultation will be obtained for follow-up. -We will continue Seroquel and  Zoloft  5.  Hypertension. -We will continue her antihypertensives with holding parameters and with improvement of blood pressure.  6.  DVT prophylaxis. -Subcutaneous Lovenox    All the records are reviewed and case discussed with ED provider. The plan of care was discussed in details with the patient (and family). I answered all questions. The patient agreed to proceed with the above mentioned plan. Further management will depend upon hospital course.   CODE STATUS: Full code  Status is: Inpatient  Remains inpatient appropriate because:Ongoing diagnostic testing needed not appropriate for outpatient work up, Unsafe d/c plan, IV treatments appropriate due to intensity of illness or inability to take PO and Inpatient level of care appropriate due to severity of illness   Dispo: The patient is from: Home              Anticipated d/c is to: Psych admission              Anticipated d/c date is: 2 days  Patient currently is not medically stable to d/c.   TOTAL TIME TAKING CARE OF THIS PATIENT: 55 minutes.    Christel Mormon M.D on 09/18/2020 at 10:46 PM  [facility]   From 7 PM-7 AM, contact night-coverage [email or web address]  CC: Primary care physician; Kirk Ruths, MD

## 2020-09-18 NOTE — ED Notes (Signed)
Pt given meal tray.

## 2020-09-18 NOTE — ED Notes (Signed)
Pt states hx of esoophageal strictures and has difficulty swallowing, coughing w/ thin liquids and refusal to eat diet provided.

## 2020-09-18 NOTE — BH Assessment (Addendum)
TTS spoke to Donnie Coffin [facility] [phone]) who reports pt to no longer be accepted due to pt's orders expiring today at 12:20pm . Diane request for referral to be re-faxed to be reviewed again with the MD.   TTS re-faxed Pt's referral to [facility]  and other appropriate facilities with updated information at 12:50pm   Referral information for Psychiatric Hospitalization faxed to:  Rosana Hoes ([phone]---[phone]---[phone]) Cletus Gash reports pt to be denied due to Dr. Franchot Mimes refusing to redo orders per pt's acuity. Cletus Gash reports pt to be more appropriate for long term care and to be unsure if or when their facility will be able to manage pt's acuity needs. TTS also, confirmed new referral received today.    [facility] [phone], [phone], [phone] or [phone])  [facility] [phone] -or- [phone], [phone])  [facility] [phone] or [phone]),   .[facility] [phone] -or- [phone]),   .[facility] [phone] or [phone])  .[facility] [phone])

## 2020-09-19 DIAGNOSIS — R1031 Right lower quadrant pain: Secondary | ICD-10-CM | POA: Diagnosis not present

## 2020-09-19 DIAGNOSIS — F3164 Bipolar disorder, current episode mixed, severe, with psychotic features: Secondary | ICD-10-CM | POA: Diagnosis not present

## 2020-09-19 LAB — CBC
HCT: 28.2 % — ABNORMAL LOW (ref 36.0–46.0)
Hemoglobin: 8.5 g/dL — ABNORMAL LOW (ref 12.0–15.0)
MCH: 24 pg — ABNORMAL LOW (ref 26.0–34.0)
MCHC: 30.1 g/dL (ref 30.0–36.0)
MCV: 79.7 fL — ABNORMAL LOW (ref 80.0–100.0)
Platelets: 171 10*3/uL (ref 150–400)
RBC: 3.54 MIL/uL — ABNORMAL LOW (ref 3.87–5.11)
RDW: 15.6 % — ABNORMAL HIGH (ref 11.5–15.5)
WBC: 10.6 10*3/uL — ABNORMAL HIGH (ref 4.0–10.5)
nRBC: 0 % (ref 0.0–0.2)

## 2020-09-19 LAB — BASIC METABOLIC PANEL
Anion gap: 11 (ref 5–15)
BUN: 20 mg/dL (ref 8–23)
CO2: 22 mmol/L (ref 22–32)
Calcium: 8.3 mg/dL — ABNORMAL LOW (ref 8.9–10.3)
Chloride: 104 mmol/L (ref 98–111)
Creatinine, Ser: 0.69 mg/dL (ref 0.44–1.00)
GFR, Estimated: >60 mL/min (ref >60)
Glucose, Bld: 130 mg/dL — ABNORMAL HIGH (ref 70–99)
Potassium: 3.6 mmol/L (ref 3.5–5.1)
Sodium: 137 mmol/L (ref 135–145)

## 2020-09-19 LAB — RESP PANEL BY RT-PCR (FLU A&B, COVID) ARPGX2
Influenza A by PCR: NEGATIVE
Influenza B by PCR: NEGATIVE
SARS Coronavirus 2 by RT PCR: NEGATIVE

## 2020-09-19 LAB — LACTIC ACID, PLASMA: Lactic Acid, Venous: 0.8 mmol/L (ref 0.5–1.9)

## 2020-09-19 MED ORDER — ENOXAPARIN SODIUM 40 MG/0.4ML ~~LOC~~ SOLN
40.0000 mg | SUBCUTANEOUS | Status: DC
  Administered 2020-09-19 – 2020-09-22 (×4): 40 mg via SUBCUTANEOUS
  Filled 2020-09-19 (×5): qty 0.4

## 2020-09-19 NOTE — ED Notes (Signed)
This RN attempted to walk patient with walker. Pt was unable to ambulate and states she does not ambulate at her baseline. Pt says she uses walker at home to turn and pivot at home and that is it. Pt brief checked. Pt remains dry at this time. Purewick still in place.

## 2020-09-19 NOTE — ED Notes (Signed)
Advised nurse that patient has assigned bed 

## 2020-09-19 NOTE — Consult Note (Addendum)
Patient seen and evaluated in person by this provider and TTS.  She is improving with no psychosis noted while in the ED. Called her son and daughter-in-law in the presence of TTS, [name] and Albie Bazin. Family's concern is that she will spiral back to where she was if she returns to [facility] who wants her to stabilize in a geriatric psych.  Will continue to search for geriatric facility--declined at multiple facilities.  Female transportation is an issue via police.  [facility] declined via transportation issues. Patient states "I don't like it there is the big thing."  Family feels this is because of her delusion that a man is after her there and is threatening her.  Family understands she cannot return to [facility] until stabilization in a geriatric hospitalization with medication adjustments.  Medications adjusted while in the ED with no current signs or symptoms noted.  Stress is low at this time for the patient which could be a factor in her lack of symptoms.  Waylan Boga, PMHNP

## 2020-09-19 NOTE — ED Notes (Addendum)
Pt is NPO until after speech consult   Night shift did not give lovenox at 2300 last pm - pharmacy requested to retime medication

## 2020-09-19 NOTE — ED Notes (Signed)
Messaged admitting provider to see if he was going to discharge pt from medical services r/t conversation this am while he rounded

## 2020-09-19 NOTE — ED Notes (Signed)
Attempted to call report and floor refused to take it

## 2020-09-19 NOTE — BH Assessment (Signed)
TTS and Waylan Boga, NP spoke with patient's son, Vantasia Pinkney and daughter in law, Kailah Pennel. NP updated family members on patient's current disposition. If patient continues to improve and no longer meets INPT criteria, patient will be discharged from psych services. TTS and NP addressed family's concerns and they voiced their understanding of the plan.

## 2020-09-19 NOTE — BH Assessment (Signed)
Referral checks:   Mayer Camel [phone]: Left message on voicemail to return call  [place]: [phone]: Spoke with Denice Paradise and she states they are expecting discharges within the next couple of days and to fax referral for review.

## 2020-09-19 NOTE — Discharge Summary (Signed)
Physician Discharge Summary  RONEE RANGANATHAN LDJ:[id number] DOB: September 02, 1936 DOA: 09/14/2020  PCP: Kirk Ruths, MD  Admit date: 09/14/2020 Discharge date: 09/19/2020  Admitted From: Home Disposition: Being discharged from medical service but she will be held in the ED until placement is arranged in [place] psych unit far.  Recommendations for Outpatient Follow-up:  1. Follow up with PCP in 1-2 weeks 2. Please obtain BMP/CBC in one week 3. Please follow up with your PCP on the following pending results: Unresulted Labs (From admission, onward)          Start     Ordered   09/19/20 0742  Lactic acid, plasma  STAT Now then every 3 hours,   STAT      09/19/20 0741          Subjective: Seen and examined earlier today.  Patient had no complaint.  Denied any abdominal pain.  Cindy Fitzpatrick  is a 84 y.o. female with a known history of depression who has been recently suicidal, coronary artery disease, dyslipidemia, osteoarthritis and diverticulitis, who has been awaiting psychiatry admission in the ER after being involuntarily committed.  She was noted to have mild right quadrant abdominal pain over the last couple days as well as significant diminished appetite.  She has been coughing clearing her throat when she eats.  She was noted to have hypotension with a blood pressure of 83/58 today she has been intermittent.  She was given fluid bolus.  She denies any nausea or vomiting diarrhea or melena or bright red bleeding per rectum.  No fever or chills.  No chest pain or palpitations.  Upon presentation to the emergency room, blood pressure was 137/108 with otherwise normal vital signs.  Labs revealed m close to baseline ild hyponatremia and a blood glucose of 202 lactic acid 2.1 and CBC showed anemia.  UA showed 50 glucose with otherwise unremarkable findings.  2 view hand x-ray showed no acute abnormalities and degenerative changes in the first metacarpophalangeal joint with chondrocalcinosis.   Abdominal pelvic CT scan revealed a very large stool burden throughout the colon, extensive sigmoid diverticulosis with no evidence for diverticulitis.  It showed large paraumbilical hernia to the right of the midline with numerous bowel loops without evidence of strangulation or incarceration.  It also showed small hiatal hernia.  Chest x-ray showed no acute cardiopulmonary disease.  The patient was given 1 L bolus of IV lactated Ringer and later 500 mL IV normal saline bolus.  She will be admitted to a medical monitored bed for further evaluation and management.  Brief/Interim Summary: Patient was held under involuntary commitment in the ED for last several days while she was waiting for [facility] psych unit placement.  Patient then had some abdominal pain and hypotension for which hospitalist were consulted and were asked to admit.  Patient admitted under hospital service for abdominal pain.  Abdominal imaging studies were done which showed large hiatal hernia which was not reticulated but contained bowel segments.  General surgery was consulted who did not recommend any intervention as this is chronic and currently not causing any problem and is reducible.  Patient denied any abdominal pain.  She is nontender on examination.  Blood pressure is fine.  Patient does not meet any criteria to stay as inpatient under medicine service.  I had a lengthy discussion back-and-forth with several people, patient's primary nurse who had discussed with charge nurse in the ED and I have been informed that I can discharge this patient  from medicine service however patient will be held in the ED like she was before until she gets [facility] psych unit.  Any questions regarding psychiatry medications will be delayed to psychiatry department.  Discharge Diagnoses:  Principal Problem:   Bipolar affective disorder, mixed, severe, with psychotic behavior [facility]) Active Problems:   Abdominal pain    Discharge  Instructions  Discharge Instructions    Discharge patient   Complete by: As directed    Discharge disposition: 01-Home or Self Care   Discharge patient date: 09/19/2020     Allergies as of 09/19/2020      Reactions   Codeine Other (See Comments)   Other reaction(s): Headache   Hydrocodone-acetaminophen Nausea And Vomiting   Meperidine    Other reaction(s): Dizziness   Niacin Er Other (See Comments)   Patient doesn't remember   Other Nausea Only   Propoxyphene Other (See Comments)   Other reaction(s): Dizziness   Solifenacin Other (See Comments)   Other reaction(s): Abdominal Pain   Statins    Other reaction(s): Muscle Pain Weakness   Sulfa Antibiotics Other (See Comments)   Patient doesn' t remember   Fish-derived Products Rash   Weakness   Nabumetone Rash   Oxycodone-acetaminophen Rash   Couldn't think or remember anything      Medication List    TAKE these medications   acetaminophen 325 MG tablet Commonly known as: TYLENOL Take 650 mg by mouth every 8 (eight) hours as needed for mild pain.   alum & mag hydroxide-simeth 045-997-74 MG/5ML suspension Commonly known as: MAALOX PLUS Take 30 mLs by mouth every 4 (four) hours as needed for indigestion.   bisacodyl 10 MG suppository Commonly known as: DULCOLAX Place 10 mg rectally daily as needed for moderate constipation.   buPROPion 150 MG 12 hr tablet Commonly known as: WELLBUTRIN SR Take 150 mg by mouth daily.   carboxymethylcellulose 0.5 % Soln Commonly known as: REFRESH PLUS Place 2 drops into the right eye 2 (two) times daily as needed (redness).   cloNIDine 0.1 MG tablet Commonly known as: CATAPRES TAKE 1 TABLET BY MOUTH TWICE DAILY   diltiazem 30 MG tablet Commonly known as: CARDIZEM TAKE ONE TABLET 3 TIMES DAILY   losartan-hydrochlorothiazide 50-12.5 MG tablet Commonly known as: HYZAAR TAKE ONE TABLET EVERY DAY   nitroGLYCERIN 0.4 mg/hr patch Commonly known as: NITRODUR - Dosed in mg/24  hr APPLY 1 PATCH DAILY.LEAVE ON FOR 12-14 HOURS & REMOVE FOR 10-12 HOURS BEFORE APPLYING THE NEXT PATCH What changed: See the new instructions.   polyethylene glycol 17 g packet Commonly known as: MIRALAX / GLYCOLAX Take 17 g by mouth daily.   potassium chloride 10 MEQ tablet Commonly known as: KLOR-CON TAKE ONE TABLET EVERY DAY   QUEtiapine 25 MG tablet Commonly known as: SEROQUEL Take 25 mg by mouth 2 (two) times daily.   sertraline 100 MG tablet Commonly known as: ZOLOFT TAKE TWO TABLETS AT BEDTIME What changed: when to take this       Allergies  Allergen Reactions  . Codeine Other (See Comments)    Other reaction(s): Headache  . Hydrocodone-Acetaminophen Nausea And Vomiting  . Meperidine     Other reaction(s): Dizziness  . Niacin Er Other (See Comments)    Patient doesn't remember  . Other Nausea Only  . Propoxyphene Other (See Comments)    Other reaction(s): Dizziness  . Solifenacin Other (See Comments)    Other reaction(s): Abdominal Pain  . Statins     Other reaction(s): Muscle  Pain Weakness  . Sulfa Antibiotics Other (See Comments)    Patient doesn' t remember  . Fish-Derived Products Rash    Weakness  . Nabumetone Rash  . Oxycodone-Acetaminophen Rash    Couldn't think or remember anything    Consultations: Psychiatry   Procedures/Studies: CT ABDOMEN PELVIS W CONTRAST  Result Date: 09/18/2020 CLINICAL DATA:  85 year old with acute generalized abdominal pain. EXAM: CT ABDOMEN AND PELVIS WITH CONTRAST TECHNIQUE: Multidetector CT imaging of the abdomen and pelvis was performed using the standard protocol following bolus administration of intravenous contrast. CONTRAST:  131mL OMNIPAQUE IOHEXOL 300 MG/ML IV. COMPARISON:  04/22/2016. FINDINGS: Respiratory motion blurred images of the lung bases and the upper abdomen. Lower chest: Visualized lung bases clear apart from the expected dependent atelectasis posteriorly. Heart moderately enlarged. Severe aortic  valvular calcification. RIGHT coronary artery atherosclerosis. Hepatobiliary: Surgically absent gallbladder which accounts for the intra and extrahepatic biliary ductal dilation, unchanged since the 2017 CT. Liver normal in size without significant focal parenchymal abnormality. Pancreas: Mildly atrophic.  No mass or peripancreatic inflammation. Spleen: Normal in size and appearance. Adrenals/Urinary Tract: Normal appearing adrenal glands. Scarring involving the RIGHT kidney, unchanged. Extrarenal pelves bilaterally. Cortical cysts in both kidneys. No solid masses involving either kidney. No urinary tract calculi. Normal appearing mildly distended urinary bladder. Stomach/Bowel: Small hiatal hernia. Stomach otherwise normal in appearance. Normal-appearing small bowel. Very large stool burden throughout the colon. Extensive sigmoid colon diverticulosis without evidence of acute diverticulitis. Appendix not conspicuous but no evidence of pericecal inflammation. Vascular/Lymphatic: Severe aortoiliofemoral and visceral artery atherosclerosis without evidence of aneurysm. Normal-appearing portal venous and systemic venous systems. No pathologic lymphadenopathy. Reproductive: Uterus not present and presumed surgically absent. No adnexal masses. Other: Large paraumbilical hernia just to the RIGHT of midline containing numerous loops of small bowel without evidence of strangulation or incarceration. Musculoskeletal: Thoracolumbar levoscoliosis and compensatory lower lumbar dextroscoliosis. Osseous demineralization. Degenerative disc disease and spondylosis throughout the lower thoracic spine and lumbar spine. Grade 1 spondylolisthesis of L4 on L5 and grade 2 spondylolisthesis of L5 on S1 as noted previously. No acute findings. IMPRESSION: 1. No acute abnormalities involving the abdomen or pelvis. 2. Very large stool burden throughout the colon. 3. Extensive sigmoid colon diverticulosis without evidence of acute  diverticulitis. 4. Large paraumbilical hernia just to the RIGHT of midline containing numerous loops of small bowel without evidence of strangulation or incarceration. 5. Small hiatal hernia. Aortic Atherosclerosis (ICD10-I70.0). Electronically Signed   By: Evangeline Dakin M.D.   On: 09/18/2020 21:13   DG Hand 2 View Right  Result Date: 09/18/2020 CLINICAL DATA:  Swelling over the thenar eminence. EXAM: RIGHT HAND - 2 VIEW COMPARISON:  None. FINDINGS: Two-view exam is limited by superimposition of the fingers on the lateral projection. Within this limitation, no evidence for an acute fracture. No subluxation or dislocation. Bones are diffusely demineralized. Degenerative changes are noted at the first carpometacarpal joint. Mineralization of the TFCC evident. IMPRESSION: 1. No acute bony abnormality. 2. Degenerative changes at the first carpometacarpal joint. 3. Chondrocalcinosis of the TFCC. Electronically Signed   By: Misty Stanley M.D.   On: 09/18/2020 14:34   DG Chest Portable 1 View  Result Date: 09/18/2020 CLINICAL DATA:  Cough elevated lactic acid difficulty swallowing EXAM: PORTABLE CHEST 1 VIEW COMPARISON:  04/22/2016 FINDINGS: Chronic elevation right diaphragm. No consolidation or effusion. Stable cardiomediastinal silhouette with aortic atherosclerosis. No pneumothorax. IMPRESSION: No active disease. Electronically Signed   By: Donavan Foil M.D.   On: 09/18/2020 22:28  Discharge Exam: Vitals:   09/19/20 0830 09/19/20 1300  BP: (!) 141/56 (!) 127/48  Pulse: 86 86  Resp: 16 16  Temp:    SpO2: 98% 98%   Vitals:   09/19/20 0218 09/19/20 0650 09/19/20 0830 09/19/20 1300  BP: (!) 93/49 (!) 141/74 (!) 141/56 (!) 127/48  Pulse: 88 81 86 86  Resp: (!) 29 18 16 16   Temp:  98.7 F (37.1 C)    TempSrc:  Oral    SpO2: 98% 100% 98% 98%  Weight:      Height:        General: Pt is alert, awake, not in acute distress Cardiovascular: RRR, S1/S2 +, no rubs, no  gallops Respiratory: CTA bilaterally, no wheezing, no rhonchi Abdominal: Soft, NT, ND, bowel sounds + Extremities: no edema, no cyanosis    The results of significant diagnostics from this hospitalization (including imaging, microbiology, ancillary and laboratory) are listed below for reference.     Microbiology: Recent Results (from the past 240 hour(s))  Resp Panel by RT-PCR (Flu A&B, Covid) Nasopharyngeal Swab     Status: None   Collection Time: 09/16/20  7:16 PM   Specimen: Nasopharyngeal Swab; Nasopharyngeal(NP) swabs in vial transport medium  Result Value Ref Range Status   SARS Coronavirus 2 by RT PCR NEGATIVE NEGATIVE Final    Comment: (NOTE) SARS-CoV-2 target nucleic acids are NOT DETECTED.  The SARS-CoV-2 RNA is generally detectable in upper respiratory specimens during the acute phase of infection. The lowest concentration of SARS-CoV-2 viral copies this assay can detect is 138 copies/mL. A negative result does not preclude SARS-Cov-2 infection and should not be used as the sole basis for treatment or other patient management decisions. A negative result may occur with  improper specimen collection/handling, submission of specimen other than nasopharyngeal swab, presence of viral mutation(s) within the areas targeted by this assay, and inadequate number of viral copies(<138 copies/mL). A negative result must be combined with clinical observations, patient history, and epidemiological information. The expected result is Negative.  Fact Sheet for Patients:  EntrepreneurPulse.com.au  Fact Sheet for Healthcare Providers:  IncredibleEmployment.be  This test is no t yet approved or cleared by the [place] FDA and  has been authorized for detection and/or diagnosis of SARS-CoV-2 by FDA under an Emergency Use Authorization (EUA). This EUA will remain  in effect (meaning this test can be used) for the duration of the COVID-19  declaration under Section 564(b)(1) of the Act, 21 U.S.C.section 360bbb-3(b)(1), unless the authorization is terminated  or revoked sooner.       Influenza A by PCR NEGATIVE NEGATIVE Final   Influenza B by PCR NEGATIVE NEGATIVE Final    Comment: (NOTE) The Xpert Xpress SARS-CoV-2/FLU/RSV plus assay is intended as an aid in the diagnosis of influenza from Nasopharyngeal swab specimens and should not be used as a sole basis for treatment. Nasal washings and aspirates are unacceptable for Xpert Xpress SARS-CoV-2/FLU/RSV testing.  Fact Sheet for Patients: EntrepreneurPulse.com.au  Fact Sheet for Healthcare Providers: IncredibleEmployment.be  This test is not yet approved or cleared by the [place] FDA and has been authorized for detection and/or diagnosis of SARS-CoV-2 by FDA under an Emergency Use Authorization (EUA). This EUA will remain in effect (meaning this test can be used) for the duration of the COVID-19 declaration under Section 564(b)(1) of the Act, 21 U.S.C. section 360bbb-3(b)(1), unless the authorization is terminated or revoked.  Performed at [facility], [address], [place], [place] [address]  Resp Panel by RT-PCR (Flu A&B, Covid) Nasopharyngeal Swab     Status: None   Collection Time: 09/18/20 10:14 PM   Specimen: Nasopharyngeal Swab; Nasopharyngeal(NP) swabs in vial transport medium  Result Value Ref Range Status   SARS Coronavirus 2 by RT PCR NEGATIVE NEGATIVE Final    Comment: (NOTE) SARS-CoV-2 target nucleic acids are NOT DETECTED.  The SARS-CoV-2 RNA is generally detectable in upper respiratory specimens during the acute phase of infection. The lowest concentration of SARS-CoV-2 viral copies this assay can detect is 138 copies/mL. A negative result does not preclude SARS-Cov-2 infection and should not be used as the sole basis for treatment or other patient management decisions. A negative result  may occur with  improper specimen collection/handling, submission of specimen other than nasopharyngeal swab, presence of viral mutation(s) within the areas targeted by this assay, and inadequate number of viral copies(<138 copies/mL). A negative result must be combined with clinical observations, patient history, and epidemiological information. The expected result is Negative.  Fact Sheet for Patients:  EntrepreneurPulse.com.au  Fact Sheet for Healthcare Providers:  IncredibleEmployment.be  This test is no t yet approved or cleared by the [place] FDA and  has been authorized for detection and/or diagnosis of SARS-CoV-2 by FDA under an Emergency Use Authorization (EUA). This EUA will remain  in effect (meaning this test can be used) for the duration of the COVID-19 declaration under Section 564(b)(1) of the Act, 21 U.S.C.section 360bbb-3(b)(1), unless the authorization is terminated  or revoked sooner.       Influenza A by PCR NEGATIVE NEGATIVE Final   Influenza B by PCR NEGATIVE NEGATIVE Final    Comment: (NOTE) The Xpert Xpress SARS-CoV-2/FLU/RSV plus assay is intended as an aid in the diagnosis of influenza from Nasopharyngeal swab specimens and should not be used as a sole basis for treatment. Nasal washings and aspirates are unacceptable for Xpert Xpress SARS-CoV-2/FLU/RSV testing.  Fact Sheet for Patients: EntrepreneurPulse.com.au  Fact Sheet for Healthcare Providers: IncredibleEmployment.be  This test is not yet approved or cleared by the [place] FDA and has been authorized for detection and/or diagnosis of SARS-CoV-2 by FDA under an Emergency Use Authorization (EUA). This EUA will remain in effect (meaning this test can be used) for the duration of the COVID-19 declaration under Section 564(b)(1) of the Act, 21 U.S.C. section 360bbb-3(b)(1), unless the authorization is terminated  or revoked.  Performed at [facility], [address] [place], [place] [address]      Labs: BNP (last 3 results) No results for input(s): BNP in the last 8760 hours. Basic Metabolic Panel: Recent Labs  Lab 09/14/20 1948 09/18/20 0042 09/18/20 2010 09/19/20 0548  NA 139 138 134* 137  K 3.6 4.0 3.7 3.6  CL 104 105 102 104  CO2 22 24 24 22   GLUCOSE 112* 223* 202* 130*  BUN 30* 29* 23 20  CREATININE 0.84 0.92 0.80 0.69  CALCIUM 9.0 8.6* 8.4* 8.3*   Liver Function Tests: Recent Labs  Lab 09/14/20 1948 09/18/20 0042  AST 20 19  ALT 14 10  ALKPHOS 61 59  BILITOT 0.4 0.6  PROT 6.6 6.2*  ALBUMIN 4.0 3.4*   No results for input(s): LIPASE, AMYLASE in the last 168 hours. No results for input(s): AMMONIA in the last 168 hours. CBC: Recent Labs  Lab 09/14/20 1948 09/18/20 0042 09/18/20 2010 09/19/20 0548  WBC 7.2 11.5* 10.6* 10.6*  NEUTROABS  --  9.0*  --   --   HGB 8.9*  10.1* 9.2* 8.5*  HCT 29.8* 33.7* 29.9* 28.2*  MCV 79.3* 80.0 78.7* 79.7*  PLT 191 202 192 171   Cardiac Enzymes: No results for input(s): CKTOTAL, CKMB, CKMBINDEX, TROPONINI in the last 168 hours. BNP: Invalid input(s): POCBNP CBG: No results for input(s): GLUCAP in the last 168 hours. D-Dimer No results for input(s): DDIMER in the last 72 hours. Hgb A1c No results for input(s): HGBA1C in the last 72 hours. Lipid Profile No results for input(s): CHOL, HDL, LDLCALC, TRIG, CHOLHDL, LDLDIRECT in the last 72 hours. Thyroid function studies No results for input(s): TSH, T4TOTAL, T3FREE, THYROIDAB in the last 72 hours.  Invalid input(s): FREET3 Anemia work up No results for input(s): VITAMINB12, FOLATE, FERRITIN, TIBC, IRON, RETICCTPCT in the last 72 hours. Urinalysis    Component Value Date/Time   COLORURINE YELLOW (A) 09/18/2020 2010   APPEARANCEUR CLEAR (A) 09/18/2020 2010   APPEARANCEUR Clear 08/08/2013 1416   LABSPEC 1.021 09/18/2020 2010   LABSPEC 1.004 08/08/2013 1416    PHURINE 5.0 09/18/2020 2010   GLUCOSEU 50 (A) 09/18/2020 2010   GLUCOSEU Negative 08/08/2013 1416   HGBUR NEGATIVE 09/18/2020 2010   BILIRUBINUR NEGATIVE 09/18/2020 2010   BILIRUBINUR negative 09/03/2019 0937   BILIRUBINUR Negative 08/08/2013 1416   KETONESUR NEGATIVE 09/18/2020 2010   PROTEINUR NEGATIVE 09/18/2020 2010   UROBILINOGEN 0.2 09/03/2019 0937   NITRITE NEGATIVE 09/18/2020 2010   LEUKOCYTESUR NEGATIVE 09/18/2020 2010   LEUKOCYTESUR Negative 08/08/2013 1416   Sepsis Labs Invalid input(s): PROCALCITONIN,  WBC,  LACTICIDVEN Microbiology Recent Results (from the past 240 hour(s))  Resp Panel by RT-PCR (Flu A&B, Covid) Nasopharyngeal Swab     Status: None   Collection Time: 09/16/20  7:16 PM   Specimen: Nasopharyngeal Swab; Nasopharyngeal(NP) swabs in vial transport medium  Result Value Ref Range Status   SARS Coronavirus 2 by RT PCR NEGATIVE NEGATIVE Final    Comment: (NOTE) SARS-CoV-2 target nucleic acids are NOT DETECTED.  The SARS-CoV-2 RNA is generally detectable in upper respiratory specimens during the acute phase of infection. The lowest concentration of SARS-CoV-2 viral copies this assay can detect is 138 copies/mL. A negative result does not preclude SARS-Cov-2 infection and should not be used as the sole basis for treatment or other patient management decisions. A negative result may occur with  improper specimen collection/handling, submission of specimen other than nasopharyngeal swab, presence of viral mutation(s) within the areas targeted by this assay, and inadequate number of viral copies(<138 copies/mL). A negative result must be combined with clinical observations, patient history, and epidemiological information. The expected result is Negative.  Fact Sheet for Patients:  EntrepreneurPulse.com.au  Fact Sheet for Healthcare Providers:  IncredibleEmployment.be  This test is no t yet approved or cleared by the  [place] FDA and  has been authorized for detection and/or diagnosis of SARS-CoV-2 by FDA under an Emergency Use Authorization (EUA). This EUA will remain  in effect (meaning this test can be used) for the duration of the COVID-19 declaration under Section 564(b)(1) of the Act, 21 U.S.C.section 360bbb-3(b)(1), unless the authorization is terminated  or revoked sooner.       Influenza A by PCR NEGATIVE NEGATIVE Final   Influenza B by PCR NEGATIVE NEGATIVE Final    Comment: (NOTE) The Xpert Xpress SARS-CoV-2/FLU/RSV plus assay is intended as an aid in the diagnosis of influenza from Nasopharyngeal swab specimens and should not be used as a sole basis for treatment. Nasal washings and aspirates are unacceptable for Xpert Xpress SARS-CoV-2/FLU/RSV testing.  Fact Sheet for Patients: EntrepreneurPulse.com.au  Fact Sheet for Healthcare Providers: IncredibleEmployment.be  This test is not yet approved or cleared by the [place] FDA and has been authorized for detection and/or diagnosis of SARS-CoV-2 by FDA under an Emergency Use Authorization (EUA). This EUA will remain in effect (meaning this test can be used) for the duration of the COVID-19 declaration under Section 564(b)(1) of the Act, 21 U.S.C. section 360bbb-3(b)(1), unless the authorization is terminated or revoked.  Performed at [facility], [address] [place], [place] [address]   Resp Panel by RT-PCR (Flu A&B, Covid) Nasopharyngeal Swab     Status: None   Collection Time: 09/18/20 10:14 PM   Specimen: Nasopharyngeal Swab; Nasopharyngeal(NP) swabs in vial transport medium  Result Value Ref Range Status   SARS Coronavirus 2 by RT PCR NEGATIVE NEGATIVE Final    Comment: (NOTE) SARS-CoV-2 target nucleic acids are NOT DETECTED.  The SARS-CoV-2 RNA is generally detectable in upper respiratory specimens during the acute phase of infection. The lowest concentration  of SARS-CoV-2 viral copies this assay can detect is 138 copies/mL. A negative result does not preclude SARS-Cov-2 infection and should not be used as the sole basis for treatment or other patient management decisions. A negative result may occur with  improper specimen collection/handling, submission of specimen other than nasopharyngeal swab, presence of viral mutation(s) within the areas targeted by this assay, and inadequate number of viral copies(<138 copies/mL). A negative result must be combined with clinical observations, patient history, and epidemiological information. The expected result is Negative.  Fact Sheet for Patients:  EntrepreneurPulse.com.au  Fact Sheet for Healthcare Providers:  IncredibleEmployment.be  This test is no t yet approved or cleared by the [place] FDA and  has been authorized for detection and/or diagnosis of SARS-CoV-2 by FDA under an Emergency Use Authorization (EUA). This EUA will remain  in effect (meaning this test can be used) for the duration of the COVID-19 declaration under Section 564(b)(1) of the Act, 21 U.S.C.section 360bbb-3(b)(1), unless the authorization is terminated  or revoked sooner.       Influenza A by PCR NEGATIVE NEGATIVE Final   Influenza B by PCR NEGATIVE NEGATIVE Final    Comment: (NOTE) The Xpert Xpress SARS-CoV-2/FLU/RSV plus assay is intended as an aid in the diagnosis of influenza from Nasopharyngeal swab specimens and should not be used as a sole basis for treatment. Nasal washings and aspirates are unacceptable for Xpert Xpress SARS-CoV-2/FLU/RSV testing.  Fact Sheet for Patients: EntrepreneurPulse.com.au  Fact Sheet for Healthcare Providers: IncredibleEmployment.be  This test is not yet approved or cleared by the [place] FDA and has been authorized for detection and/or diagnosis of SARS-CoV-2 by FDA under an Emergency Use  Authorization (EUA). This EUA will remain in effect (meaning this test can be used) for the duration of the COVID-19 declaration under Section 564(b)(1) of the Act, 21 U.S.C. section 360bbb-3(b)(1), unless the authorization is terminated or revoked.  Performed at [facility], [address] [place], [place] [address]      Time coordinating discharge: Over 30 minutes  SIGNED:   Darliss Cheney, MD  [facility] 09/19/2020, 1:32 PM  If 7PM-7AM, please contact night-coverage [email or web address]

## 2020-09-19 NOTE — ED Provider Notes (Signed)
Emergency Medicine Observation Re-evaluation Note  Cindy Fitzpatrick is a 84 y.o. female, seen on rounds today.  Pt initially presented to the ED for complaints of Psychiatric Evaluation (SI attempt) Currently, the patient is stable no complaints.  Physical Exam  BP (!) 93/49 (BP Location: Right Arm)   Pulse 88   Temp 98.1 F (36.7 C) (Oral)   Resp (!) 29   Ht 5\' 3"  (1.6 m)   Wt 73.1 kg   SpO2 98%   BMI 28.55 kg/m  Physical Exam General: No apparent distress HEENT: moist mucous membranes CV: RRR Pulm: Normal WOB GI: soft and non tender MSK: no edema or cyanosis Neuro: face symmetric, moving all extremities    ED Course / MDM    I have reviewed the labs performed to date as well as medications administered while in observation. No acute changes overnight or any new labs this morning.  Plan  Current plan is for placement.    Rudene Re, MD 09/19/20 [phone]

## 2020-09-19 NOTE — Evaluation (Signed)
Clinical/Bedside Swallow Evaluation Patient Details  Name: Cindy Fitzpatrick MRN: [id number] Date of Birth: 02/22/1936  Today's Date: 09/19/2020 Time: SLP Start Time (ACUTE ONLY): 8786 SLP Stop Time (ACUTE ONLY): 1245 SLP Time Calculation (min) (ACUTE ONLY): 10 min  Past Medical History:  Past Medical History:  Diagnosis Date  . Arthritis   . Blood in stool   . Cancer ([place])    skin  . Chicken pox    SHINGLES TWICE  . Colon polyps   . Depression   . Diverticulitis   . GERD (gastroesophageal reflux disease)   . Hay fever   . Heart disease   . Heart murmur   . Hyperlipidemia   . UTI (urinary tract infection)   . Varicose veins of both lower extremities    Past Surgical History:  Past Surgical History:  Procedure Laterality Date  . HERNIA REPAIR    . KNEE SURGERY    . ORIF ANKLE FRACTURE Right 05/22/2018   Procedure: OPEN REDUCTION INTERNAL FIXATION (ORIF) TRIMALLEOLAR  ANKLE FRACTURE;  Surgeon: Leim Fabry, MD;  Location: [facility] ORS;  Service: Orthopedics;  Laterality: Right;   HPI:  Pt is an 84 y/o who presented to the ED on 09/14/2020 with SI. Most recently pt refused to eat d/t pt report of esophageal deficits. Pt currently NPO awaiting Bedside Swallow Evaluation. Pt doesn't have any history of dysphagia or esophageal deficits in her chart. Pt is pleasantly confused at this and is disoriented, poor historian. Chest x-ray clear for any acute abnormalities.    Assessment / Plan / Recommendation Clinical Impression  Pt demonstrates adequate oropharyngeal abilities when consuming solids, puree and thin liquids via straw. Pt is poor historian and doesn't endorse any difficulties with swallowing at this time. Pt is appropriate to return to regular diet, thin liquids via straw and medicine whole with thin liquids. Pt's intake may fluctuate as a result of current medical diagnoses. ST intervention is not indicated at this time.  SLP Visit Diagnosis: Dysphagia, unspecified (R13.10)     Aspiration Risk  No limitations    Diet Recommendation Regular;Thin liquid   Liquid Administration via: Straw;Cup Medication Administration: Whole meds with liquid Supervision: Patient able to self feed;Full supervision/cueing for compensatory strategies Compensations: Minimize environmental distractions;Slow rate;Small sips/bites Postural Changes: Seated upright at 90 degrees;Remain upright for at least 30 minutes after po intake    Other  Recommendations Oral Care Recommendations: Oral care BID   Follow up Recommendations None      Frequency and Duration   N/A         Prognosis   N/A     Swallow Study   General Date of Onset: 09/18/20 HPI: Pt is an 84 y/o who presented to the ED on 09/14/2020 with SI. Most recently pt refused to eat d/t pt report of esophageal deficits. Pt currently NPO awaiting Bedside Swallow Evaluation. Pt doesn't have any history of dysphagia or esophageal deficits in her chart. Pt is pleasantly confused at this and is disoriented, poor historian. Chest x-ray clear for any acute abnormalities.  Type of Study: Bedside Swallow Evaluation Previous Swallow Assessment: none in chart Diet Prior to this Study: NPO Temperature Spikes Noted: No Respiratory Status: Room air History of Recent Intubation: No Behavior/Cognition: Alert;Cooperative;Pleasant mood;Confused Oral Cavity Assessment: Within Functional Limits Oral Cavity - Dentition: Adequate natural dentition Vision: Functional for self-feeding Self-Feeding Abilities: Able to feed self Patient Positioning: Upright in bed Baseline Vocal Quality: Normal Volitional Cough: Strong Volitional Swallow: Able to elicit  Oral/Motor/Sensory Function Overall Oral Motor/Sensory Function: Within functional limits   Ice Chips Ice chips: Not tested   Thin Liquid Thin Liquid: Within functional limits Presentation: Self Fed;Straw    Nectar Thick Nectar Thick Liquid: Not tested   Honey Thick Honey Thick Liquid:  Not tested   Puree Puree: Within functional limits Presentation: Self Fed;Spoon   Solid     Solid: Within functional limits Presentation: Self Fed     Cindy Fitzpatrick B. Cindy Fitzpatrick M.S., CCC-SLP, [place] Office [phone]  Cindy Fitzpatrick 09/19/2020,1:00 PM

## 2020-09-19 NOTE — Progress Notes (Signed)
PT Cancellation Note  Patient Details Name: Cindy Fitzpatrick MRN: [id number] DOB: 09/03/1936   Cancelled Treatment:    Reason Eval/Treat Not Completed:  (Consult received and chart reviewed. Per discussion with attending physician, patient scheduled for discharge.  No need for acute PT prior to discharge; advised discontinuation of order.   Please re-consult should needs change.)   [name] H. Owens Shark, PT, DPT, NCS 09/19/20, 1:34 PM [phone]

## 2020-09-19 NOTE — ED Notes (Signed)
Admitting provider rounded on pt and will be ordering PT consult for pt He also requested that social worker be contacted regarding placement

## 2020-09-19 NOTE — Consult Note (Addendum)
Date of Consultation:  09/19/2020  Requesting Physician:  Eugenie Norrie, MD  Reason for Consultation:  Abdominal pain  History of Present Illness: Cindy Fitzpatrick is a 84 y.o. female who has been in the emergency room since 09/14/2020 due to suicidal attempt.  The patient was currently pending bed placement but she was noted to be having some right right lower quadrant abdominal pain.  CT scan of the abdomen and pelvis was obtained yesterday and the patient was found to have a large right paraumbilical hernia.  Her white blood cell count has alternated between 11.5 and 10.6 on the last 2 days.  Creatinine has been normal.  Her lactic acid yesterday was 2.1 but normalized this morning to 0.8 after IV fluid hydration.  Today on my evaluation, the patient denies any abdominal pain but reports having some back pain from prior back issues.  Overall the patient is somewhat confused is not a reliable historian.  She reports that she has had surgery in every part of her body.  From her CT scan, I can see that she has had a cholecystectomy and a hysterectomy.  On her records, the patient has had a ventral hernia repair with Dr. Pat Patrick in 03/2010.  There is a prior H&P in 2014 that mentions that she has had surgical history of lower extremity vein ligation, repair of cystocele and rectocele, hysterectomy, cholecystectomy, appendectomy, repair of right femoral artery tear, and knee surgeries.  Past Medical History: Past Medical History:  Diagnosis Date  . Arthritis   . Blood in stool   . Cancer ([place])    skin  . Chicken pox    SHINGLES TWICE  . Colon polyps   . Depression   . Diverticulitis   . GERD (gastroesophageal reflux disease)   . Hay fever   . Heart disease   . Heart murmur   . Hyperlipidemia   . UTI (urinary tract infection)   . Varicose veins of both lower extremities      Past Surgical History: Past Surgical History:  Procedure Laterality Date  . HERNIA REPAIR    . KNEE SURGERY    . ORIF  ANKLE FRACTURE Right 05/22/2018   Procedure: OPEN REDUCTION INTERNAL FIXATION (ORIF) TRIMALLEOLAR  ANKLE FRACTURE;  Surgeon: Leim Fabry, MD;  Location: [facility] ORS;  Service: Orthopedics;  Laterality: Right;    Home Medications: Prior to Admission medications   Medication Sig Start Date End Date Taking? Authorizing Provider  acetaminophen (TYLENOL) 325 MG tablet Take 650 mg by mouth every 8 (eight) hours as needed for mild pain.   Yes [provider]  alum & mag hydroxide-simeth (MAALOX PLUS) 400-400-40 MG/5ML suspension Take 30 mLs by mouth every 4 (four) hours as needed for indigestion.    Yes [provider]  bisacodyl (DULCOLAX) 10 MG suppository Place 10 mg rectally daily as needed for moderate constipation. 05/24/18  Yes [provider]  buPROPion (WELLBUTRIN SR) 150 MG 12 hr tablet Take 150 mg by mouth daily.   Yes [provider]  carboxymethylcellulose (REFRESH PLUS) 0.5 % SOLN Place 2 drops into the right eye 2 (two) times daily as needed (redness).   Yes [provider]  cloNIDine (CATAPRES) 0.1 MG tablet TAKE 1 TABLET BY MOUTH TWICE DAILY Patient taking differently: Take 0.1 mg by mouth 2 (two) times daily.  10/16/19  Yes [name], MD  diltiazem (CARDIZEM) 30 MG tablet TAKE ONE TABLET 3 TIMES DAILY Patient taking differently: Take 30 mg by mouth  3 (three) times daily.  10/08/19  Yes [name], MD  losartan-hydrochlorothiazide (HYZAAR) 50-12.5 MG tablet TAKE ONE TABLET EVERY DAY Patient taking differently: Take 1 tablet by mouth daily.  12/17/19  Yes [name], MD  nitroGLYCERIN (NITRODUR - DOSED IN MG/24 HR) 0.4 mg/hr patch APPLY 1 PATCH DAILY.LEAVE ON FOR 12-14 HOURS & REMOVE FOR 10-12 HOURS BEFORE APPLYING THE NEXT PATCH Patient taking differently: Place 0.4 mg onto the skin daily. (remove after 12 hours and apply new patch after additional 12 hours) 10/08/19  Yes [name], MD  polyethylene glycol  (MIRALAX / GLYCOLAX) 17 g packet Take 17 g by mouth daily.   Yes [provider]  potassium chloride (KLOR-CON) 10 MEQ tablet TAKE ONE TABLET EVERY DAY Patient taking differently: Take 10 mEq by mouth daily.  12/18/19  Yes [name], MD  QUEtiapine (SEROQUEL) 25 MG tablet Take 25 mg by mouth 2 (two) times daily.   Yes [provider]  sertraline (ZOLOFT) 100 MG tablet TAKE TWO TABLETS AT BEDTIME Patient taking differently: Take 200 mg by mouth daily.  10/18/19  Yes [name], MD    Allergies: Allergies  Allergen Reactions  . Codeine Other (See Comments)    Other reaction(s): Headache  . Hydrocodone-Acetaminophen Nausea And Vomiting  . Meperidine     Other reaction(s): Dizziness  . Niacin Er Other (See Comments)    Patient doesn't remember  . Other Nausea Only  . Propoxyphene Other (See Comments)    Other reaction(s): Dizziness  . Solifenacin Other (See Comments)    Other reaction(s): Abdominal Pain  . Statins     Other reaction(s): Muscle Pain Weakness  . Sulfa Antibiotics Other (See Comments)    Patient doesn' t remember  . Fish-Derived Products Rash    Weakness  . Nabumetone Rash  . Oxycodone-Acetaminophen Rash    Couldn't think or remember anything    Social History:  reports that she has never smoked. She has never used smokeless tobacco. She reports that she does not drink alcohol and does not use drugs.   Family History: Family History  Problem Relation Age of Onset  . Stroke Mother   . Heart attack Father     Review of Systems: Review of Systems  Constitutional: Negative for chills and fever.  HENT: Negative for hearing loss.   Respiratory: Negative for shortness of breath.   Cardiovascular: Negative for chest pain.  Gastrointestinal: Negative for abdominal pain, nausea and vomiting.  Genitourinary: Negative for dysuria.  Musculoskeletal: Positive for back pain.  Skin: Negative for rash.  Neurological: Negative for  dizziness.  Psychiatric/Behavioral: Positive for suicidal ideas.    Physical Exam BP (!) 112/44   Pulse 87   Temp 98.7 F (37.1 C)   Resp 17   Ht 5\' 3"  (1.6 m)   Wt 73.1 kg   SpO2 98%   BMI 28.55 kg/m  CONSTITUTIONAL: No acute distress HEENT:  Normocephalic, atraumatic, extraocular motion intact. NECK: Trachea is midline, and there is no jugular venous distension. RESPIRATORY:  Normal respiratory effort without pathologic use of accessory muscles. CARDIOVASCULAR: Regular rhythm and rate. GI: The abdomen is soft, nondistended currently nontender to palpation.  The patient does have a large right paraumbilical hernia which appears to contain loops of small bowel.  This is partially reducible and is soft with no overlying erythema or ulceration and nontender.  Patient has a well-healed scar from prior open cholecystectomy as well as a low midline incision.  MUSCULOSKELETAL:  Normal muscle strength and tone in all four extremities.  No peripheral edema or cyanosis. SKIN: Skin turgor is normal. There are no pathologic skin lesions.  NEUROLOGIC:  Motor and sensation is grossly normal.  Cranial nerves are grossly intact. PSYCH: Patient is alerted to name and knows who the president is, but thought the year was 2022.  Laboratory Analysis: Results for orders placed or performed during the hospital encounter of 09/14/20 (from the past 24 hour(s))  Urinalysis, Complete w Microscopic     Status: Abnormal   Collection Time: 09/18/20  8:10 PM  Result Value Ref Range   Color, Urine YELLOW (A) YELLOW   APPearance CLEAR (A) CLEAR   Specific Gravity, Urine 1.021 1.005 - 1.030   pH 5.0 5.0 - 8.0   Glucose, UA 50 (A) NEGATIVE mg/dL   Hgb urine dipstick NEGATIVE NEGATIVE   Bilirubin Urine NEGATIVE NEGATIVE   Ketones, ur NEGATIVE NEGATIVE mg/dL   Protein, ur NEGATIVE NEGATIVE mg/dL   Nitrite NEGATIVE NEGATIVE   Leukocytes,Ua NEGATIVE NEGATIVE   RBC / HPF 0-5 0 - 5 RBC/hpf   WBC, UA 0-5 0 - 5  WBC/hpf   Bacteria, UA RARE (A) NONE SEEN   Squamous Epithelial / LPF 0-5 0 - 5  CBC     Status: Abnormal   Collection Time: 09/18/20  8:10 PM  Result Value Ref Range   WBC 10.6 (H) 4.0 - 10.5 K/uL   RBC 3.80 (L) 3.87 - 5.11 MIL/uL   Hemoglobin 9.2 (L) 12.0 - 15.0 g/dL   HCT 29.9 (L) 36 - 46 %   MCV 78.7 (L) 80.0 - 100.0 fL   MCH 24.2 (L) 26.0 - 34.0 pg   MCHC 30.8 30.0 - 36.0 g/dL   RDW 15.8 (H) 11.5 - 15.5 %   Platelets 192 150 - 400 K/uL   nRBC 0.0 0.0 - 0.2 %  Basic metabolic panel     Status: Abnormal   Collection Time: 09/18/20  8:10 PM  Result Value Ref Range   Sodium 134 (L) 135 - 145 mmol/L   Potassium 3.7 3.5 - 5.1 mmol/L   Chloride 102 98 - 111 mmol/L   CO2 24 22 - 32 mmol/L   Glucose, Bld 202 (H) 70 - 99 mg/dL   BUN 23 8 - 23 mg/dL   Creatinine, Ser 0.80 0.44 - 1.00 mg/dL   Calcium 8.4 (L) 8.9 - 10.3 mg/dL   GFR, Estimated >60 >60 mL/min   Anion gap 8 5 - 15  Lactic acid, plasma     Status: Abnormal   Collection Time: 09/18/20  8:10 PM  Result Value Ref Range   Lactic Acid, Venous 2.1 (HH) 0.5 - 1.9 mmol/L  Resp Panel by RT-PCR (Flu A&B, Covid) Nasopharyngeal Swab     Status: None   Collection Time: 09/18/20 10:14 PM   Specimen: Nasopharyngeal Swab; Nasopharyngeal(NP) swabs in vial transport medium  Result Value Ref Range   SARS Coronavirus 2 by RT PCR NEGATIVE NEGATIVE   Influenza A by PCR NEGATIVE NEGATIVE   Influenza B by PCR NEGATIVE NEGATIVE  Basic metabolic panel     Status: Abnormal   Collection Time: 09/19/20  5:48 AM  Result Value Ref Range   Sodium 137 135 - 145 mmol/L   Potassium 3.6 3.5 - 5.1 mmol/L   Chloride 104 98 - 111 mmol/L   CO2 22 22 - 32 mmol/L   Glucose, Bld 130 (H) 70 - 99 mg/dL   BUN 20  8 - 23 mg/dL   Creatinine, Ser 0.69 0.44 - 1.00 mg/dL   Calcium 8.3 (L) 8.9 - 10.3 mg/dL   GFR, Estimated >60 >60 mL/min   Anion gap 11 5 - 15  CBC     Status: Abnormal   Collection Time: 09/19/20  5:48 AM  Result Value Ref Range   WBC 10.6  (H) 4.0 - 10.5 K/uL   RBC 3.54 (L) 3.87 - 5.11 MIL/uL   Hemoglobin 8.5 (L) 12.0 - 15.0 g/dL   HCT 28.2 (L) 36 - 46 %   MCV 79.7 (L) 80.0 - 100.0 fL   MCH 24.0 (L) 26.0 - 34.0 pg   MCHC 30.1 30.0 - 36.0 g/dL   RDW 15.6 (H) 11.5 - 15.5 %   Platelets 171 150 - 400 K/uL   nRBC 0.0 0.0 - 0.2 %  Lactic acid, plasma     Status: None   Collection Time: 09/19/20  8:00 AM  Result Value Ref Range   Lactic Acid, Venous 0.8 0.5 - 1.9 mmol/L    Imaging: CT ABDOMEN PELVIS W CONTRAST  Result Date: 09/18/2020 CLINICAL DATA:  84 year old with acute generalized abdominal pain. EXAM: CT ABDOMEN AND PELVIS WITH CONTRAST TECHNIQUE: Multidetector CT imaging of the abdomen and pelvis was performed using the standard protocol following bolus administration of intravenous contrast. CONTRAST:  125mL OMNIPAQUE IOHEXOL 300 MG/ML IV. COMPARISON:  04/22/2016. FINDINGS: Respiratory motion blurred images of the lung bases and the upper abdomen. Lower chest: Visualized lung bases clear apart from the expected dependent atelectasis posteriorly. Heart moderately enlarged. Severe aortic valvular calcification. RIGHT coronary artery atherosclerosis. Hepatobiliary: Surgically absent gallbladder which accounts for the intra and extrahepatic biliary ductal dilation, unchanged since the 2017 CT. Liver normal in size without significant focal parenchymal abnormality. Pancreas: Mildly atrophic.  No mass or peripancreatic inflammation. Spleen: Normal in size and appearance. Adrenals/Urinary Tract: Normal appearing adrenal glands. Scarring involving the RIGHT kidney, unchanged. Extrarenal pelves bilaterally. Cortical cysts in both kidneys. No solid masses involving either kidney. No urinary tract calculi. Normal appearing mildly distended urinary bladder. Stomach/Bowel: Small hiatal hernia. Stomach otherwise normal in appearance. Normal-appearing small bowel. Very large stool burden throughout the colon. Extensive sigmoid colon  diverticulosis without evidence of acute diverticulitis. Appendix not conspicuous but no evidence of pericecal inflammation. Vascular/Lymphatic: Severe aortoiliofemoral and visceral artery atherosclerosis without evidence of aneurysm. Normal-appearing portal venous and systemic venous systems. No pathologic lymphadenopathy. Reproductive: Uterus not present and presumed surgically absent. No adnexal masses. Other: Large paraumbilical hernia just to the RIGHT of midline containing numerous loops of small bowel without evidence of strangulation or incarceration. Musculoskeletal: Thoracolumbar levoscoliosis and compensatory lower lumbar dextroscoliosis. Osseous demineralization. Degenerative disc disease and spondylosis throughout the lower thoracic spine and lumbar spine. Grade 1 spondylolisthesis of L4 on L5 and grade 2 spondylolisthesis of L5 on S1 as noted previously. No acute findings. IMPRESSION: 1. No acute abnormalities involving the abdomen or pelvis. 2. Very large stool burden throughout the colon. 3. Extensive sigmoid colon diverticulosis without evidence of acute diverticulitis. 4. Large paraumbilical hernia just to the RIGHT of midline containing numerous loops of small bowel without evidence of strangulation or incarceration. 5. Small hiatal hernia. Aortic Atherosclerosis (ICD10-I70.0). Electronically Signed   By: Evangeline Dakin M.D.   On: 09/18/2020 21:13   DG Chest Portable 1 View  Result Date: 09/18/2020 CLINICAL DATA:  Cough elevated lactic acid difficulty swallowing EXAM: PORTABLE CHEST 1 VIEW COMPARISON:  04/22/2016 FINDINGS: Chronic elevation right diaphragm. No consolidation or effusion.  Stable cardiomediastinal silhouette with aortic atherosclerosis. No pneumothorax. IMPRESSION: No active disease. Electronically Signed   By: Donavan Foil M.D.   On: 09/18/2020 22:28    Assessment and Plan: This is a 84 y.o. female with suicidal attempt and abdominal pain.  -On my evaluation, the  patient did not have any further abdominal pain.  Her lactic acidosis was mild and resolved with IV fluid hydration.  I personally viewed the CT scan images.  Overall there is a large amount of stool in her colon this could be was contributing to her abdominal discomfort.  Recommended that she have a consistent bowel regimen to help with her bowel movements.  The patient's right sided hernia has been enlarging through the years and contain small loops of small bowel at this point compared to previous CT scans many years ago.  The hernia is partially reducible and there is no evidence of any obstruction, strangulation, or other concerns.  The patient has had a cholecystectomy already and in theory an appendectomy as well.  I am not able to visualize the appendix on her CT scan but there is no evidence of any stranding or inflammation in the right lower quadrant.  At this point the patient does not need any urgent surgery.  Her diet may be advanced again and medical/psychiatric management per primary team. -Please feel free to consult [place] if there are any other questions or concerns.  Face-to-face time spent with the patient and care providers was 55 minutes, with more than 50% of the time spent counseling, educating, and coordinating care of the patient.     Melvyn Neth, MD [facility] Pg:  [phone]

## 2020-09-19 NOTE — ED Notes (Signed)
Admitting provider d/c'd NaCl

## 2020-09-20 NOTE — BH Assessment (Signed)
Writer spoke with [facility] several times today (09/20/2020) and they were unable to review the patient information. Was advised to try tomorrow (09/21/2020) because they have two possible discharges.

## 2020-09-20 NOTE — ED Notes (Signed)
Son states that during visit he tried redirecting her the whole time and that she was talking about a little boy that wasn't there. Pt remains AOx4 with this RN.

## 2020-09-20 NOTE — ED Notes (Signed)
Patient is IVC pending placement 

## 2020-09-20 NOTE — ED Notes (Signed)
Pt remains on medical monitoring due to being recently d/c from admission care.

## 2020-09-20 NOTE — Progress Notes (Signed)
Patient pleasantly sitting in bed and watching TV during rounds. When asked patient during rounds about the man at the nursing home, patient responded with "I am done talking about that story, I've said it enough." Patient currently denies SI/HI/AVH. Patient continues to be stable and taking prescribed medications. Awaiting response from [facility], this is the last Geriatric facility that has not denied her based on her ability to ambulate.   [place]

## 2020-09-20 NOTE — BH Assessment (Addendum)
Re-faxed Patient to following facilities;   . [facility] [phone]), No answer  . [facility] [phone] -[phone]) [phone] No admissions staff until [place] [phone], [phone], [phone] or [phone]), No admissions staff until the morning   . [facility] [phone] or [phone]) No beds available at this time  . [place] [phone] or [phone]),

## 2020-09-20 NOTE — ED Provider Notes (Signed)
Emergency Medicine Observation Re-evaluation Note  Cindy Fitzpatrick is a 84 y.o. female, seen on rounds today.  Pt initially presented to the ED for complaints of Psychiatric Evaluation (SI attempt) Currently, the patient is resting, voices no medical complaints.  Physical Exam  BP (!) 128/59   Pulse 90   Temp 98.7 F (37.1 C) (Oral)   Resp (!) 23   Ht 5\' 3"  (1.6 m)   Wt 73.1 kg   SpO2 95%   BMI 28.55 kg/m  Physical Exam General: Resting in no acute distress Cardiac: No cyanosis Lungs: Equal rise and fall Psych: Not agitated  ED Course / MDM  EKG:EKG Interpretation  Date/Time:  Sunday September 14 2020 19:46:00 EST Ventricular Rate:  82 PR Interval:  182 QRS Duration: 140 QT Interval:  408 QTC Calculation: 476 R Axis:   72 Text Interpretation: Normal sinus rhythm Right bundle branch block Abnormal ECG Confirmed by UNCONFIRMED, DOCTOR ([id number]), editor Mel Almond, Tammy [phone]) on 09/16/2020 2:30:24 PM    I have reviewed the labs performed to date as well as medications administered while in observation.  Recent changes in the last 24 hours include patient was briefly hospitalized overnight to the hospital service for dehydration and hypotension.  Blood pressures have normalized.  She received a surgical consult for periumbilical hernia and deemed no acute surgical intervention at this time.  She was subsequently discharged from the medical service and currently remains in the ED pending geri-psychiatric acceptance.  Plan  Current plan is for [facility] psych acceptance. Patient is under full IVC at this time.   Paulette Blanch, MD 09/20/20 [phone]

## 2020-09-20 NOTE — ED Notes (Signed)
Patient repositioned

## 2020-09-20 NOTE — ED Notes (Signed)
Psych team at bedside .

## 2020-09-20 NOTE — ED Notes (Signed)
Pt's IVs removed. Pt cleaned with wipes. New linens. New purewick. Pt positioned on right side for now to get pressure off her bottom. Redness noted. Pt able to feed self and ate about 70% of meal tray.

## 2020-09-20 NOTE — ED Notes (Signed)
Pt noted to be sleeping and oxygen dropped to 87-88% on room air, when pt woke up, oxygen went up to 93%. Pt was placed on 2lpm via [place], to be worn while sleeping. ER provider notified.

## 2020-09-20 NOTE — ED Notes (Signed)
Pt has son at bedside. Pt given dinner tray.

## 2020-09-21 LAB — HEMOGLOBIN AND HEMATOCRIT, BLOOD
HCT: 32 % — ABNORMAL LOW (ref 36.0–46.0)
Hemoglobin: 9.6 g/dL — ABNORMAL LOW (ref 12.0–15.0)

## 2020-09-21 LAB — RESP PANEL BY RT-PCR (FLU A&B, COVID) ARPGX2
Influenza A by PCR: NEGATIVE
Influenza B by PCR: NEGATIVE
SARS Coronavirus 2 by RT PCR: NEGATIVE

## 2020-09-21 NOTE — ED Notes (Signed)
Pt ate about 15% of lunch. Pt stating she was no longer hungry when prompted with more food. Pt positioned in bed and comfortable at this time. Mold found on fruit given on lunch tray. Dietary made aware.

## 2020-09-21 NOTE — ED Notes (Signed)
Hourly rounding reveals patient in room. No complaints, stable, in no acute distress. Q15 minute rounds and monitoring via Rover and Officer to continue.   

## 2020-09-21 NOTE — ED Notes (Signed)
Report to include Situation, Background, Assessment, and Recommendations received from Amy RN. Patient alert and oriented, warm and dry, in no acute distress.UTA SI, HI, AVH and pain due to dementia. Patient made aware of Q15 minute rounds and Engineer, drilling presence for their safety.

## 2020-09-21 NOTE — ED Notes (Signed)
Per [facility] no acceptance from [facility] at this time 1715  still waiting, Informed  [place] papers expire at 2100, Waylan Boga told me she would let the papers expire if patient not excepted to [facility] and if accepted will renew papers

## 2020-09-21 NOTE — BH Assessment (Addendum)
TTS spoke to [facility] [phone]) who reports to be currently reviewing referrals and agreed to contact TTS with a decision.  Cindy Fitzpatrick also reports to be unaware of any updates at this time.

## 2020-09-21 NOTE — ED Notes (Signed)
Hourly rounding reveals patient in room. No complaints, stable, in no acute distress. Q15 minute rounds and monitoring via Security Cameras to continue. 

## 2020-09-21 NOTE — BH Assessment (Addendum)
TTS made contact with [facility]) who request to be fax pt's EKG,chest xray, updated COVID and hemoglobin levels.  before decision is made.   TTS updated pt's current RN (Amy B.) and MD Charlsie Quest)  TTS faxed pt's EKG & Chest xray to Jo at 5:52pm.

## 2020-09-21 NOTE — ED Provider Notes (Signed)
Emergency Medicine Observation Re-evaluation Note  Cindy Fitzpatrick is a 84 y.o. female, seen on rounds today.  Pt initially presented to the ED for complaints of Psychiatric Evaluation (SI attempt) Currently, the patient is calm with no complaints.  Physical Exam  BP (!) 142/61   Pulse 96   Temp 98.8 F (37.1 C) (Axillary)   Resp (!) 28   Ht 5\' 3"  (1.6 m)   Wt 73.1 kg   SpO2 95%   BMI 28.55 kg/m  Physical Exam General: No apparent distress HEENT: moist mucous membranes CV: RRR Pulm: Normal WOB GI: soft and non tender MSK: no edema or cyanosis Neuro: face symmetric, moving all extremities    ED Course / MDM  I have reviewed the labs performed to date as well as medications administered while in observation.  No changes overnight or new labs this morning  Plan  Current plan is for placement.    Rudene Re, MD 09/21/20 [phone]

## 2020-09-21 NOTE — ED Notes (Signed)
Pt ate 25% of her breakfast and 86mL of water.

## 2020-09-21 NOTE — ED Notes (Signed)
Pt repositioned in bed. Pure wick in place. Pt bedding and chux dry at this time.

## 2020-09-21 NOTE — ED Notes (Signed)
Pt refusing to let staff perform Covid swab.  NT and RN explained why this test was necessary, but patient kept stating she didn't even know who she was.  Attempted to explain this would help patient find placement and leave the ER, burt she did not understand.  Will try again later.

## 2020-09-22 DIAGNOSIS — F3164 Bipolar disorder, current episode mixed, severe, with psychotic features: Principal | ICD-10-CM

## 2020-09-22 NOTE — ED Notes (Signed)
Updated labs and COVID results sent to referral source [facility])

## 2020-09-22 NOTE — ED Provider Notes (Signed)
Emergency Medicine Observation Re-evaluation Note  Cindy Fitzpatrick is a 84 y.o. female, seen on rounds today.  Pt initially presented to the ED for complaints of Psychiatric Evaluation (SI attempt) Currently, the patient is resting, voices no medical complaints.  Physical Exam  BP (!) 135/47 (BP Location: Right Arm)   Pulse 85   Temp 98.9 F (37.2 C) (Oral)   Resp (!) 24   Ht 5\' 3"  (1.6 m)   Wt 73.1 kg   SpO2 95%   BMI 28.55 kg/m  Physical Exam General: Resting in no acute distress Cardiac: No cyanosis Lungs: Equal rise and fall Psych: Not agitated  ED Course / MDM  EKG:EKG Interpretation  Date/Time:  Sunday September 14 2020 19:46:00 EST Ventricular Rate:  82 PR Interval:  182 QRS Duration: 140 QT Interval:  408 QTC Calculation: 476 R Axis:   72 Text Interpretation: Normal sinus rhythm Right bundle branch block Abnormal ECG Confirmed by UNCONFIRMED, DOCTOR ([id number]), editor Mel Almond, Tammy [phone]) on 09/16/2020 2:30:24 PM    I have reviewed the labs performed to date as well as medications administered while in observation.  Recent changes in the last 24 hours include no events overnight.  Plan  Current plan is for pending review by [facility] for acceptance. Patient is under full IVC at this time.   Paulette Blanch, MD 09/22/20 [phone]

## 2020-09-22 NOTE — BH Assessment (Addendum)
TTS was contacted by [facility] requesting IVC paperwork after not receiving earlier this afternoon. TTS completed task, fax sent to [phone] & [phone]).   Mary agreed to contact TTS with confirmation and bed acceptance information.

## 2020-09-22 NOTE — ED Notes (Signed)
Hourly rounding reveals patient in room. No complaints, stable, in no acute distress. Q15 minute rounds and monitoring via Rover and Officer to continue.   

## 2020-09-22 NOTE — BH Assessment (Addendum)
PATIENT IS SCHEDULED FOR ADMISSION ANYTIME AFTER 7AM TOMORROW   Patient has been accepted to [facility].  Patient assigned to: [place] physician is Dr. Alonna Minium  Call report to [phone] Representative was [facility]    ER Staff is aware of it:  Anne Ng, ER Secretary  Cinda Quest, ER MD  Rush Landmark, Patient's Nurse  Patient's Family/Support System [facility]) , [phone]) have been updated as well.   Leory Plowman is asking to be contacted before pt is transported to [facility]

## 2020-09-22 NOTE — ED Notes (Signed)
INVOLUNTARY/awaiting bed assignment from [facility]

## 2020-09-22 NOTE — BH Assessment (Signed)
TTS spoke to [facility]) who reports to have a bed available for pt but acceptance can only be given after pt's IVC is faxed and received. TTS agreed to follow up with  [facility] request accordingly.   TTS update psych and will fax pt's IVC once renewed.

## 2020-09-22 NOTE — ED Notes (Addendum)
ED techs placed a new Purewick. Patient tolerated it well. Patient was given two new, warm blankets. Patient's brief was soaked.

## 2020-09-22 NOTE — ED Notes (Signed)
Room darkened for comfort.

## 2020-09-22 NOTE — ED Notes (Signed)
Patient given breakfast tray. Faith, ED tech, available to assist patient with eating.

## 2020-09-22 NOTE — Consult Note (Signed)
[facility] Consult   Reason for Consult: Follow-up note and consult for 84 year old woman who has been in the emergency room for 1 week after a suicide attempt Referring Physician: Tamala Julian Patient Identification: Cindy Fitzpatrick MRN:  [id number] Principal Diagnosis: Bipolar affective disorder, mixed, severe, with psychotic behavior ([place]) Diagnosis:  Principal Problem:   Bipolar affective disorder, mixed, severe, with psychotic behavior ([place]) Active Problems:   Abdominal pain   Total Time spent with patient: 30 minutes  Subjective:   JALEXA Fitzpatrick is a 84 y.o. female patient admitted with "I tried to kill myself".  HPI: Chart reviewed patient seen.  84 year old woman who has been in the emergency room for 1 week after being brought from her assisted living facility due to suicidal behavior.  She had cut herself superficially with a pair of scissors.  On interview today the patient remembers this and describes it as a suicide attempt.  She states she did it because she hates the place where she is living because they treated her badly.  She makes paranoid statements like feeling that you cannot trust anyone there and that they are not who they seem.  Patient was lucid and cooperative for the most part during the interview although at 1 point she appeared to be having auditory hallucination.  Patient was calm but continues to endorse suicidal ideation saying she is hopeless and has nothing to live for  Past Psychiatric History: Past history of depression.  She denies prior suicide attempts  Risk to Self: Suicidal Ideation: Yes-Currently Present Suicidal Intent: Yes-Currently Present Is patient at risk for suicide?: Yes Suicidal Plan?: Yes-Currently Present Specify Current Suicidal Plan:  (attempting to cut herself ) Access to Means: Yes Specify Access to Suicidal Means:  (was able to get ahold of a pair of scissors) Intentional Self Injurious Behavior: Cutting Risk to Others:  Homicidal Ideation: No Thoughts of Harm to Others: No Current Homicidal Intent: No Current Homicidal Plan: No Access to Homicidal Means: No Criminal Charges Pending?: No Does patient have a court date: No Prior Inpatient Therapy:   Prior Outpatient Therapy:    Past Medical History:  Past Medical History:  Diagnosis Date  . Arthritis   . Blood in stool   . Cancer ([place])    skin  . Chicken pox    SHINGLES TWICE  . Colon polyps   . Depression   . Diverticulitis   . GERD (gastroesophageal reflux disease)   . Hay fever   . Heart disease   . Heart murmur   . Hyperlipidemia   . UTI (urinary tract infection)   . Varicose veins of both lower extremities     Past Surgical History:  Procedure Laterality Date  . HERNIA REPAIR    . KNEE SURGERY    . ORIF ANKLE FRACTURE Right 05/22/2018   Procedure: OPEN REDUCTION INTERNAL FIXATION (ORIF) TRIMALLEOLAR  ANKLE FRACTURE;  Surgeon: Leim Fabry, MD;  Location: [facility] ORS;  Service: Orthopedics;  Laterality: Right;   Family History:  Family History  Problem Relation Age of Onset  . Stroke Mother   . Heart attack Father    Family Psychiatric  History: See previous notes Social History:  Social History   Substance and Sexual Activity  Alcohol Use No  . Alcohol/week: 0.0 standard drinks     Social History   Substance and Sexual Activity  Drug Use No    Social History   Socioeconomic History  . Marital status: Widowed    Spouse  name: Not on file  . Number of children: Not on file  . Years of education: Not on file  . Highest education level: Not on file  Occupational History  . Not on file  Tobacco Use  . Smoking status: Never Smoker  . Smokeless tobacco: Never Used  Vaping Use  . Vaping Use: Never used  Substance and Sexual Activity  . Alcohol use: No    Alcohol/week: 0.0 standard drinks  . Drug use: No  . Sexual activity: Never  Other Topics Concern  . Not on file  Social History Narrative  . Not on file    Social Determinants of Health   Financial Resource Strain:   . Difficulty of Paying Living Expenses: Not on file  Food Insecurity:   . Worried About Charity fundraiser in the Last Year: Not on file  . Ran Out of Food in the Last Year: Not on file  Transportation Needs:   . Lack of Transportation (Medical): Not on file  . Lack of Transportation (Non-Medical): Not on file  Physical Activity:   . Days of Exercise per Week: Not on file  . Minutes of Exercise per Session: Not on file  Stress:   . Feeling of Stress : Not on file  Social Connections:   . Frequency of Communication with Friends and Family: Not on file  . Frequency of Social Gatherings with Friends and Family: Not on file  . Attends Religious Services: Not on file  . Active Member of Clubs or Organizations: Not on file  . Attends Archivist Meetings: Not on file  . Marital Status: Not on file   Additional Social History:    Allergies:   Allergies  Allergen Reactions  . Codeine Other (See Comments)    Other reaction(s): Headache  . Hydrocodone-Acetaminophen Nausea And Vomiting  . Meperidine     Other reaction(s): Dizziness  . Niacin Er Other (See Comments)    Patient doesn't remember  . Other Nausea Only  . Propoxyphene Other (See Comments)    Other reaction(s): Dizziness  . Solifenacin Other (See Comments)    Other reaction(s): Abdominal Pain  . Statins     Other reaction(s): Muscle Pain Weakness  . Sulfa Antibiotics Other (See Comments)    Patient doesn' t remember  . Fish-Derived Products Rash    Weakness  . Nabumetone Rash  . Oxycodone-Acetaminophen Rash    Couldn't think or remember anything    Labs:  Results for orders placed or performed during the hospital encounter of 09/14/20 (from the past 48 hour(s))  Resp Panel by RT-PCR (Flu A&B, Covid) Nasopharyngeal Swab     Status: None   Collection Time: 09/21/20  5:42 PM   Specimen: Nasopharyngeal Swab; Nasopharyngeal(NP) swabs in vial  transport medium  Result Value Ref Range   SARS Coronavirus 2 by RT PCR NEGATIVE NEGATIVE    Comment: (NOTE) SARS-CoV-2 target nucleic acids are NOT DETECTED.  The SARS-CoV-2 RNA is generally detectable in upper respiratory specimens during the acute phase of infection. The lowest concentration of SARS-CoV-2 viral copies this assay can detect is 138 copies/mL. A negative result does not preclude SARS-Cov-2 infection and should not be used as the sole basis for treatment or other patient management decisions. A negative result may occur with  improper specimen collection/handling, submission of specimen other than nasopharyngeal swab, presence of viral mutation(s) within the areas targeted by this assay, and inadequate number of viral copies(<138 copies/mL). A negative result must be  combined with clinical observations, patient history, and epidemiological information. The expected result is Negative.  Fact Sheet for Patients:  EntrepreneurPulse.com.au  Fact Sheet for Healthcare Providers:  IncredibleEmployment.be  This test is no t yet approved or cleared by the [place] FDA and  has been authorized for detection and/or diagnosis of SARS-CoV-2 by FDA under an Emergency Use Authorization (EUA). This EUA will remain  in effect (meaning this test can be used) for the duration of the COVID-19 declaration under Section 564(b)(1) of the Act, 21 U.S.C.section 360bbb-3(b)(1), unless the authorization is terminated  or revoked sooner.       Influenza A by PCR NEGATIVE NEGATIVE   Influenza B by PCR NEGATIVE NEGATIVE    Comment: (NOTE) The Xpert Xpress SARS-CoV-2/FLU/RSV plus assay is intended as an aid in the diagnosis of influenza from Nasopharyngeal swab specimens and should not be used as a sole basis for treatment. Nasal washings and aspirates are unacceptable for Xpert Xpress SARS-CoV-2/FLU/RSV testing.  Fact Sheet for  Patients: EntrepreneurPulse.com.au  Fact Sheet for Healthcare Providers: IncredibleEmployment.be  This test is not yet approved or cleared by the [place] FDA and has been authorized for detection and/or diagnosis of SARS-CoV-2 by FDA under an Emergency Use Authorization (EUA). This EUA will remain in effect (meaning this test can be used) for the duration of the COVID-19 declaration under Section 564(b)(1) of the Act, 21 U.S.C. section 360bbb-3(b)(1), unless the authorization is terminated or revoked.  Performed at [facility], [address] [place], [place] [address]   Hemoglobin and hematocrit, blood     Status: Abnormal   Collection Time: 09/21/20  5:57 PM  Result Value Ref Range   Hemoglobin 9.6 (L) 12.0 - 15.0 g/dL   HCT 32.0 (L) 36 - 46 %    Comment: Performed at [facility], [address], [place], [place] [address]    Current Facility-Administered Medications  Medication Dose Route Frequency Provider Last Rate Last Admin  . acetaminophen (TYLENOL) tablet 650 mg  650 mg Oral Q6H PRN Mansy, Jan A, MD       Or  . acetaminophen (TYLENOL) suppository 650 mg  650 mg Rectal Q6H PRN Mansy, Jan A, MD      . alum & mag hydroxide-simeth (MAALOX/MYLANTA) 200-200-20 MG/5ML suspension 30 mL  30 mL Oral Q4H PRN Duffy Bruce, MD      . bisacodyl (DULCOLAX) suppository 10 mg  10 mg Rectal Daily PRN Duffy Bruce, MD      . buPROPion [facility] SR) 12 hr tablet 150 mg  150 mg Oral Daily Duffy Bruce, MD   150 mg at 09/21/20 0919  . dicyclomine (BENTYL) capsule 10 mg  10 mg Oral Once Arta Silence, MD      . enoxaparin (LOVENOX) injection 40 mg  40 mg Subcutaneous Q24H Darliss Cheney, MD   40 mg at 09/21/20 0918  . magnesium hydroxide (MILK OF MAGNESIA) suspension 30 mL  30 mL Oral Daily PRN Mansy, Jan A, MD      . ondansetron [facility]) tablet 4 mg  4 mg Oral Q6H PRN Mansy, Jan A, MD       Or  . ondansetron  [facility]) injection 4 mg  4 mg Intravenous Q6H PRN Mansy, Jan A, MD      . polyethylene glycol (MIRALAX / GLYCOLAX) packet 17 g  17 g Oral Daily Duffy Bruce, MD   17 g at 09/21/20 0931  . polyvinyl alcohol (LIQUIFILM TEARS) 1.4 % ophthalmic solution 2  drop  2 drop Right Eye BID PRN Duffy Bruce, MD      . potassium chloride SA (KLOR-CON) CR tablet 10 mEq  10 mEq Oral Daily Duffy Bruce, MD   10 mEq at 09/21/20 0919  . risperiDONE (RISPERDAL) tablet 0.5 mg  0.5 mg Oral BID Patrecia Pour, NP   0.5 mg at 09/21/20 2105  . sertraline (ZOLOFT) tablet 200 mg  200 mg Oral QHS Duffy Bruce, MD   200 mg at 09/21/20 2106  . traMADol (ULTRAM) tablet 50 mg  50 mg Oral Once Arta Silence, MD      . traZODone (DESYREL) tablet 25 mg  25 mg Oral QHS PRN Mansy, Jan A, MD   25 mg at 09/21/20 2106   Current Outpatient Medications  Medication Sig Dispense Refill  . acetaminophen (TYLENOL) 325 MG tablet Take 650 mg by mouth every 8 (eight) hours as needed for mild pain.    Marland Kitchen alum & mag hydroxide-simeth (MAALOX PLUS) 400-400-40 MG/5ML suspension Take 30 mLs by mouth every 4 (four) hours as needed for indigestion.     . bisacodyl (DULCOLAX) 10 MG suppository Place 10 mg rectally daily as needed for moderate constipation.    Marland Kitchen buPROPion (WELLBUTRIN SR) 150 MG 12 hr tablet Take 150 mg by mouth daily.    . carboxymethylcellulose (REFRESH PLUS) 0.5 % SOLN Place 2 drops into the right eye 2 (two) times daily as needed (redness).    . cloNIDine (CATAPRES) 0.1 MG tablet TAKE 1 TABLET BY MOUTH TWICE DAILY (Patient taking differently: Take 0.1 mg by mouth 2 (two) times daily. ) 180 tablet 0  . diltiazem (CARDIZEM) 30 MG tablet TAKE ONE TABLET 3 TIMES DAILY (Patient taking differently: Take 30 mg by mouth 3 (three) times daily. ) 90 tablet 1  . losartan-hydrochlorothiazide (HYZAAR) 50-12.5 MG tablet TAKE ONE TABLET EVERY DAY (Patient taking differently: Take 1 tablet by mouth daily. ) 30 tablet 2  .  nitroGLYCERIN (NITRODUR - DOSED IN MG/24 HR) 0.4 mg/hr patch APPLY 1 PATCH DAILY.LEAVE ON FOR 12-14 HOURS & REMOVE FOR 10-12 HOURS BEFORE APPLYING THE NEXT PATCH (Patient taking differently: Place 0.4 mg onto the skin daily. (remove after 12 hours and apply new patch after additional 12 hours)) 30 patch 1  . polyethylene glycol (MIRALAX / GLYCOLAX) 17 g packet Take 17 g by mouth daily.    . potassium chloride (KLOR-CON) 10 MEQ tablet TAKE ONE TABLET EVERY DAY (Patient taking differently: Take 10 mEq by mouth daily. ) 90 tablet 0  . QUEtiapine (SEROQUEL) 25 MG tablet Take 25 mg by mouth 2 (two) times daily.    . sertraline (ZOLOFT) 100 MG tablet TAKE TWO TABLETS AT BEDTIME (Patient taking differently: Take 200 mg by mouth daily. ) 60 tablet 3    Musculoskeletal: Strength & Muscle Tone: decreased Gait & Station: unsteady Patient leans: N/A  Psychiatric Specialty Exam: Physical Exam Vitals and nursing note reviewed.  Constitutional:      Appearance: She is well-developed.  HENT:     Head: Normocephalic and atraumatic.  Eyes:     Conjunctiva/sclera: Conjunctivae normal.     Pupils: Pupils are equal, round, and reactive to light.  Cardiovascular:     Heart sounds: Normal heart sounds.  Pulmonary:     Effort: Pulmonary effort is normal.  Abdominal:     Palpations: Abdomen is soft.  Musculoskeletal:        General: Normal range of motion.     Cervical back: Normal range  of motion.  Skin:    General: Skin is warm and dry.  Neurological:     Mental Status: She is alert.     Comments: Decreased strength and great difficulty ambulating  Psychiatric:        Attention and Perception: Attention normal.        Mood and Affect: Mood is anxious and depressed.        Speech: Speech is delayed.        Behavior: Behavior is slowed.        Thought Content: Thought content is paranoid and delusional. Thought content includes suicidal ideation. Thought content includes suicidal plan.         Cognition and Memory: Cognition normal.        Judgment: Judgment is inappropriate.     Review of Systems  Constitutional: Negative.   HENT: Positive for hearing loss.   Eyes: Negative.   Respiratory: Negative.   Cardiovascular: Negative.   Gastrointestinal: Negative.   Musculoskeletal: Negative.   Skin: Negative.   Neurological: Negative.   Psychiatric/Behavioral: Positive for confusion, dysphoric mood and suicidal ideas.    Blood pressure (!) 134/54, pulse 87, temperature 99.4 F (37.4 C), temperature source Oral, resp. rate 20, height 5\' 3"  (1.6 m), weight 73.1 kg, SpO2 96 %.Body mass index is 28.55 kg/m.  General Appearance: Casual  Eye Contact:  Fair  Speech:  Slow  Volume:  Decreased  Mood:  Depressed  Affect:  Congruent  Thought Process:  Coherent  Orientation:  Full (Time, Place, and Person)  Thought Content:  Delusions and Paranoid Ideation  Suicidal Thoughts:  Yes.  with intent/plan  Homicidal Thoughts:  No  Memory:  Immediate;   Fair Recent;   Poor Remote;   Poor  Judgement:  Impaired  Insight:  Shallow  Psychomotor Activity:  Decreased  Concentration:  Concentration: Poor  Recall:  [place] of Knowledge:  Fair  Language:  Fair  Akathisia:  No  Handed:  Right  AIMS (if indicated):     Assets:  Desire for Improvement Resilience  ADL's:  Impaired  Cognition:  Impaired,  Mild  Sleep:        Treatment Plan Summary: Daily contact with patient to assess and evaluate symptoms and progress in treatment, Medication management and Plan 84 year old patient with a past history of mental health problems presents with symptoms of depression accompanied by his psychotic symptoms.  Continues to express paranoid thoughts and to appear to respond to internal stimuli at times however she is certainly not delirious and is capable of holding a reasonably lucid conversation and participating in treatment.  Patient continues to be appropriate for inpatient treatment at a  geriatric psychiatry facility.  IVC papers have been renewed based on continued suicidal thought.  We are hoping that we may be able to find appropriate disposition for her today.  No change at this point in medication if she is likely to be transferred today.  Patient advised of the plan and is agreeable.  Disposition: Recommend psychiatric Inpatient admission when medically cleared.  Alethia Berthold, MD 09/22/2020 10:41 AM

## 2020-09-22 NOTE — ED Notes (Signed)
Helped assist patient with lunch patient ate 75% of her meal at this time.

## 2020-09-22 NOTE — ED Notes (Signed)
Patient was checked and turned on her right by myself and nurse Sonjia at this time.

## 2020-09-22 NOTE — BH Assessment (Signed)
TTS faxed requested information to [place] at 12:30pm.  TTS attempted to follow up with [facility] but received no answer. TTS will continue to follow up accordingly.

## 2020-09-22 NOTE — ED Notes (Signed)
Dr.Clapacs at bedside  

## 2020-09-22 NOTE — ED Notes (Addendum)
Patient bathed and turn on her left side at this time by myself and [employer]

## 2020-09-22 NOTE — ED Notes (Signed)
Breakfast tray was given with juice. 

## 2020-09-22 NOTE — ED Notes (Signed)
Report given to Bill RN

## 2020-09-22 NOTE — ED Notes (Signed)
Patient is hard of hearing. Patient able to give name and birthdate accurately.

## 2020-09-23 NOTE — ED Notes (Signed)
Pt brief changed by this RN and gabby, NT. Pt repositioned in bed.

## 2020-09-23 NOTE — ED Notes (Signed)
Pt wasn't able to eat breakfast tray. Apple sauce given.

## 2020-09-23 NOTE — ED Notes (Signed)
Patient is asleep and resting comfortably. Vs were not obtain at this moment.

## 2020-09-23 NOTE — ED Notes (Signed)
Pt discharged at 1002. Patient still in system due to technical difficulties.

## 2020-09-23 NOTE — Discharge Instructions (Signed)
Bipolar 1 Disorder Bipolar 1 disorder is a mental health disorder in which a person has episodes of emotional highs, or mania, and may also have episodes of lows, or depression. Bipolar 1 disorder is different from other bipolar disorders in that it involves extreme episodes of mania (manic episodes). These episodes last at least one week or involve symptoms that are so severe that hospitalization is needed to keep the person safe. What are the causes? The cause of this condition is not known. What increases the risk? The following factors may make you more likely to develop this condition:  Having a family member with the disorder.  Having an imbalance of certain chemicals in the brain (neurotransmitters).  Experiencing stress, such as illness, financial problems, or a death.  Having certain conditions that affect the brain or spinal cord (neurologic conditions).  Having had a brain injury (trauma). What are the signs or symptoms? Symptoms of mania include:  Very high self-esteem or self-confidence.  Decreased need for sleep.  Unusual talkativeness. Speech may be very fast.  Racing thoughts with quick shifts between topics that may or may not be related (flight of ideas).  Ability to concentrate either greatly improved or decreased.  Increased purposeful activity, such as work, studies, or social activity.  Increased agitation. This could be pacing, squirming, fidgeting, or finger and toe tapping.  Impulsive behavior and poor judgment. This may result in high-risk activities that are sexual, financial, or physical. Symptoms of depression include:  Extreme degrees of sadness, uncontrollable crying, hopelessness, worthlessness, or numbness.  Sleep problems, such as insomnia, waking early, or sleeping too much.  No longer enjoying things you used to enjoy.  Isolation. You may often spend time alone.  Lack of energy or motivation, and moving more slowly than  normal.  Trouble making decisions.  Increased appetite or loss of appetite.  Thoughts of death, or wanting to harm yourself. Sometimes, you may have a mixed mood. This means having symptoms of mania and depression at the same time. Stress can often trigger these symptoms. How is this diagnosed? This condition may be diagnosed based on:  Emotional episodes.  Medical history.  Use of alcohol, drugs, and prescription medicines. Certain medical conditions and substances can cause symptoms that seem like bipolar disorder. This is called secondary bipolar disorder. Your health care provider may ask you to take a short test. This helps to understand your symptoms. You may also be asked to see a mental health specialist to follow up on this diagnosis and start treatment. How is this treated?     This condition is a long-term (chronic) illness. It is often managed with ongoing treatment rather than treatment only when symptoms occur. A combination of treatments is the main approach. Treatment may include:  Medicine. Medicine can be prescribed by a health care provider who specializes in treating mental health disorders (psychiatrist). Medicines called mood stabilizers are usually prescribed. If symptoms occur during treatment with a mood stabilizer, other medicines may be added.  Psychotherapy. Some forms of talk therapy, such as cognitive behavioral therapy (CBT) and family therapy, can help with learning to manage bipolar disorder.  Psychoeducation. This helps you and others understand how this disorder is managed. Include friends and family in educational sessions so they learn how best to support you.  Methods of managing your condition, such as journaling or relaxation exercises. Relaxation exercises include: ? Yoga. ? Meditation. ? Deep breathing.  Lifestyle changes, such as: ? Limiting alcohol and drug use. ?   Exercising regularly. ? Structuring when you go to bed and get  up. ? Eating a healthy diet.  Electroconvulsive therapy (ECT). This is a procedure in which electricity is applied to the brain through the scalp. It may be used in cases of severe bipolar disorder when medicine and psychotherapy work too slowly or do not work. Follow these instructions at home: Activity  Return to your normal activities as told by your health care provider.  Find activities that you enjoy, and make time to do them.  Exercise regularly as told by your health care provider. Lifestyle   Follow a set schedule for eating and sleeping.  Eat a healthy diet that includes fresh fruits and vegetables, whole grains, low-fat dairy, and lean meat.  Get at least 7-8 hours of sleep each night.  Avoid using products that contain nicotine or tobacco. If you want help quitting, ask your health care provider.  Do not use drugs. Alcohol use  Do not drink alcohol if: ? Your health care provider tells you not to drink. ? You are pregnant, may be pregnant, or are planning to become pregnant.  If you drink alcohol: ? Limit how much you use to:  0-1 drink a day for women.  0-2 drinks a day for men. ? Be aware of how much alcohol is in your drink. In the [place] one drink equals one 12 oz bottle of beer (355 mL), one 5 oz glass of wine (148 mL), or one 1 oz glass of hard liquor (44 mL). General instructions  Take over-the-counter and prescription medicines only as told by your health care provider. You may think about stopping your medicine, but it is very important to take all your medicine as prescribed.  Consider joining a support group. Your health care provider may be able to recommend one.  Talk with your family and friends about your treatment goals and how they can help.  Keep all follow-up visits as told by your health care provider. This is important. Where to find more information  National [employer] on Mental Illness: www.nami.org  National Institute of Mental  Health: www.nimh.nih.gov Contact a health care provider if:  Your symptoms get worse, or your loved ones tell you that your symptoms are getting worse.  You have uncomfortable side effects from your medicine.  You have trouble sleeping.  You have trouble doing daily activities.  You feel unsafe in your surroundings.  You are self-medicating with alcohol or drugs. Get help right away if:  You have new symptoms.  You have thoughts about harming yourself or others.  You are considering suicide. If you ever feel like you may hurt yourself or others, or have thoughts about taking your own life, get help right away. You can go to your nearest emergency department or call:  Your local emergency services (911 in the [place]  A suicide crisis helpline, such as the National Suicide Prevention Lifeline at 1-800-273-8255. This is open 24 hours a day. Summary  Bipolar 1 disorder is a lifelong mental health disorder in which a person has episodes of mania and depression.  This disorder is mainly treated with a combination of medicines, talk and behavioral therapies, and, often, electroconvulsive therapy (ECT).  Include friends and family in educational sessions so they know how best to support you.  Get help right away if you are considering suicide. This information is not intended to replace advice given to you by your health care provider. Make sure you discuss any questions you   have with your health care provider. Document Revised: 03/27/2019 Document Reviewed: 03/27/2019 [employer] Patient Education  2020 [employer]  

## 2020-10-03 ENCOUNTER — Encounter
Admission: RE | Admit: 2020-10-03 | Discharge: 2020-10-03 | Disposition: A | Discharge status: Home / Self Care | Payer: Medicare Other | Source: Ambulatory Visit | Attending: Internal Medicine | Admitting: Internal Medicine

## 2020-10-20 ENCOUNTER — Other Ambulatory Visit
Admission: RE | Admit: 2020-10-20 | Discharge: 2020-10-20 | Disposition: A | Discharge status: Home / Self Care | Payer: Medicare Other | Source: Skilled Nursing Facility | Attending: Internal Medicine | Admitting: Internal Medicine

## 2020-10-20 DIAGNOSIS — K922 Gastrointestinal hemorrhage, unspecified: Secondary | ICD-10-CM | POA: Insufficient documentation

## 2020-10-20 LAB — CBC WITH DIFFERENTIAL/PLATELET
Abs Immature Granulocytes: 0.06 10*3/uL (ref 0.00–0.07)
Basophils Absolute: 0.1 10*3/uL (ref 0.0–0.1)
Basophils Relative: 1 %
Eosinophils Absolute: 0.3 10*3/uL (ref 0.0–0.5)
Eosinophils Relative: 3 %
HCT: 31.1 % — ABNORMAL LOW (ref 36.0–46.0)
Hemoglobin: 8.8 g/dL — ABNORMAL LOW (ref 12.0–15.0)
Immature Granulocytes: 1 %
Lymphocytes Relative: 17 %
Lymphs Abs: 1.5 10*3/uL (ref 0.7–4.0)
MCH: 24.1 pg — ABNORMAL LOW (ref 26.0–34.0)
MCHC: 28.3 g/dL — ABNORMAL LOW (ref 30.0–36.0)
MCV: 85.2 fL (ref 80.0–100.0)
Monocytes Absolute: 0.9 10*3/uL (ref 0.1–1.0)
Monocytes Relative: 11 %
Neutro Abs: 5.9 10*3/uL (ref 1.7–7.7)
Neutrophils Relative %: 67 %
Platelets: 303 10*3/uL (ref 150–400)
RBC: 3.65 MIL/uL — ABNORMAL LOW (ref 3.87–5.11)
RDW: 17.4 % — ABNORMAL HIGH (ref 11.5–15.5)
WBC: 8.7 10*3/uL (ref 4.0–10.5)
nRBC: 0 % (ref 0.0–0.2)

## 2020-11-25 DEATH — deceased

## 2021-10-14 ENCOUNTER — Other Ambulatory Visit: Payer: Self-pay
